# Patient Record
Sex: Male | Born: 1945 | Race: White | Hispanic: No | Marital: Married | Smoking: Never smoker
Health system: Southern US, Community
[De-identification: ages and names within clinical notes are randomized; demographics above are authoritative.]

## PROBLEM LIST (undated history)

## (undated) DIAGNOSIS — Z87442 Personal history of urinary calculi: Secondary | ICD-10-CM

## (undated) DIAGNOSIS — E079 Disorder of thyroid, unspecified: Secondary | ICD-10-CM

## (undated) DIAGNOSIS — H269 Unspecified cataract: Secondary | ICD-10-CM

## (undated) DIAGNOSIS — E039 Hypothyroidism, unspecified: Secondary | ICD-10-CM

## (undated) DIAGNOSIS — I10 Essential (primary) hypertension: Secondary | ICD-10-CM

## (undated) HISTORY — DX: Unspecified cataract: H26.9

## (undated) HISTORY — DX: Essential (primary) hypertension: I10

## (undated) HISTORY — PX: EYE SURGERY: SHX253

## (undated) HISTORY — DX: Disorder of thyroid, unspecified: E07.9

## (undated) HISTORY — PX: OTHER SURGICAL HISTORY: SHX169

---

## 1996-09-26 HISTORY — PX: HERNIA REPAIR: SHX51

## 2006-05-24 ENCOUNTER — Ambulatory Visit: Payer: Self-pay | Admitting: Unknown Physician Specialty

## 2007-06-28 ENCOUNTER — Ambulatory Visit: Payer: Self-pay

## 2007-06-28 ENCOUNTER — Other Ambulatory Visit: Payer: Self-pay

## 2009-06-17 ENCOUNTER — Ambulatory Visit: Payer: Self-pay | Admitting: Unknown Physician Specialty

## 2013-12-14 ENCOUNTER — Observation Stay: Payer: Self-pay | Admitting: Internal Medicine

## 2013-12-14 LAB — CBC WITH DIFFERENTIAL/PLATELET
BASOS PCT: 1.9 %
Basophil #: 0.2 10*3/uL — ABNORMAL HIGH (ref 0.0–0.1)
Eosinophil #: 0 10*3/uL (ref 0.0–0.7)
Eosinophil %: 0.1 %
HCT: 45.5 % (ref 40.0–52.0)
HGB: 15 g/dL (ref 13.0–18.0)
Lymphocyte #: 0.5 10*3/uL — ABNORMAL LOW (ref 1.0–3.6)
Lymphocyte %: 5.1 %
MCH: 27.8 pg (ref 26.0–34.0)
MCHC: 32.8 g/dL (ref 32.0–36.0)
MCV: 85 fL (ref 80–100)
MONO ABS: 0.9 x10 3/mm (ref 0.2–1.0)
MONOS PCT: 8.2 %
NEUTROS ABS: 9 10*3/uL — AB (ref 1.4–6.5)
Neutrophil %: 84.7 %
PLATELETS: 129 10*3/uL — AB (ref 150–440)
RBC: 5.38 10*6/uL (ref 4.40–5.90)
RDW: 14.6 % — ABNORMAL HIGH (ref 11.5–14.5)
WBC: 10.6 10*3/uL (ref 3.8–10.6)

## 2013-12-14 LAB — COMPREHENSIVE METABOLIC PANEL
ALT: 21 U/L (ref 12–78)
Albumin: 3.7 g/dL (ref 3.4–5.0)
Alkaline Phosphatase: 46 U/L
Anion Gap: 4 — ABNORMAL LOW (ref 7–16)
BUN: 20 mg/dL — ABNORMAL HIGH (ref 7–18)
Bilirubin,Total: 0.3 mg/dL (ref 0.2–1.0)
CHLORIDE: 108 mmol/L — AB (ref 98–107)
Calcium, Total: 8.3 mg/dL — ABNORMAL LOW (ref 8.5–10.1)
Co2: 26 mmol/L (ref 21–32)
Creatinine: 1.45 mg/dL — ABNORMAL HIGH (ref 0.60–1.30)
EGFR (African American): 58 — ABNORMAL LOW
EGFR (Non-African Amer.): 50 — ABNORMAL LOW
GLUCOSE: 113 mg/dL — AB (ref 65–99)
OSMOLALITY: 279 (ref 275–301)
Potassium: 3.9 mmol/L (ref 3.5–5.1)
SGOT(AST): 20 U/L (ref 15–37)
SODIUM: 138 mmol/L (ref 136–145)
Total Protein: 7 g/dL (ref 6.4–8.2)

## 2013-12-14 LAB — URINALYSIS, COMPLETE
Bacteria: NONE SEEN
Bilirubin,UR: NEGATIVE
Blood: NEGATIVE
Glucose,UR: NEGATIVE mg/dL (ref 0–75)
KETONE: NEGATIVE
LEUKOCYTE ESTERASE: NEGATIVE
NITRITE: NEGATIVE
Ph: 5 (ref 4.5–8.0)
Protein: NEGATIVE
RBC,UR: 2 /HPF (ref 0–5)
Specific Gravity: 1.021 (ref 1.003–1.030)
Squamous Epithelial: <1

## 2013-12-14 LAB — RAPID INFLUENZA A&B ANTIGENS (ARMC ONLY)

## 2013-12-14 LAB — TROPONIN I

## 2013-12-15 LAB — CBC WITH DIFFERENTIAL/PLATELET
BASOS ABS: 0 10*3/uL (ref 0.0–0.1)
BASOS PCT: 0.2 %
Eosinophil #: 0 10*3/uL (ref 0.0–0.7)
Eosinophil %: 0 %
HCT: 43.1 % (ref 40.0–52.0)
HGB: 13.9 g/dL (ref 13.0–18.0)
Lymphocyte #: 0.5 10*3/uL — ABNORMAL LOW (ref 1.0–3.6)
Lymphocyte %: 3.8 %
MCH: 27.3 pg (ref 26.0–34.0)
MCHC: 32.3 g/dL (ref 32.0–36.0)
MCV: 84 fL (ref 80–100)
MONO ABS: 0.9 x10 3/mm (ref 0.2–1.0)
Monocyte %: 7.6 %
Neutrophil #: 10.9 10*3/uL — ABNORMAL HIGH (ref 1.4–6.5)
Neutrophil %: 88.4 %
PLATELETS: 127 10*3/uL — AB (ref 150–440)
RBC: 5.11 10*6/uL (ref 4.40–5.90)
RDW: 15.1 % — ABNORMAL HIGH (ref 11.5–14.5)
WBC: 12.4 10*3/uL — AB (ref 3.8–10.6)

## 2013-12-15 LAB — BASIC METABOLIC PANEL
Anion Gap: 8 (ref 7–16)
BUN: 18 mg/dL (ref 7–18)
CO2: 23 mmol/L (ref 21–32)
Calcium, Total: 7.4 mg/dL — ABNORMAL LOW (ref 8.5–10.1)
Chloride: 107 mmol/L (ref 98–107)
Creatinine: 1.44 mg/dL — ABNORMAL HIGH (ref 0.60–1.30)
EGFR (African American): 58 — ABNORMAL LOW
EGFR (Non-African Amer.): 50 — ABNORMAL LOW
GLUCOSE: 107 mg/dL — AB (ref 65–99)
Osmolality: 278 (ref 275–301)
POTASSIUM: 3.5 mmol/L (ref 3.5–5.1)
Sodium: 138 mmol/L (ref 136–145)

## 2013-12-15 LAB — TSH: Thyroid Stimulating Horm: 0.555 u[IU]/mL

## 2013-12-19 LAB — CULTURE, BLOOD (SINGLE)

## 2015-01-17 NOTE — H&P (Signed)
PATIENT NAME:  Gregory Mckay, Gregory Mckay MR#:  [id number] DATE OF BIRTH:  14-Mar-1946  DATE OF ADMISSION:  12/14/2013  REFERRING PHYSICIAN: Dr. Benjaman Lobe.   FAMILY PHYSICIAN: Nonlocal.   REASON FOR ADMISSION: Dehydration with fever.   HISTORY OF PRESENT ILLNESS: The patient is a 69 year old male with a history of hypothyroidism and benign hypertension, who presented to the Emergency Room with fever and cough and shortness of breath for 2 days. In the Emergency Room, the patient was noted to be dehydrated and febrile with borderline hypoxia. Chest x-ray did not show an obvious infiltrate. Denies tobacco abuse. No history of asthma. He is now admitted for further evaluation.   PAST MEDICAL HISTORY: 1.  Hypothyroidism. 2.  Benign hypertension.   MEDICATIONS: 1.  Lotrel 5/10, 1 p.o. daily.  2.  Lasix 20 mg p.o. daily.  3.  Armour Thyroid 30 mg p.o. daily.   ALLERGIES: No known drug allergies.   SOCIAL HISTORY: The patient is married. No history of alcohol or tobacco abuse.   FAMILY HISTORY: Positive for hypertension, coronary artery disease, but otherwise unremarkable.   REVIEW OF SYSTEMS:    CONSTITUTIONAL: The patient has had fever but no change in weight.  EYES: No blurred or double vision. No glaucoma.  EARS, NOSE, THROAT: No tinnitus or hearing loss. No nasal discharge or bleeding. No difficulty swallowing.  RESPIRATORY: The patient has had cough and wheezing but no hemoptysis. No painful respiration.  CARDIOVASCULAR: No chest pain or orthopnea. No palpitations or syncope.  GASTROINTESTINAL: No nausea, vomiting,  or diarrhea. No abdominal pain. No change in bowel habits.  GENITOURINARY: No dysuria or hematuria. No incontinence.  ENDOCRINE: No polyuria or polydipsia. No heat or cold intolerance.  HEMATOLOGIC: The patient denies anemia, easy bruising or bleeding.  LYMPHATIC: No swollen glands.  MUSCULOSKELETAL: The patient denies pain in his neck, back, shoulders, knees, or hips. No gout.   NEUROLOGIC: No numbness or migraines. Denies stroke or seizures.  PSYCHIATRIC: The patient denies anxiety, insomnia, or depression.   PHYSICAL EXAMINATION: GENERAL: The patient is in no acute distress.  VITAL SIGNS: Initially remarkable for a blood pressure of 124/61 with a heart rate of 97,  respiratory rate of 18, temperature of 101.9.  HEENT: Normocephalic, atraumatic. Pupils equally round and reactive to light and accommodation. Extraocular movements are intact. Sclerae are anicteric. Conjunctivae are clear. Oropharynx is dry and erythematous without exudate.  NECK: Supple without adenopathy. No thyromegaly is noted.  LUNGS: Scattered rhonchi without wheezes or rales. No dullness. Respiratory effort is normal.  CARDIAC: Regular rate and rhythm. Normal S1, S2. No significant rubs, murmurs, or gallops. PMI is nondisplaced. Chest wall is nontender.  ABDOMEN: Soft, nontender, with normoactive bowel sounds. No organomegaly or masses were appreciated. No hernias or bruits were noted.  EXTREMITIES: Without clubbing, cyanosis, or edema. Pulses were 2+ bilaterally.  SKIN: Warm and dry without rash or lesions.  NEUROLOGIC: Cranial nerves II through XII grossly intact. Deep tendon reflexes were symmetric. Motor and sensory exams nonfocal.  PSYCHIATRIC: Revealed a patient who is alert and oriented to person, place, and time. He was cooperative and used good judgment.   LABORATORY, DIAGNOSTIC, AND RADIOLOGICAL DATA: EKG revealed sinus rhythm with no acute ischemic changes. Chest x-ray revealed some scarring or atelectasis in the lingular region with no obvious infiltrate. Flu swab negative. White count was 10.6 with a hemoglobin of 15.0. His glucose was 113 with a BUN of 20, creatinine of 1.45 with a GFR of 50.   ASSESSMENT:  1.  Acute respiratory distress.  2.  Presumed acute bronchitis.  3.  Borderline hypoxia.  4.  Dehydration.  5.  Acute renal insufficiency.  6.  Hyperglycemia.   PLAN: The  patient will be observed with IV fluids and IV antibiotics. Will send off sputum and blood cultures. Will use oxygen as needed for sats under 92%. Continue blood pressure medicine for now but hold Lasix. Follow-up chest x-ray and labs in the morning. Further treatment and evaluation will depend upon the patient's progress.   TOTAL TIME SPENT ON THIS PATIENT: 45 minutes.    ____________________________ Gregory Douglas Doy Hutching, MD jds:jcm D: 12/14/2013 13:32:17 ET T: 12/14/2013 13:57:51 ET JOB#: [id number]  cc: Gregory Douglas. Doy Hutching, MD, <Dictator> Gracee Ratterree Lennice Sites MD ELECTRONICALLY SIGNED 12/14/2013 15:28

## 2015-01-17 NOTE — Discharge Summary (Signed)
PATIENT NAME:  Gregory Mckay, Gregory Mckay MR#:  [id number] DATE OF BIRTH:  [birth year]  DATE OF ADMISSION:  12/14/2013 DATE OF DISCHARGE:  12/15/2013  PRIMARY CARE PHYSICIAN: Nonlocal.   FINAL DIAGNOSES: 1. Acute respiratory failure, resolved.  2. Systemic inflammatory response syndrome and bronchitis.  3. Renal insufficiency, unclear if acute versus chronic.  4. Hypertension.  5. Hypothyroidism.   MEDICATIONS ON DISCHARGE: Include Lotrel 5/10 mg 1 capsule daily, levofloxacin 500 mg 1 tablet daily for 9 more days, thyroid desiccated 30 mg 1 tablet daily, cough syrup chlorpheniramine/hydrocodone 8 mg/10 mg/5 mL oral suspension extended release 5 mL every 12 hours as needed for cough. Stop taking Lasix and creatine.  HOME HEALTH: None.   DIET: Low-sodium diet, regular consistency.   ACTIVITY: As tolerated.   FOLLOWUP: Follow up in 1 to 2 weeks with your medical doctor.   HOSPITAL COURSE: The patient was admitted 12/14/2013 and discharged 12/15/2013, was brought in for an observation for dehydration and fever, admitted with acute respiratory distress, borderline hypoxia, bronchitis, renal insufficiency.   LABORATORY AND RADIOLOGICAL DATA DURING THE HOSPITAL COURSE: Included a troponin that was negative. Glucose 113, BUN 20, creatinine 1.45. Sodium 138, potassium 3.9, chloride 108, CO2 of 26, calcium 8.3. Liver function tests normal range. White blood cell count 10.6, hemoglobin and hematocrit 15 and 45.5, platelet count of 129,000. Chest x-ray showed no abnormality. Influenza A and B negative. Blood cultures negative. Urinalysis negative. TSH 0.55. On 12/15/2013, pulse oximetry on room air 91%. White count upon discharge 12.4, hemoglobin 13.9, platelet count of 127,000, creatinine same at 1.44 with a BUN of 18.   HOSPITAL COURSE PER PROBLEM LIST:  1. Acute respiratory failure. The patient's breathing was fine. No hypoxia upon discharge, pulse oximetry 94% on upon ambulation upon discharge.  2. Systemic  inflammatory response syndrome with fever, white count, believed secondary to bronchitis. Finish up the course of Levaquin.  3. Renal insufficiency, unclear if acute versus chronic. Secondary to not improving much with IV fluid hydration overnight, recommend following up as outpatient. Hold Lasix and creatine at this point.  4. Hypertension. Lotrel 5/10 mg 1 tablet daily.  5. Hypothyroidism. The patient is on thyroid desiccated 30 mg daily. TSH in the normal range.   TIME SPENT ON DISCHARGE: 35 minutes.    ____________________________ Tana Conch. Leslye Peer, MD rjw:lm D: 12/15/2013 15:43:51 ET T: 12/16/2013 05:55:01 ET JOB#: [id number]  cc: Tana Conch. Leslye Peer, MD, <Dictator> Marisue Brooklyn MD ELECTRONICALLY SIGNED 12/20/2013 16:27

## 2015-02-05 ENCOUNTER — Encounter: Payer: Self-pay | Admitting: Emergency Medicine

## 2015-02-05 ENCOUNTER — Emergency Department
Admission: EM | Admit: 2015-02-05 | Discharge: 2015-02-05 | Disposition: A | Discharge status: Home / Self Care | Payer: Medicare Other | Attending: Emergency Medicine | Admitting: Emergency Medicine

## 2015-02-05 ENCOUNTER — Emergency Department: Payer: Medicare Other

## 2015-02-05 DIAGNOSIS — Z79899 Other long term (current) drug therapy: Secondary | ICD-10-CM | POA: Diagnosis not present

## 2015-02-05 DIAGNOSIS — N390 Urinary tract infection, site not specified: Secondary | ICD-10-CM | POA: Insufficient documentation

## 2015-02-05 DIAGNOSIS — K8020 Calculus of gallbladder without cholecystitis without obstruction: Secondary | ICD-10-CM | POA: Insufficient documentation

## 2015-02-05 DIAGNOSIS — R109 Unspecified abdominal pain: Secondary | ICD-10-CM | POA: Diagnosis present

## 2015-02-05 LAB — COMPREHENSIVE METABOLIC PANEL
ALT: 15 U/L — ABNORMAL LOW (ref 17–63)
AST: 18 U/L (ref 15–41)
Albumin: 4.3 g/dL (ref 3.5–5.0)
Alkaline Phosphatase: 52 U/L (ref 38–126)
Anion gap: 8 (ref 5–15)
BILIRUBIN TOTAL: 0.3 mg/dL (ref 0.3–1.2)
BUN: 27 mg/dL — ABNORMAL HIGH (ref 6–20)
CALCIUM: 9.7 mg/dL (ref 8.9–10.3)
CO2: 29 mmol/L (ref 22–32)
CREATININE: 1.46 mg/dL — AB (ref 0.61–1.24)
Chloride: 103 mmol/L (ref 101–111)
GFR calc Af Amer: 55 mL/min — ABNORMAL LOW (ref >60)
GFR, EST NON AFRICAN AMERICAN: 48 mL/min — AB (ref >60)
Glucose, Bld: 106 mg/dL — ABNORMAL HIGH (ref 65–99)
Potassium: 4.1 mmol/L (ref 3.5–5.1)
Sodium: 140 mmol/L (ref 135–145)
Total Protein: 7.4 g/dL (ref 6.5–8.1)

## 2015-02-05 LAB — CBC WITH DIFFERENTIAL/PLATELET
BASOS ABS: 0.1 10*3/uL (ref 0–0.1)
Basophils Relative: 1 %
Eosinophils Absolute: 0.1 10*3/uL (ref 0–0.7)
Eosinophils Relative: 1 %
HCT: 46.4 % (ref 40.0–52.0)
Hemoglobin: 14.9 g/dL (ref 13.0–18.0)
Lymphocytes Relative: 19 %
Lymphs Abs: 1.9 10*3/uL (ref 1.0–3.6)
MCH: 25.4 pg — ABNORMAL LOW (ref 26.0–34.0)
MCHC: 32 g/dL (ref 32.0–36.0)
MCV: 79.4 fL — ABNORMAL LOW (ref 80.0–100.0)
Monocytes Absolute: 0.9 10*3/uL (ref 0.2–1.0)
Monocytes Relative: 9 %
NEUTROS ABS: 6.9 10*3/uL — AB (ref 1.4–6.5)
NEUTROS PCT: 70 %
Platelets: 195 10*3/uL (ref 150–440)
RBC: 5.84 MIL/uL (ref 4.40–5.90)
RDW: 16 % — AB (ref 11.5–14.5)
WBC: 9.9 10*3/uL (ref 3.8–10.6)

## 2015-02-05 LAB — URINALYSIS COMPLETE WITH MICROSCOPIC (ARMC ONLY)
Bacteria, UA: NONE SEEN
Bilirubin Urine: NEGATIVE
Glucose, UA: NEGATIVE mg/dL
Hgb urine dipstick: NEGATIVE
Ketones, ur: NEGATIVE mg/dL
NITRITE: NEGATIVE
PH: 5 (ref 5.0–8.0)
PROTEIN: NEGATIVE mg/dL
SPECIFIC GRAVITY, URINE: 1.023 (ref 1.005–1.030)
Squamous Epithelial / LPF: NONE SEEN

## 2015-02-05 LAB — LIPASE, BLOOD: Lipase: 38 U/L (ref 22–51)

## 2015-02-05 MED ORDER — OXYCODONE-ACETAMINOPHEN 5-325 MG PO TABS: 1.0000 | ORAL_TABLET | Freq: Four times a day (QID) | ORAL | Status: DC | PRN

## 2015-02-05 MED ORDER — CIPROFLOXACIN HCL 500 MG PO TABS: 500.0000 mg | ORAL_TABLET | Freq: Two times a day (BID) | ORAL | Status: AC

## 2015-02-05 NOTE — ED Provider Notes (Signed)
[facility] Emergency Department Provider Note    Time seen: 1720  I have reviewed the triage vital signs and the nursing notes.   HISTORY  Chief Complaint Flank Pain    HPI Gregory Mckay is a 69 y.o. male who presents here for left flank pain for the last month. Then on and off, dull. Nothing makes it better particularly seems to get worse when he is sitting down at his desk or when he eats. He has not had any other complaints. Currently it is mild to moderate. It is intermittent and  severe     History reviewed. No pertinent past medical history.  There are no active problems to display for this patient.   History reviewed. No pertinent past surgical history.  Current Outpatient Rx  Name  Route  Sig  Dispense  Refill  . amLODipine-benazepril (LOTREL) 5-10 MG per capsule   Oral   Take 1 capsule by mouth daily.           Allergies Review of patient's allergies indicates no known allergies.  No family history on file.  Social History History  Substance Use Topics  . Smoking status: Never Smoker   . Smokeless tobacco: Not on file  . Alcohol Use: No    Review of Systems Constitutional: Negative for fever. Eyes: Negative for visual changes. ENT: Negative for sore throat. Cardiovascular: Negative for chest pain. Respiratory: Negative for shortness of breath. Gastrointestinal: Positive for left flank pain, negative for, vomiting and diarrhea. Genitourinary: Negative for dysuria. Musculoskeletal: Negative for back pain. Skin: Negative for rash. Neurological: Negative for headaches, focal weakness or numbness.  10-point ROS otherwise negative.  ____________________________________________   PHYSICAL EXAM:  VITAL SIGNS: ED Triage Vitals  Enc Vitals Group     BP 02/05/15 1433 148/78 mmHg     Pulse Rate 02/05/15 1433 73     Resp 02/05/15 1433 18     Temp 02/05/15 1433 98 F (36.7 C)     Temp Source 02/05/15 1433 Oral     SpO2  02/05/15 1433 95 %     Weight 02/05/15 1433 195 lb (88.451 kg)     Height 02/05/15 1433 5\' 11"  (1.803 m)     Head Cir --      Peak Flow --      Pain Score 02/05/15 1434 5     Pain Loc --      Pain Edu? --      Excl. in [place]? --     Constitutional: Alert and oriented. Well appearing and in no distress. Eyes: Conjunctivae are normal. PERRL. Normal extraocular movements. ENT   Head: Normocephalic and atraumatic.   Nose: No congestion/rhinnorhea.   Mouth/Throat: Mucous membranes are moist.   Neck: No stridor. Hematological/Lymphatic/Immunilogical: No cervical lymphadenopathy. Cardiovascular: Normal rate, regular rhythm. Normal and symmetric distal pulses are present in all extremities. No murmurs, rubs, or gallops. Respiratory: Normal respiratory effort without tachypnea nor retractions. Breath sounds are clear and equal bilaterally. No wheezes/rales/rhonchi. Gastrointestinal: Soft and nontender. No distention. No abdominal bruits. There is no CVA tenderness. Musculoskeletal: Nontender with normal range of motion in all extremities. No joint effusions.  No lower extremity tenderness nor edema. Neurologic:  Normal speech and language. No gross focal neurologic deficits are appreciated. Speech is normal. No gait instability. Skin:  Skin is warm, dry and intact. No rash noted. Psychiatric: Mood and affect are normal. Speech and behavior are normal. Patient exhibits appropriate insight and judgment.  ____________________________________________  LABS (pertinent positives/negatives)  Labs Reviewed  CBC WITH DIFFERENTIAL/PLATELET - Abnormal; Notable for the following:    MCV 79.4 (*)    MCH 25.4 (*)    RDW 16.0 (*)    Neutro Abs 6.9 (*)    All other components within normal limits  COMPREHENSIVE METABOLIC PANEL - Abnormal; Notable for the following:    Glucose, Bld 106 (*)    BUN 27 (*)    Creatinine, Ser 1.46 (*)    ALT 15 (*)    GFR calc non Af Amer 48 (*)    GFR calc  Af Amer 55 (*)    All other components within normal limits  URINALYSIS COMPLETEWITH MICROSCOPIC ([facility])  - Abnormal; Notable for the following:    Color, Urine YELLOW (*)    APPearance CLEAR (*)    Leukocytes, UA TRACE (*)    All other components within normal limits  LIPASE, BLOOD     ____________________________________________    RADIOLOGY  CT abdomen and pelvis with large gallstone as well as intrarenal stone, and some evidence of UTI and perinephric stranding.  ____________________________________________    ED COURSE  Pertinent labs & imaging results that were available during my care of the patient were reviewed by me and considered in my medical decision making (see chart for details).  Labs unremarkable except for some leukocytes in his urine.  FINAL ASSESSMENT AND PLAN  UTI, cholelithiasis, kidney stone  Plan: Discharge home with Percocet and Cipro, and close follow up with general surgeon mostly for this very large gallstone that we found. The infection should resolve with Cipro here.  Earleen Newport, MD   Earleen Newport, MD 02/05/15 [phone]

## 2015-02-05 NOTE — ED Notes (Signed)
Pt with left side flank pain for one month on and off. Worse in the afternoons after he eats.

## 2015-02-05 NOTE — Discharge Instructions (Signed)
Cholelithiasis Cholelithiasis (also called gallstones) is a form of gallbladder disease in which gallstones form in your gallbladder. The gallbladder is an organ that stores bile made in the liver, which helps digest fats. Gallstones begin as small crystals and slowly grow into stones. Gallstone pain occurs when the gallbladder spasms and a gallstone is blocking the duct. Pain can also occur when a stone passes out of the duct.  RISK FACTORS  Being male.   Having multiple pregnancies. Health care providers sometimes advise removing diseased gallbladders before future pregnancies.   Being obese.  Eating a diet heavy in fried foods and fat.   Being older than 90 years and increasing age.   Prolonged use of medicines containing male hormones.   Having diabetes mellitus.   Rapidly losing weight.   Having a family history of gallstones (heredity).  SYMPTOMS  Nausea.   Vomiting.  Abdominal pain.   Yellowing of the skin (jaundice).   Sudden pain. It may persist from several minutes to several hours.  Fever.   Tenderness to the touch. In some cases, when gallstones do not move into the bile duct, people have no pain or symptoms. These are called "silent" gallstones.  TREATMENT Silent gallstones do not need treatment. In severe cases, emergency surgery may be required. Options for treatment include:  Surgery to remove the gallbladder. This is the most common treatment.  Medicines. These do not always work and may take 6-12 months or more to work.  Shock wave treatment (extracorporeal biliary lithotripsy). In this treatment an ultrasound machine sends shock waves to the gallbladder to break gallstones into smaller pieces that can pass into the intestines or be dissolved by medicine. HOME CARE INSTRUCTIONS   Only take over-the-counter or prescription medicines for pain, discomfort, or fever as directed by your health care provider.   Follow a low-fat diet until  seen again by your health care provider. Fat causes the gallbladder to contract, which can result in pain.   Follow up with your health care provider as directed. Attacks are almost always recurrent and surgery is usually required for permanent treatment.  SEEK IMMEDIATE MEDICAL CARE IF:   Your pain increases and is not controlled by medicines.   You have a fever or persistent symptoms for more than 2-3 days.   You have a fever and your symptoms suddenly get worse.   You have persistent nausea and vomiting.  MAKE SURE YOU:   Understand these instructions.  Will watch your condition.  Will get help right away if you are not doing well or get worse. Document Released: 09/08/2005 Document Revised: 05/15/2013 Document Reviewed: 03/06/2013 [facility] Patient Information 2015 [place], [place]. This information is not intended to replace advice given to you by your health care provider. Make sure you discuss any questions you have with your health care provider.  Urinary Tract Infection Urinary tract infections (UTIs) can develop anywhere along your urinary tract. Your urinary tract is your body's drainage system for removing wastes and extra water. Your urinary tract includes two kidneys, two ureters, a bladder, and a urethra. Your kidneys are a pair of bean-shaped organs. Each kidney is about the size of your fist. They are located below your ribs, one on each side of your spine. CAUSES Infections are caused by microbes, which are microscopic organisms, including fungi, viruses, and bacteria. These organisms are so small that they can only be seen through a microscope. Bacteria are the microbes that most commonly cause UTIs. SYMPTOMS  Symptoms of  UTIs may vary by age and gender of the patient and by the location of the infection. Symptoms in young women typically include a frequent and intense urge to urinate and a painful, burning feeling in the bladder or urethra during urination. Older  women and men are more likely to be tired, shaky, and weak and have muscle aches and abdominal pain. A fever may mean the infection is in your kidneys. Other symptoms of a kidney infection include pain in your back or sides below the ribs, nausea, and vomiting. DIAGNOSIS To diagnose a UTI, your caregiver will ask you about your symptoms. Your caregiver also will ask to provide a urine sample. The urine sample will be tested for bacteria and white blood cells. White blood cells are made by your body to help fight infection. TREATMENT  Typically, UTIs can be treated with medication. Because most UTIs are caused by a bacterial infection, they usually can be treated with the use of antibiotics. The choice of antibiotic and length of treatment depend on your symptoms and the type of bacteria causing your infection. HOME CARE INSTRUCTIONS  If you were prescribed antibiotics, take them exactly as your caregiver instructs you. Finish the medication even if you feel better after you have only taken some of the medication.  Drink enough water and fluids to keep your urine clear or pale yellow.  Avoid caffeine, tea, and carbonated beverages. They tend to irritate your bladder.  Empty your bladder often. Avoid holding urine for long periods of time.  Empty your bladder before and after sexual intercourse.  After a bowel movement, women should cleanse from front to back. Use each tissue only once. SEEK MEDICAL CARE IF:   You have back pain.  You develop a fever.  Your symptoms do not begin to resolve within 3 days. SEEK IMMEDIATE MEDICAL CARE IF:   You have severe back pain or lower abdominal pain.  You develop chills.  You have nausea or vomiting.  You have continued burning or discomfort with urination. MAKE SURE YOU:   Understand these instructions.  Will watch your condition.  Will get help right away if you are not doing well or get worse. Document Released: 06/22/2005 Document  Revised: 03/13/2012 Document Reviewed: 10/21/2011 [facility] Patient Information 2015 [place], [place]. This information is not intended to replace advice given to you by your health care provider. Make sure you discuss any questions you have with your health care provider.

## 2015-03-10 ENCOUNTER — Ambulatory Visit: Payer: Self-pay

## 2015-12-07 DIAGNOSIS — I10 Essential (primary) hypertension: Secondary | ICD-10-CM | POA: Diagnosis not present

## 2015-12-07 DIAGNOSIS — R7989 Other specified abnormal findings of blood chemistry: Secondary | ICD-10-CM | POA: Diagnosis not present

## 2016-04-07 DIAGNOSIS — H27111 Subluxation of lens, right eye: Secondary | ICD-10-CM | POA: Diagnosis not present

## 2016-04-07 DIAGNOSIS — H5213 Myopia, bilateral: Secondary | ICD-10-CM | POA: Diagnosis not present

## 2016-04-07 DIAGNOSIS — H43812 Vitreous degeneration, left eye: Secondary | ICD-10-CM | POA: Diagnosis not present

## 2016-04-08 DIAGNOSIS — R972 Elevated prostate specific antigen [PSA]: Secondary | ICD-10-CM | POA: Diagnosis not present

## 2016-04-08 DIAGNOSIS — E7800 Pure hypercholesterolemia, unspecified: Secondary | ICD-10-CM | POA: Diagnosis not present

## 2016-04-08 DIAGNOSIS — R7989 Other specified abnormal findings of blood chemistry: Secondary | ICD-10-CM | POA: Diagnosis not present

## 2016-06-28 DIAGNOSIS — R7989 Other specified abnormal findings of blood chemistry: Secondary | ICD-10-CM | POA: Diagnosis not present

## 2016-06-28 DIAGNOSIS — E559 Vitamin D deficiency, unspecified: Secondary | ICD-10-CM | POA: Diagnosis not present

## 2016-06-28 DIAGNOSIS — Z131 Encounter for screening for diabetes mellitus: Secondary | ICD-10-CM | POA: Diagnosis not present

## 2016-06-28 DIAGNOSIS — E7800 Pure hypercholesterolemia, unspecified: Secondary | ICD-10-CM | POA: Diagnosis not present

## 2016-08-30 DIAGNOSIS — Z1283 Encounter for screening for malignant neoplasm of skin: Secondary | ICD-10-CM | POA: Diagnosis not present

## 2016-08-30 DIAGNOSIS — Z872 Personal history of diseases of the skin and subcutaneous tissue: Secondary | ICD-10-CM | POA: Diagnosis not present

## 2016-08-30 DIAGNOSIS — L570 Actinic keratosis: Secondary | ICD-10-CM | POA: Diagnosis not present

## 2016-08-30 DIAGNOSIS — Z09 Encounter for follow-up examination after completed treatment for conditions other than malignant neoplasm: Secondary | ICD-10-CM | POA: Diagnosis not present

## 2016-08-30 DIAGNOSIS — D1801 Hemangioma of skin and subcutaneous tissue: Secondary | ICD-10-CM | POA: Diagnosis not present

## 2016-08-30 DIAGNOSIS — L918 Other hypertrophic disorders of the skin: Secondary | ICD-10-CM | POA: Diagnosis not present

## 2016-08-30 DIAGNOSIS — L821 Other seborrheic keratosis: Secondary | ICD-10-CM | POA: Diagnosis not present

## 2016-08-30 DIAGNOSIS — D485 Neoplasm of uncertain behavior of skin: Secondary | ICD-10-CM | POA: Diagnosis not present

## 2016-12-20 DIAGNOSIS — Z23 Encounter for immunization: Secondary | ICD-10-CM | POA: Diagnosis not present

## 2017-01-03 DIAGNOSIS — E291 Testicular hypofunction: Secondary | ICD-10-CM | POA: Diagnosis not present

## 2017-01-04 DIAGNOSIS — E291 Testicular hypofunction: Secondary | ICD-10-CM | POA: Diagnosis not present

## 2017-01-19 DIAGNOSIS — E291 Testicular hypofunction: Secondary | ICD-10-CM | POA: Diagnosis not present

## 2017-01-19 DIAGNOSIS — Z79890 Hormone replacement therapy (postmenopausal): Secondary | ICD-10-CM | POA: Diagnosis not present

## 2017-01-19 DIAGNOSIS — R5382 Chronic fatigue, unspecified: Secondary | ICD-10-CM | POA: Diagnosis not present

## 2017-01-19 DIAGNOSIS — E039 Hypothyroidism, unspecified: Secondary | ICD-10-CM | POA: Diagnosis not present

## 2017-02-13 DIAGNOSIS — Z79890 Hormone replacement therapy (postmenopausal): Secondary | ICD-10-CM | POA: Diagnosis not present

## 2017-02-13 DIAGNOSIS — E291 Testicular hypofunction: Secondary | ICD-10-CM | POA: Diagnosis not present

## 2017-02-13 DIAGNOSIS — N189 Chronic kidney disease, unspecified: Secondary | ICD-10-CM | POA: Diagnosis not present

## 2017-02-13 DIAGNOSIS — D508 Other iron deficiency anemias: Secondary | ICD-10-CM | POA: Diagnosis not present

## 2017-02-14 DIAGNOSIS — N3001 Acute cystitis with hematuria: Secondary | ICD-10-CM | POA: Diagnosis not present

## 2017-02-14 DIAGNOSIS — R350 Frequency of micturition: Secondary | ICD-10-CM | POA: Diagnosis not present

## 2017-02-27 DIAGNOSIS — R319 Hematuria, unspecified: Secondary | ICD-10-CM | POA: Diagnosis not present

## 2017-02-27 DIAGNOSIS — R350 Frequency of micturition: Secondary | ICD-10-CM | POA: Diagnosis not present

## 2017-03-14 DIAGNOSIS — Z79890 Hormone replacement therapy (postmenopausal): Secondary | ICD-10-CM | POA: Diagnosis not present

## 2017-03-14 DIAGNOSIS — E291 Testicular hypofunction: Secondary | ICD-10-CM | POA: Diagnosis not present

## 2017-03-14 DIAGNOSIS — N189 Chronic kidney disease, unspecified: Secondary | ICD-10-CM | POA: Diagnosis not present

## 2017-03-14 DIAGNOSIS — N411 Chronic prostatitis: Secondary | ICD-10-CM | POA: Diagnosis not present

## 2017-03-16 DIAGNOSIS — R5381 Other malaise: Secondary | ICD-10-CM | POA: Diagnosis not present

## 2017-03-16 DIAGNOSIS — R5383 Other fatigue: Secondary | ICD-10-CM | POA: Diagnosis not present

## 2017-03-30 DIAGNOSIS — E291 Testicular hypofunction: Secondary | ICD-10-CM | POA: Diagnosis not present

## 2017-03-31 DIAGNOSIS — E291 Testicular hypofunction: Secondary | ICD-10-CM | POA: Diagnosis not present

## 2017-05-01 DIAGNOSIS — E291 Testicular hypofunction: Secondary | ICD-10-CM | POA: Diagnosis not present

## 2017-05-02 DIAGNOSIS — E291 Testicular hypofunction: Secondary | ICD-10-CM | POA: Diagnosis not present

## 2017-05-02 DIAGNOSIS — R6882 Decreased libido: Secondary | ICD-10-CM | POA: Diagnosis not present

## 2017-05-20 DIAGNOSIS — R799 Abnormal finding of blood chemistry, unspecified: Secondary | ICD-10-CM | POA: Diagnosis not present

## 2017-05-20 DIAGNOSIS — I10 Essential (primary) hypertension: Secondary | ICD-10-CM | POA: Diagnosis not present

## 2017-05-20 DIAGNOSIS — E291 Testicular hypofunction: Secondary | ICD-10-CM | POA: Diagnosis not present

## 2017-05-20 DIAGNOSIS — E039 Hypothyroidism, unspecified: Secondary | ICD-10-CM | POA: Diagnosis not present

## 2017-06-03 DIAGNOSIS — N400 Enlarged prostate without lower urinary tract symptoms: Secondary | ICD-10-CM | POA: Diagnosis not present

## 2017-06-03 DIAGNOSIS — E611 Iron deficiency: Secondary | ICD-10-CM | POA: Diagnosis not present

## 2017-06-03 DIAGNOSIS — R799 Abnormal finding of blood chemistry, unspecified: Secondary | ICD-10-CM | POA: Diagnosis not present

## 2017-07-04 DIAGNOSIS — E291 Testicular hypofunction: Secondary | ICD-10-CM | POA: Diagnosis not present

## 2017-07-05 DIAGNOSIS — R4586 Emotional lability: Secondary | ICD-10-CM | POA: Diagnosis not present

## 2017-07-05 DIAGNOSIS — R6882 Decreased libido: Secondary | ICD-10-CM | POA: Diagnosis not present

## 2017-07-05 DIAGNOSIS — G479 Sleep disorder, unspecified: Secondary | ICD-10-CM | POA: Diagnosis not present

## 2017-07-24 DIAGNOSIS — L738 Other specified follicular disorders: Secondary | ICD-10-CM | POA: Diagnosis not present

## 2017-09-05 DIAGNOSIS — E7210 Disorders of sulfur-bearing amino-acid metabolism, unspecified: Secondary | ICD-10-CM | POA: Diagnosis not present

## 2017-09-05 DIAGNOSIS — N411 Chronic prostatitis: Secondary | ICD-10-CM | POA: Diagnosis not present

## 2017-09-05 DIAGNOSIS — E291 Testicular hypofunction: Secondary | ICD-10-CM | POA: Diagnosis not present

## 2017-10-31 DIAGNOSIS — R4586 Emotional lability: Secondary | ICD-10-CM | POA: Diagnosis not present

## 2017-10-31 DIAGNOSIS — R6882 Decreased libido: Secondary | ICD-10-CM | POA: Diagnosis not present

## 2017-10-31 DIAGNOSIS — G479 Sleep disorder, unspecified: Secondary | ICD-10-CM | POA: Diagnosis not present

## 2017-11-01 DIAGNOSIS — R6882 Decreased libido: Secondary | ICD-10-CM | POA: Diagnosis not present

## 2017-11-01 DIAGNOSIS — E291 Testicular hypofunction: Secondary | ICD-10-CM | POA: Diagnosis not present

## 2018-01-05 DIAGNOSIS — N529 Male erectile dysfunction, unspecified: Secondary | ICD-10-CM | POA: Diagnosis not present

## 2018-01-05 DIAGNOSIS — E509 Vitamin A deficiency, unspecified: Secondary | ICD-10-CM | POA: Diagnosis not present

## 2018-01-05 DIAGNOSIS — E291 Testicular hypofunction: Secondary | ICD-10-CM | POA: Diagnosis not present

## 2018-01-05 DIAGNOSIS — E039 Hypothyroidism, unspecified: Secondary | ICD-10-CM | POA: Diagnosis not present

## 2018-02-05 DIAGNOSIS — E291 Testicular hypofunction: Secondary | ICD-10-CM | POA: Diagnosis not present

## 2018-02-05 DIAGNOSIS — Z79890 Hormone replacement therapy (postmenopausal): Secondary | ICD-10-CM | POA: Diagnosis not present

## 2018-02-05 LAB — PSA: PSA: 8.9

## 2018-02-05 LAB — CBC AND DIFFERENTIAL
HCT: 53 (ref 41–53)
HEMOGLOBIN: 17.8 — AB (ref 13.5–17.5)
Neutrophils Absolute: 3
PLATELETS: 188 (ref 150–399)
WBC: 5.6

## 2018-02-26 DIAGNOSIS — R6882 Decreased libido: Secondary | ICD-10-CM | POA: Diagnosis not present

## 2018-02-26 DIAGNOSIS — E291 Testicular hypofunction: Secondary | ICD-10-CM | POA: Diagnosis not present

## 2018-02-27 ENCOUNTER — Encounter: Payer: Self-pay | Admitting: Family Medicine

## 2018-02-27 ENCOUNTER — Ambulatory Visit (INDEPENDENT_AMBULATORY_CARE_PROVIDER_SITE_OTHER): Payer: Medicare Other | Admitting: Family Medicine

## 2018-02-27 VITALS — BP 164/78 | HR 65 | Temp 97.7°F | Resp 16 | Ht 71.0 in | Wt 196.0 lb

## 2018-02-27 DIAGNOSIS — R972 Elevated prostate specific antigen [PSA]: Secondary | ICD-10-CM | POA: Insufficient documentation

## 2018-02-27 DIAGNOSIS — E039 Hypothyroidism, unspecified: Secondary | ICD-10-CM | POA: Diagnosis not present

## 2018-02-27 DIAGNOSIS — Z23 Encounter for immunization: Secondary | ICD-10-CM | POA: Diagnosis not present

## 2018-02-27 DIAGNOSIS — I10 Essential (primary) hypertension: Secondary | ICD-10-CM | POA: Insufficient documentation

## 2018-02-27 MED ORDER — TADALAFIL 5 MG PO TABS: 5.0000 mg | ORAL_TABLET | Freq: Every day | ORAL | 11 refills | Status: DC

## 2018-02-27 MED ORDER — AMLODIPINE BESY-BENAZEPRIL HCL 5-10 MG PO CAPS: 1.0000 | ORAL_CAPSULE | Freq: Every day | ORAL | 3 refills | Status: DC

## 2018-02-27 NOTE — Progress Notes (Signed)
Patient: Gregory Mckay, Male    DOB: 11/08/1945, 72 y.o.   MRN: [id number] Visit Date: 02/27/2018  Today's Provider: Lavon Paganini, MD   I, Martha Clan, CMA, am acting as scribe for Lavon Paganini, MD.  Chief Complaint  Patient presents with  . Establish Care   Subjective:    Establish Care [name] is a 72 y.o. male who presents today to establish care. His previous PCP was Dr. Chilton Si at [facility]. His PMH includes HTN and hypothyroidism. He feels well. He reports exercising 2-3 times per week with a trainer. He reports he is sleeping well.  Hypothyroidism: Taking Armour Thyroid - unsure of dose.  Good compliance.  Asymptomatic. Thinks he had thyroid levels checked recently.  Has never taken Synthroid.  HTN: Taking amlodipine benazepril at current dose x 25 yrs.  No chest pain, SOB, headaches, vision changes, lower extremity edema.  Diet and exercise as above.  Believes his last creatinine was normal and this was within the last few months at his previous PCP.  S/p cataract surgery and Lasik.  [facility] in [place] gives testosterone pellets q4 months.  Seem to help with energy.  Testosterone level is close to 900s after receiving the pellets and then decreases to 700s.  He states this is when he knows he needs to get.  He brings a copy of recent lab results with him from 02/05/2018 that show PSA of 8.9.  Free PSA 1.51.  And percent free PSA 17% testosterone level 713 and free testosterone 21.2.  He also had other hormonal levels including growth hormone, IGF-I, estradiol, DHEA, dihydrotestosterone, sex hormone binding globulin, and pregnenolone checked.  He states that his PSA has been elevated to this level and stable there for many years.  He was seen by urology several years ago and they recommended a prostate biopsy which she declined.  He states that he will not see urology again and will never get a prostate biopsy.  He was told by his functional  medicine physician that because his percent free PSA is reasonably high at 17% that his risk of prostate cancer was rather small.  The results show that for this percentage free PSA in his age range, his probability of prostate cancer is 23%.  States that he has had 2 colonoscopies in the past.  He is unsure when his last one was.  He believes that his previous PCP will have records of these.  States he will not get another colonoscopy in the future.  Patient also brings a list of 41 different OTC supplements that he states he is taking for antiaging and antioxidant and anti-inflammatory properties.  Copy of these will be scanned into the chart. -----------------------------------------------------------------   Review of Systems  Constitutional: Negative.   HENT: Negative.   Eyes: Negative.   Respiratory: Negative.   Cardiovascular: Negative.   Gastrointestinal: Negative.   Endocrine: Negative.   Genitourinary: Negative.   Musculoskeletal: Negative.   Skin: Negative.   Allergic/Immunologic: Negative.   Neurological: Negative.   Hematological: Negative.   Psychiatric/Behavioral: Negative.     Social History      He  reports that he has never smoked. He has never used smokeless tobacco. He reports that he does not drink alcohol or use drugs.       Social History   Socioeconomic History  . Marital status: Married    Spouse name: Tommie  . Number of children: 2  .  Years of education: Not on file  . Highest education level: Not on file  Occupational History  . Not on file  Social Needs  . Financial resource strain: Not on file  . Food insecurity:    Worry: Not on file    Inability: Not on file  . Transportation needs:    Medical: Not on file    Non-medical: Not on file  Tobacco Use  . Smoking status: Never Smoker  . Smokeless tobacco: Never Used  Substance and Sexual Activity  . Alcohol use: No  . Drug use: No  . Sexual activity: Not on file  Lifestyle  . Physical  activity:    Days per week: Not on file    Minutes per session: Not on file  . Stress: Not on file  Relationships  . Social connections:    Talks on phone: Not on file    Gets together: Not on file    Attends religious service: Not on file    Active member of club or organization: Not on file    Attends meetings of clubs or organizations: Not on file    Relationship status: Not on file  Other Topics Concern  . Not on file  Social History Narrative  . Not on file    Past Medical History:  Diagnosis Date  . Cataract   . Hypertension   . Thyroid disease      There are no active problems to display for this patient.   Past Surgical History:  Procedure Laterality Date  . EYE SURGERY    . HERNIA REPAIR      Family History        Family Status  Relation Name Status  . Mother  (Not Specified)  . Father  (Not Specified)  . Neg Hx  (Not Specified)        His family history includes Healthy in his father and mother. There is no history of Colon cancer or Prostate cancer.      No Known Allergies   Current Outpatient Medications:  .  amLODipine-benazepril (LOTREL) 5-10 MG per capsule, Take 1 capsule by mouth daily., Disp: , Rfl:  .  ARMOUR THYROID PO, Take by mouth., Disp: , Rfl:    Patient Care Team: Virginia Crews, MD as PCP - General (Family Medicine)      Objective:   Vitals: BP (!) 164/78 (BP Location: Left Arm, Patient Position: Sitting, Cuff Size: Large)   Pulse 65   Temp 97.7 F (36.5 C) (Oral)   Resp 16   Ht 5\' 11"  (1.803 m)   Wt 196 lb (88.9 kg)   SpO2 98%   BMI 27.34 kg/m    Vitals:   02/27/18 0919  BP: (!) 164/78  Pulse: 65  Resp: 16  Temp: 97.7 F (36.5 C)  TempSrc: Oral  SpO2: 98%  Weight: 196 lb (88.9 kg)  Height: 5\' 11"  (1.803 m)     Physical Exam  Constitutional: He is oriented to person, place, and time. He appears well-developed and well-nourished. No distress.  HENT:  Head: Normocephalic and atraumatic.  Right Ear:  External ear normal.  Left Ear: External ear normal.  Nose: Nose normal.  Mouth/Throat: Oropharynx is clear and moist.  Eyes: Pupils are equal, round, and reactive to light. Conjunctivae and EOM are normal. No scleral icterus.  Neck: Neck supple. No thyromegaly present.  Cardiovascular: Normal rate, regular rhythm, normal heart sounds and intact distal pulses.  No murmur heard. Pulmonary/Chest: Effort  normal and breath sounds normal. No respiratory distress. He has no wheezes. He has no rales.  Abdominal: Soft. Bowel sounds are normal. He exhibits no distension. There is no tenderness. There is no rebound and no guarding.  Musculoskeletal: He exhibits no edema or deformity.  Lymphadenopathy:    He has no cervical adenopathy.  Neurological: He is alert and oriented to person, place, and time.  Skin: Skin is warm and dry. Capillary refill takes less than 2 seconds. No rash noted.  Psychiatric: He has a normal mood and affect. His behavior is normal.  Vitals reviewed.    Depression Screen PHQ 2/9 Scores 02/27/2018  PHQ - 2 Score 0     Assessment & Plan:    Problem List Items Addressed This Visit      Cardiovascular and Mediastinum   Hypertension - Primary    Uncontrolled today Believes that it is elevated due to this being first visit at new clinic  No changes made to [facility] today Patient believes that he had recent lab work within the last 1 to 2 months, so we will not check BMP today and await records from last PCP that we have requested Recheck at next visit and consider dose titration if needed at that point      Relevant Medications   amLODipine-benazepril (LOTREL) 5-10 MG capsule   tadalafil (CIALIS) 5 MG tablet     Endocrine   Hypothyroidism    Reviewed last TSH which was abstracted into the chart Unclear when patient was diagnosed with hypothyroidism He has never taken Synthroid We will continue his Armour Thyroid, but typically recommend Synthroid over this        Relevant Medications   ARMOUR THYROID PO     Other   Elevated PSA    Discussed with patient that an elevated PSA can be an indication for biopsy this could represent prostate cancer Patient insists that he will not see urology again and he will not undergo possible prostate biopsy discussed that if he does have prostate cancer, testosterone could feet this, but he insisted he will keep getting his testosterone pellets from his integrative medicine practice Plan to monitor PSA annually and if it greatly increases, will discuss again with patient the importance of seeing urology       Other Visit Diagnoses    Need for shingles vaccine       Relevant Orders   Varicella-zoster vaccine IM (Completed)   Need for pneumococcal vaccination       Relevant Orders   Pneumococcal polysaccharide vaccine 23-valent greater than or equal to 2yo subcutaneous/IM (Completed)       Return in about 6 months (around 08/29/2018) for CPE.   The entirety of the information documented in the History of Present Illness, Review of Systems and Physical Exam were personally obtained by me. Portions of this information were initially documented by Raquel Sarna Ratchford, CMA and reviewed by me for thoroughness and accuracy.    Virginia Crews, MD, MPH [facility] 03/01/2018 8:34 AM

## 2018-02-27 NOTE — Assessment & Plan Note (Addendum)
Uncontrolled today Believes that it is elevated due to this being first visit at new clinic  No changes made to [facility] today Patient believes that he had recent lab work within the last 1 to 2 months, so we will not check BMP today and await records from last PCP that we have requested Recheck at next visit and consider dose titration if needed at that point

## 2018-02-27 NOTE — Patient Instructions (Signed)

## 2018-03-01 DIAGNOSIS — R972 Elevated prostate specific antigen [PSA]: Secondary | ICD-10-CM | POA: Diagnosis not present

## 2018-03-01 NOTE — Assessment & Plan Note (Signed)
Discussed with patient that an elevated PSA can be an indication for biopsy this could represent prostate cancer Patient insists that he will not see urology again and he will not undergo possible prostate biopsy discussed that if he does have prostate cancer, testosterone could feet this, but he insisted he will keep getting his testosterone pellets from his integrative medicine practice Plan to monitor PSA annually and if it greatly increases, will discuss again with patient the importance of seeing urology

## 2018-03-01 NOTE — Assessment & Plan Note (Signed)
Reviewed last TSH which was abstracted into the chart Unclear when patient was diagnosed with hypothyroidism He has never taken Synthroid We will continue his Armour Thyroid, but typically recommend Synthroid over this

## 2018-03-03 ENCOUNTER — Telehealth: Payer: Self-pay

## 2018-03-03 NOTE — Telephone Encounter (Signed)
Sounds good.  Routing it back to you for call back next week.  Virginia Crews, MD, MPH [facility] 03/03/2018 9:35 AM

## 2018-03-03 NOTE — Telephone Encounter (Signed)
Tamika with [facility] [employer] called advising [place] that the PA for pt's Cialis has been denied. This medication is excluded from Medicare Part D if associated with eretile dysfunction. Would have been covered with a HTN diagnosis. Tamika states she will inform the pt that this has been denied, and that we will call him following up about this Monday.

## 2018-03-05 ENCOUNTER — Other Ambulatory Visit: Payer: Self-pay

## 2018-03-05 DIAGNOSIS — E039 Hypothyroidism, unspecified: Secondary | ICD-10-CM

## 2018-03-05 MED ORDER — THYROID 60 MG PO TABS: 60.0000 mg | ORAL_TABLET | Freq: Every day | ORAL | 1 refills | Status: DC

## 2018-03-05 NOTE — Telephone Encounter (Signed)
Received fax from after hours service. Needs refill of armor thyroid 60 mg po qd.

## 2018-03-05 NOTE — Telephone Encounter (Signed)
-----   Message from Ola Spurr sent at 03/05/2018  3:16 PM EDT ----- Review incoming fax

## 2018-03-06 NOTE — Telephone Encounter (Signed)
lmtcb

## 2018-03-15 NOTE — Telephone Encounter (Signed)
Pt has not returned call. [employer] was going to contact pt about this. He is seemingly willing to pay for the medication out of pocket.

## 2018-04-30 ENCOUNTER — Ambulatory Visit (INDEPENDENT_AMBULATORY_CARE_PROVIDER_SITE_OTHER): Payer: Medicare Other | Admitting: Family Medicine

## 2018-04-30 DIAGNOSIS — Z23 Encounter for immunization: Secondary | ICD-10-CM

## 2018-04-30 NOTE — Progress Notes (Signed)
Pt presents for 2nd Shingrix only.

## 2018-05-02 DIAGNOSIS — N5201 Erectile dysfunction due to arterial insufficiency: Secondary | ICD-10-CM | POA: Diagnosis not present

## 2018-05-02 DIAGNOSIS — Z79899 Other long term (current) drug therapy: Secondary | ICD-10-CM | POA: Diagnosis not present

## 2018-05-02 DIAGNOSIS — N401 Enlarged prostate with lower urinary tract symptoms: Secondary | ICD-10-CM | POA: Diagnosis not present

## 2018-05-02 DIAGNOSIS — E291 Testicular hypofunction: Secondary | ICD-10-CM | POA: Diagnosis not present

## 2018-05-08 DIAGNOSIS — N401 Enlarged prostate with lower urinary tract symptoms: Secondary | ICD-10-CM | POA: Diagnosis not present

## 2018-05-16 DIAGNOSIS — R972 Elevated prostate specific antigen [PSA]: Secondary | ICD-10-CM | POA: Diagnosis not present

## 2018-05-16 DIAGNOSIS — N401 Enlarged prostate with lower urinary tract symptoms: Secondary | ICD-10-CM | POA: Diagnosis not present

## 2018-05-17 ENCOUNTER — Other Ambulatory Visit (HOSPITAL_COMMUNITY): Payer: Self-pay | Admitting: Orthopedic Surgery

## 2018-05-17 DIAGNOSIS — R972 Elevated prostate specific antigen [PSA]: Secondary | ICD-10-CM

## 2018-05-22 DIAGNOSIS — N401 Enlarged prostate with lower urinary tract symptoms: Secondary | ICD-10-CM | POA: Diagnosis not present

## 2018-05-22 DIAGNOSIS — R972 Elevated prostate specific antigen [PSA]: Secondary | ICD-10-CM | POA: Diagnosis not present

## 2018-05-23 ENCOUNTER — Ambulatory Visit (HOSPITAL_COMMUNITY): Admission: RE | Admit: 2018-05-23 | Payer: Medicare Other | Source: Ambulatory Visit

## 2018-05-30 ENCOUNTER — Ambulatory Visit (HOSPITAL_COMMUNITY)
Admission: RE | Admit: 2018-05-30 | Discharge: 2018-05-30 | Disposition: A | Discharge status: Home / Self Care | Payer: Medicare Other | Source: Ambulatory Visit | Attending: Orthopedic Surgery | Admitting: Orthopedic Surgery

## 2018-05-30 DIAGNOSIS — N400 Enlarged prostate without lower urinary tract symptoms: Secondary | ICD-10-CM | POA: Diagnosis not present

## 2018-05-30 DIAGNOSIS — R972 Elevated prostate specific antigen [PSA]: Secondary | ICD-10-CM | POA: Diagnosis not present

## 2018-05-30 MED ORDER — GADOBENATE DIMEGLUMINE 529 MG/ML IV SOLN
20.0000 mL | Freq: Once | INTRAVENOUS | Status: AC | PRN
  Administered 2018-05-30: 19 mL via INTRAVENOUS

## 2018-06-05 DIAGNOSIS — N401 Enlarged prostate with lower urinary tract symptoms: Secondary | ICD-10-CM | POA: Diagnosis not present

## 2018-06-05 DIAGNOSIS — R972 Elevated prostate specific antigen [PSA]: Secondary | ICD-10-CM | POA: Diagnosis not present

## 2018-06-14 DIAGNOSIS — N401 Enlarged prostate with lower urinary tract symptoms: Secondary | ICD-10-CM | POA: Diagnosis not present

## 2018-06-14 DIAGNOSIS — R972 Elevated prostate specific antigen [PSA]: Secondary | ICD-10-CM | POA: Diagnosis not present

## 2018-06-20 DIAGNOSIS — R350 Frequency of micturition: Secondary | ICD-10-CM | POA: Diagnosis not present

## 2018-06-27 ENCOUNTER — Ambulatory Visit (HOSPITAL_COMMUNITY): Payer: Medicare Other

## 2018-07-03 DIAGNOSIS — R972 Elevated prostate specific antigen [PSA]: Secondary | ICD-10-CM | POA: Diagnosis not present

## 2018-07-03 DIAGNOSIS — N401 Enlarged prostate with lower urinary tract symptoms: Secondary | ICD-10-CM | POA: Diagnosis not present

## 2018-07-16 DIAGNOSIS — R319 Hematuria, unspecified: Secondary | ICD-10-CM | POA: Diagnosis not present

## 2018-07-16 DIAGNOSIS — R1030 Lower abdominal pain, unspecified: Secondary | ICD-10-CM | POA: Diagnosis not present

## 2018-07-18 DIAGNOSIS — R102 Pelvic and perineal pain: Secondary | ICD-10-CM | POA: Diagnosis not present

## 2018-07-18 DIAGNOSIS — N401 Enlarged prostate with lower urinary tract symptoms: Secondary | ICD-10-CM | POA: Diagnosis not present

## 2018-07-18 DIAGNOSIS — R972 Elevated prostate specific antigen [PSA]: Secondary | ICD-10-CM | POA: Diagnosis not present

## 2018-07-19 DIAGNOSIS — H524 Presbyopia: Secondary | ICD-10-CM | POA: Diagnosis not present

## 2018-07-20 ENCOUNTER — Telehealth: Payer: Self-pay

## 2018-07-20 DIAGNOSIS — Z1211 Encounter for screening for malignant neoplasm of colon: Secondary | ICD-10-CM

## 2018-07-20 NOTE — Telephone Encounter (Signed)
Patient calling wanting to pick up stool testing kit on Monday. He says Dr. B wanted him to have this done. Please leave up at the front desk. Thanks!

## 2018-07-23 NOTE — Telephone Encounter (Signed)
I think we must have briefly discussed cologuard.  Maybe he wants [place] to order that for him?  We can do that if he is interested.  Virginia Crews, MD, MPH [facility] 07/23/2018 8:50 AM

## 2018-07-23 NOTE — Telephone Encounter (Signed)
Patient advised. He agreed to proceed with Cologuard. Order placed and faxed to [employer].

## 2018-07-25 ENCOUNTER — Other Ambulatory Visit: Payer: Self-pay | Admitting: Family Medicine

## 2018-07-25 DIAGNOSIS — E039 Hypothyroidism, unspecified: Secondary | ICD-10-CM

## 2018-07-30 LAB — COLOGUARD

## 2018-08-01 ENCOUNTER — Encounter: Payer: Self-pay | Admitting: Emergency Medicine

## 2018-08-01 ENCOUNTER — Other Ambulatory Visit: Payer: Self-pay

## 2018-08-01 ENCOUNTER — Emergency Department
Admission: EM | Admit: 2018-08-01 | Discharge: 2018-08-01 | Discharge status: Against Medical Advice | Payer: Medicare Other | Attending: Emergency Medicine | Admitting: Emergency Medicine

## 2018-08-01 ENCOUNTER — Emergency Department: Payer: Medicare Other

## 2018-08-01 DIAGNOSIS — Z79899 Other long term (current) drug therapy: Secondary | ICD-10-CM | POA: Insufficient documentation

## 2018-08-01 DIAGNOSIS — R110 Nausea: Secondary | ICD-10-CM | POA: Diagnosis not present

## 2018-08-01 DIAGNOSIS — R7989 Other specified abnormal findings of blood chemistry: Secondary | ICD-10-CM | POA: Insufficient documentation

## 2018-08-01 DIAGNOSIS — R0789 Other chest pain: Secondary | ICD-10-CM | POA: Diagnosis not present

## 2018-08-01 DIAGNOSIS — R778 Other specified abnormalities of plasma proteins: Secondary | ICD-10-CM

## 2018-08-01 DIAGNOSIS — I10 Essential (primary) hypertension: Secondary | ICD-10-CM | POA: Insufficient documentation

## 2018-08-01 DIAGNOSIS — E039 Hypothyroidism, unspecified: Secondary | ICD-10-CM | POA: Diagnosis not present

## 2018-08-01 DIAGNOSIS — R079 Chest pain, unspecified: Secondary | ICD-10-CM | POA: Diagnosis not present

## 2018-08-01 LAB — CBC
HEMATOCRIT: 49.7 % (ref 39.0–52.0)
Hemoglobin: 15.2 g/dL (ref 13.0–17.0)
MCH: 25.1 pg — ABNORMAL LOW (ref 26.0–34.0)
MCHC: 30.6 g/dL (ref 30.0–36.0)
MCV: 82 fL (ref 80.0–100.0)
Platelets: 239 10*3/uL (ref 150–400)
RBC: 6.06 MIL/uL — AB (ref 4.22–5.81)
RDW: 14.5 % (ref 11.5–15.5)
WBC: 10.8 10*3/uL — ABNORMAL HIGH (ref 4.0–10.5)
nRBC: 0 % (ref 0.0–0.2)

## 2018-08-01 LAB — BASIC METABOLIC PANEL
ANION GAP: 8 (ref 5–15)
BUN: 25 mg/dL — ABNORMAL HIGH (ref 8–23)
CHLORIDE: 102 mmol/L (ref 98–111)
CO2: 29 mmol/L (ref 22–32)
Calcium: 9.7 mg/dL (ref 8.9–10.3)
Creatinine, Ser: 1.5 mg/dL — ABNORMAL HIGH (ref 0.61–1.24)
GFR calc Af Amer: 52 mL/min — ABNORMAL LOW (ref >60)
GFR calc non Af Amer: 45 mL/min — ABNORMAL LOW (ref >60)
Glucose, Bld: 92 mg/dL (ref 70–99)
POTASSIUM: 4.1 mmol/L (ref 3.5–5.1)
Sodium: 139 mmol/L (ref 135–145)

## 2018-08-01 LAB — TROPONIN I
Troponin I: 0.03 ng/mL (ref <0.03)
Troponin I: 0.05 ng/mL (ref <0.03)

## 2018-08-01 NOTE — Discharge Instructions (Signed)
Your troponin test was 0.05 today.  Please follow up with cardiology as soon as possible for further evaluation of this finding.  Return to the ER if you have any worsening symptoms including chest pain, trouble breathing, or other concerns.  Discontinue Cialis until you are able to see cardiology.

## 2018-08-01 NOTE — ED Notes (Signed)
Blood drawn and sent to the lab.

## 2018-08-01 NOTE — ED Notes (Addendum)
MD in to speak with pt about admission. Pt does not want to stay. Will sign out AMA. Explained to pt that AMA means we have advised him to stay but he has decided to leave and that if something happens to him once he leaves, we are not responsible as he was made aware. Advised pt that due to his troponin rising, there was indication that a cardiac related event occurred earlier. Pt verbalizes understanding.

## 2018-08-01 NOTE — ED Provider Notes (Signed)
[facility] Emergency Department Provider Note  ____________________________________________  Time seen: Approximately 2:21 PM  I have reviewed the triage vital signs and the nursing notes.   HISTORY  Chief Complaint Chest Pain    HPI Gregory Mckay is a 72 y.o. male with a history of hypertension, hypothyroidism, and sexual dysfunction who complains of an episode of chest discomfort that started this morning at about 8:00, lasted for an hour, resolved.  No associated shortness of breath diaphoresis or vomiting.  Nonradiating.  Unable to describe it in states that he would not characterize it as a "pain".  No dizziness or syncope.  Onset was after sexual intercourse this morning.  Reports having one other episode a few weeks ago under similar circumstances.  He does take a daily Cialis 5 mg.  No history of heart disease, does not have a cardiologist.  He also reports that he goes to the gym a few times a week, lifts weights without any episodes of chest discomfort or shortness of breath.  Also goes on walks a few times a week without any symptoms.   After symptoms started this morning he took 650 mg of aspirin.  Is taken a total of 975 mg of aspirin today so far.   Past Medical History:  Diagnosis Date  . Cataract   . Hypertension   . Thyroid disease      Patient Active Problem List   Diagnosis Date Noted  . Hypertension 02/27/2018  . Elevated PSA 02/27/2018  . Hypothyroidism 02/27/2018     Past Surgical History:  Procedure Laterality Date  . EYE SURGERY    . HERNIA REPAIR  1998   abdominal     Prior to Admission medications   Medication Sig Start Date End Date Taking? Authorizing Provider  Acetylcarnitine HCl (ACETYL L-CARNITINE PO) Take 2 g by mouth 2 (two) times daily. Taking at 4:00 am and 4:00 pm    [provider]  Acetylcysteine (N-ACETYL-L-CYSTEINE) 600 MG CAPS Take 1 capsule by mouth daily. Taking at 1:00 am    [provider]  Alpha-Lipoic Acid 600 MG CAPS Take 1 capsule by mouth 2 (two) times daily. Taking at 4:00 am and 4:00 pm.    [provider]  amLODipine-benazepril (LOTREL) 5-10 MG capsule Take 1 capsule by mouth daily. 02/27/18   Virginia Crews, MD  ARMOUR THYROID 60 MG tablet TAKE ONE TABLET EVERY DAY BEFORE BREAKFAST 07/25/18   Bacigalupo, Dionne Bucy, MD  Ashwagandha Extract 2.5 % POWD Take 470 mg by mouth 2 (two) times daily. Taking at 2:00 am and 2:00 pm.    [provider]  ASTAXANTHIN PO Take 12 mg by mouth daily. Taking at 1:00 ma    [provider]  beta carotene 25000 UNIT capsule Take 25,000 Units by mouth daily. Taking at 1:00 am    [provider]  Beta Glucan POWD Take 200 mg by mouth 2 (two) times daily. Taking at 1:00 am and 1:00 pm    [provider]  Biotin 5000 MCG CAPS Take 1 capsule by mouth daily. Taking at 1:00 am    [provider]  Carnosine POWD Take 500 mg by mouth 2 (two) times daily. Taking at 2:00 am and 1:00 pm.    [provider]  Cholecalciferol (VITAMIN D3) 10000 units TABS Take 1 tablet by mouth daily. Taking at 1:00 am    [provider]  Cinnamon 500 MG capsule Take 500 mg by mouth  2 (two) times daily. Taking at 1:00 am and 1:00 pm.    [provider]  CITICOLINE SODIUM PO Take 250 mg by mouth 2 (two) times daily. Taking at 1:00 am and 1:00 pm    [provider]  CITRULLINE PO Take by mouth daily. Taking at 3:00 pm.    [provider]  Coenzyme Q10 (COQ-10) 50 MG CAPS Take 50 mg by mouth daily. Taking at 1:00 am.    [provider]  DHEA 50 MG TABS Take 1 tablet by mouth daily. Taking at 1:00 am.    [provider]  Flaxseed, Linseed, (FLAX SEED OIL PO) Take 1,250 mg by mouth 2 (two) times daily. Flax Seed Plus Omega 3. Taking at 1:00 am and 1:00 pm.    [provider]  GARLIC PO Take 607 mg by mouth 2 (two) times daily. Aged Garlic  Extract. Taking at 2:00 am and 2:00 pm    [provider]  Ginkgo Biloba 40 MG TABS Take 120 mg by mouth 2 (two) times daily. Taking at 1:00 am and 1:00 pm.    [provider]  Glucosamine Sulfate 1000 MG TABS Take 1 tablet by mouth 2 (two) times daily. 1 tablet at 1:00 am, and 1 tablet at 1:00 pm    [provider]  Glutamine 500 MG CAPS Take 1 capsule by mouth 2 (two) times daily. Taking at 1:00 am and 1:00 pm    [provider]  Glycerylphosphorylcholine (ALPHA-GPC 50%) POWD Take 300 mg by mouth 2 (two) times daily. Taking at 1:00 am and 1:00 pm    [provider]  N[place]oka Cowden Tea, Camellia sinensis, (GREEN TEA EXTRACT PO) Take 750 mg by mouth daily. Taking at 1:00 am    [provider]  Gymnema Sylvestris Leaf POWD Take 400 mg by mouth 2 (two) times daily. Taking at 1:00 am and 1:00 pm    [provider]  Levomefolate Glucosamine (METHYLFOLATE PO) Take 1,000 mcg by mouth daily. Taking at 1:00 am.    [provider]  Lycopene 15 MG CAPS Take 1 capsule by mouth daily. Taking at 1:00 am.    [provider]  Menaquinone-7 (VITAMIN K2 PO) Take 2,700 mcg by mouth daily. Advanced K2 Complex. Taking at 1:00 am.    [provider]  milk thistle 175 MG tablet Take 175 mg by mouth 2 (two) times daily. Taking at 1:00 am and 1:00 pm    [provider]  Misc Natural Products (BETA-SITOSTEROL PLANT STEROLS) CAPS Take 60 mg by mouth 2 (two) times daily. Taking at 3:00 and 3:00 pm    [provider]  Misc Natural Products (PROSTATE HEALTH PO) Take 1 tablet by mouth 2 (two) times daily. Taking at 1:00 am and 1:00 pm    [provider]  Nutritional Supplements (PYCNOGENOL PO) Take 100 mg by mouth 2 (two) times daily. Taking at 1:00 am and 1:00 pm.    [provider]  OVER THE COUNTER MEDICATION Take 750 mg by mouth 2 (two) times daily. Curamed. Taking 1 tablet at 1:00 am, and 1 tablet at 1:00 pm.     [provider]  OVER THE COUNTER MEDICATION Take 300 mg by mouth 2 (two) times daily. Ellagic Active. Taking at 1:00 am and 1:00 pm.    [provider]  OVER THE COUNTER MEDICATION 2 g 2 (two) times daily. propionyl-l-carnitine. Taking at 4:00 am and 4:00 pm.    [provider]  OVER THE COUNTER MEDICATION Take 30 mg by mouth daily. Optizinc. Taking at 1:00 am    [provider]  OVER THE COUNTER MEDICATION Take 250,000 Units by mouth daily. Taking at 6:00 am    [provider]  Pantothenic Acid 500 MG TABS Take 1 tablet by mouth daily. Taking at 1:00 am    [provider]  PHOSPHATIDYLSERINE PO Take 300 mg by mouth daily. Taking at 1:00 am    [provider]  pyridoxine (B-6) 100 MG tablet Take 100 mg by mouth daily. Taking at 1:00 am.    [provider]  QUERCETIN PO Take by mouth 2 (two) times daily. Taking at 2:00 am and 2:00 pm.    [provider]  RESVERATROL PO Take 500 mg by mouth daily. Taking at 1:00 am.    [provider]  tadalafil (CIALIS) 5 MG tablet Take 1 tablet (5 mg total) by mouth daily. 02/27/18   Virginia Crews, MD  Tyrosine 500 MG TABS Take 1 tablet by mouth daily. Taking at 2:00 am    [provider]  vitamin E 400 UNIT capsule Take 400 Units by mouth daily. Taking at 1:00 am    [provider]  Vitamin Mixture (ESTER-C PO) Take 1,000 mg by mouth daily. Taking at 1:00 am    [provider]     Allergies Patient has no known allergies.   Family History  Problem Relation Age of Onset  . Healthy Mother   . Healthy Father   . Colon cancer Neg Hx   . Prostate cancer Neg Hx     Social History Social History   Tobacco Use  . Smoking status: Never Smoker  . Smokeless tobacco: Never Used  Substance Use Topics  . Alcohol use: No  . Drug use: No    Review of Systems  Constitutional:   No fever or chills.  ENT:   No sore throat. No  rhinorrhea. Cardiovascular: Positive chest discomfort as above.  No syncope. Respiratory:   No dyspnea or cough. Gastrointestinal:   Negative for abdominal pain, vomiting and diarrhea.  Musculoskeletal:   Negative for focal pain or swelling All other systems reviewed and are negative except as documented above in ROS and HPI.  ____________________________________________   PHYSICAL EXAM:  VITAL SIGNS: ED Triage Vitals  Enc Vitals Group     BP 08/01/18 1312 129/73     Pulse Rate 08/01/18 1312 85     Resp 08/01/18 1312 18     Temp 08/01/18 1312 97.7 F (36.5 C)     Temp Source 08/01/18 1312 Oral     SpO2 08/01/18 1312 98 %     Weight 08/01/18 1313 195 lb (88.5 kg)     Height 08/01/18 1313 5\' 11"  (1.803 m)     Head Circumference --      Peak Flow --      Pain Score 08/01/18 1312 4     Pain Loc --      Pain Edu? --      Excl. in [place]? --     Vital signs reviewed, nursing assessments reviewed.   Constitutional:   Alert and oriented. Non-toxic appearance. Eyes:   Conjunctivae are normal. EOMI. PERRL. ENT      Head:   Normocephalic and atraumatic.      Nose:   No congestion/rhinnorhea.       Mouth/Throat:   MMM, no pharyngeal erythema. No peritonsillar mass.  Neck:   No meningismus. Full ROM. Hematological/Lymphatic/Immunilogical:   No cervical lymphadenopathy. Cardiovascular:   RRR, with occasional skipped beats.. Symmetric bilateral radial and DP pulses.  No murmurs. Cap refill less than 2 seconds. Respiratory:   Normal respiratory effort without tachypnea/retractions. Breath sounds are clear and equal bilaterally. No wheezes/rales/rhonchi. Gastrointestinal:   Soft and nontender. Non distended. There is no CVA tenderness.  No rebound, rigidity, or guarding. Musculoskeletal:   Normal range of motion in all extremities. No joint effusions.  No lower extremity tenderness.  No edema.  Chest wall nontender Neurologic:   Normal speech and language.  Motor grossly intact. No  acute focal neurologic deficits are appreciated.  Skin:    Skin is warm, dry and intact. No rash noted.  No petechiae, purpura, or bullae.  ____________________________________________    LABS (pertinent positives/negatives) (all labs ordered are listed, but only abnormal results are displayed) Labs Reviewed  BASIC METABOLIC PANEL - Abnormal; Notable for the following components:      Result Value   BUN 25 (*)    Creatinine, Ser 1.50 (*)    GFR calc non Af Amer 45 (*)    GFR calc Af Amer 52 (*)    All other components within normal limits  CBC - Abnormal; Notable for the following components:   WBC 10.8 (*)    RBC 6.06 (*)    MCH 25.1 (*)    All other components within normal limits  TROPONIN I - Abnormal; Notable for the following components:   Troponin I 0.03 (*)    All other components within normal limits  TROPONIN I - Abnormal; Notable for the following components:   Troponin I 0.05 (*)    All other components within normal limits   ____________________________________________   EKG  Interpreted by me Sinus rhythm rate of 80, left axis, normal intervals.  Normal QRS, ST segments and T waves.  2 premature supraventricular contractions on the rhythm strip.  ____________________________________________    RADIOLOGY  Dg Chest 2 View  Result Date: 08/01/2018 CLINICAL DATA:  Onset chest pain today. EXAM: CHEST - 2 VIEW COMPARISON:  PA and lateral chest 12/14/2013. FINDINGS: Small focus of linear scar in the lingula is unchanged. Lungs otherwise clear. Heart size is upper normal. No pneumothorax or pleural effusion. No acute or focal bony abnormality. IMPRESSION: No acute disease. Electronically Signed   By: Inge Rise M.D.   On: 08/01/2018 13:47    ____________________________________________   PROCEDURES Procedures  ____________________________________________  DIFFERENTIAL DIAGNOSIS   Non-STEMI, Cialis side effect, GERD.  Doubt unstable angina,  pneumonia, pneumothorax, carditis.  CLINICAL IMPRESSION / ASSESSMENT AND PLAN / ED COURSE  Pertinent labs & imaging results that were available during my care of the patient were reviewed by me and considered in my medical decision making (see chart for details).    Nontoxic, normal vital signs, EKG unremarkable and unchanged from 2015.  Presents with atypical chest pain.  He has a degree of CKD as demonstrated by review of electronic medical record which I think is the reason why his troponin is slightly elevated at 0.03.  Doubt non-STEMI, suspect more an episode of mild angina.  Check a second troponin, if stable will plan to discharge to outpatient cardiology follow-up for further evaluation.  Recommended avoiding Cialis until he follows up with cardiology.  Clinical Course as of Aug 02 1615  Wed Aug 01, 2018  1612 Repeat troponin 0.05, nonreassuring.  No recurrent symptoms here in the ED.  Continues to have premature beats on the monitor.  Recommended hospitalization and further cardiac work-up and telemetry monitoring.  Patient refuses citing his schedule and being too busy to stay.  He has medical decision-making capacity will be signed out [place].  Advised him to continue taking low-dose aspirin daily until he follows up with cardiology.   [PS]    Clinical Course User Index [PS] Carrie Mew, MD     ____________________________________________   FINAL CLINICAL IMPRESSION(S) / ED DIAGNOSES    Final diagnoses:  Nonspecific chest pain  Elevated troponin     ED Discharge Orders    None      Portions of this note were generated with dragon dictation software. Dictation errors may occur despite best attempts at proofreading.    Carrie Mew, MD 08/01/18 1616

## 2018-08-01 NOTE — ED Triage Notes (Signed)
Pt presents from UC with chest pain today. States he has "not felt well" today and has chest pain. He did have one short episode of numbness in his right chin area that resolved after a short time (15-20 minutes). He also had a similar episode of the same last week. He took 3 x 325 aspiring this morning. Pt alert & oriented with NAD noted.

## 2018-08-03 DIAGNOSIS — I10 Essential (primary) hypertension: Secondary | ICD-10-CM | POA: Diagnosis not present

## 2018-08-03 DIAGNOSIS — E782 Mixed hyperlipidemia: Secondary | ICD-10-CM | POA: Diagnosis not present

## 2018-08-03 DIAGNOSIS — I208 Other forms of angina pectoris: Secondary | ICD-10-CM | POA: Diagnosis not present

## 2018-08-08 LAB — COLOGUARD: COLOGUARD: NEGATIVE

## 2018-08-09 DIAGNOSIS — I208 Other forms of angina pectoris: Secondary | ICD-10-CM | POA: Diagnosis not present

## 2018-08-13 DIAGNOSIS — R0789 Other chest pain: Secondary | ICD-10-CM | POA: Diagnosis not present

## 2018-08-13 DIAGNOSIS — I10 Essential (primary) hypertension: Secondary | ICD-10-CM | POA: Diagnosis not present

## 2018-08-13 DIAGNOSIS — E782 Mixed hyperlipidemia: Secondary | ICD-10-CM | POA: Diagnosis not present

## 2018-08-28 DIAGNOSIS — N401 Enlarged prostate with lower urinary tract symptoms: Secondary | ICD-10-CM | POA: Diagnosis not present

## 2018-08-29 ENCOUNTER — Ambulatory Visit (INDEPENDENT_AMBULATORY_CARE_PROVIDER_SITE_OTHER): Payer: Medicare Other

## 2018-08-29 VITALS — BP 130/64 | HR 74 | Temp 98.0°F | Ht 71.0 in | Wt 204.6 lb

## 2018-08-29 DIAGNOSIS — Z0000 Encounter for general adult medical examination without abnormal findings: Secondary | ICD-10-CM | POA: Diagnosis not present

## 2018-08-29 NOTE — Progress Notes (Signed)
Subjective:   Gregory Mckay is a 72 y.o. male who presents for an Initial Medicare Annual Wellness Visit.  Review of Systems  N/A  Cardiac Risk Factors include: advanced age (>16men, >36 women);dyslipidemia;hypertension;male gender    Objective:    Today's Vitals   08/29/18 1029  BP: 130/64  Pulse: 74  Temp: 98 F (36.7 C)  TempSrc: Oral  Weight: 204 lb 9.6 oz (92.8 kg)  Height: 5\' 11"  (1.803 m)  PainSc: 0-No pain   Body mass index is 28.54 kg/m.  Advanced Directives 08/29/2018 08/01/2018  Does Patient Have a Medical Advance Directive? No No  Would patient like information on creating a medical advance directive? No - Patient declined -    Current Medications (verified) Outpatient Encounter Medications as of 08/29/2018  Medication Sig  . Acetylcarnitine HCl (ACETYL L-CARNITINE PO) Take 2 g by mouth 2 (two) times daily. Taking at 4:00 am and 4:00 pm  . Alpha-Lipoic Acid 600 MG CAPS Take 1 capsule by mouth 2 (two) times daily. Taking at 4:00 am and 4:00 pm.  . amLODipine-benazepril (LOTREL) 5-10 MG capsule Take 1 capsule by mouth daily.  Francia Greaves THYROID 60 MG tablet TAKE ONE TABLET EVERY DAY BEFORE BREAKFAST  . ASTAXANTHIN PO Take 12 mg by mouth daily. Taking at 1:00 ma  . Beta Glucan POWD Take 200 mg by mouth 2 (two) times daily. Taking at 1:00 am and 1:00 pm  . Biotin 5000 MCG CAPS Take 1 capsule by mouth daily. Taking at 1:00 am  . Carnosine POWD Take 500 mg by mouth 2 (two) times daily. Taking at 2:00 am and 1:00 pm.  . Cholecalciferol (VITAMIN D3) 10000 units TABS Take 1 tablet by mouth daily. Taking at 1:00 am  . Cinnamon 500 MG capsule Take 500 mg by mouth 2 (two) times daily. Taking at 1:00 am and 1:00 pm.  . CITICOLINE SODIUM PO Take 250 mg by mouth 2 (two) times daily. Taking at 1:00 am and 1:00 pm  . Coenzyme Q10 (COQ-10) 50 MG CAPS Take 50 mg by mouth daily. Taking at 1:00 am.  . DHEA 50 MG TABS Take 1 tablet by mouth daily. Taking at 1:00 am.  . GARLIC PO  Take 093 mg by mouth 2 (two) times daily. Aged Garlic Extract. Taking at 2:00 am and 2:00 pm  . Glycerylphosphorylcholine (ALPHA-GPC 50%) POWD Take 300 mg by mouth 2 (two) times daily. Taking at 1:00 am and 1:00 pm  . Lycopene 15 MG CAPS Take 1 capsule by mouth daily. Taking at 1:00 am.  . milk thistle 175 MG tablet Take 175 mg by mouth 2 (two) times daily. Taking at 1:00 am and 1:00 pm  . Misc Natural Products (BETA-SITOSTEROL PLANT STEROLS) CAPS Take 60 mg by mouth 2 (two) times daily. Taking at 3:00 and 3:00 pm  . Misc Natural Products (PROSTATE HEALTH PO) Take 1 tablet by mouth 2 (two) times daily. Taking at 1:00 am and 1:00 pm  . Nutritional Supplements (PYCNOGENOL PO) Take 100 mg by mouth 2 (two) times daily. Taking at 1:00 am and 1:00 pm.  . OVER THE COUNTER MEDICATION Take 750 mg by mouth 2 (two) times daily. Curamed. Taking 1 tablet at 1:00 am, and 1 tablet at 1:00 pm.  . RESVERATROL PO Take 500 mg by mouth daily. Taking at 1:00 am.  . rosuvastatin (CRESTOR) 5 MG tablet Take 5 mg by mouth daily.  . tadalafil (CIALIS) 5 MG tablet Take 1 tablet (5 mg total)  by mouth daily.  . vitamin E 400 UNIT capsule Take 400 Units by mouth daily. Taking at 1:00 am  . Vitamin Mixture (ESTER-C PO) Take 1,000 mg by mouth daily. Taking at 1:00 am  . Acetylcysteine (N-ACETYL-L-CYSTEINE) 600 MG CAPS Take 1 capsule by mouth daily. Taking at 1:00 am  . Ashwagandha Extract 2.5 % POWD Take 470 mg by mouth 2 (two) times daily. Taking at 2:00 am and 2:00 pm.  . beta carotene 25000 UNIT capsule Take 25,000 Units by mouth daily. Taking at 1:00 am  . CITRULLINE PO Take by mouth daily. Taking at 3:00 pm.  . Flaxseed, Linseed, (FLAX SEED OIL PO) Take 1,250 mg by mouth 2 (two) times daily. Flax Seed Plus Omega 3. Taking at 1:00 am and 1:00 pm.  . Ginkgo Biloba 40 MG TABS Take 120 mg by mouth 2 (two) times daily. Taking at 1:00 am and 1:00 pm.  . Glucosamine Sulfate 1000 MG TABS Take 1 tablet by mouth 2 (two) times daily.  1 tablet at 1:00 am, and 1 tablet at 1:00 pm  . Glutamine 500 MG CAPS Take 1 capsule by mouth 2 (two) times daily. Taking at 1:00 am and 1:00 pm  . Nyoka Cowden Tea, Camellia sinensis, (GREEN TEA EXTRACT PO) Take 750 mg by mouth daily. Taking at 1:00 am  . Gymnema Sylvestris Leaf POWD Take 400 mg by mouth 2 (two) times daily. Taking at 1:00 am and 1:00 pm  . Levomefolate Glucosamine (METHYLFOLATE PO) Take 1,000 mcg by mouth daily. Taking at 1:00 am.  . Menaquinone-7 (VITAMIN K2 PO) Take 2,700 mcg by mouth daily. Advanced K2 Complex. Taking at 1:00 am.  . OVER THE COUNTER MEDICATION Take 300 mg by mouth 2 (two) times daily. Ellagic Active. Taking at 1:00 am and 1:00 pm.  . OVER THE COUNTER MEDICATION 2 g 2 (two) times daily. propionyl-l-carnitine. Taking at 4:00 am and 4:00 pm.  . OVER THE COUNTER MEDICATION Take 30 mg by mouth daily. Optizinc. Taking at 1:00 am  . OVER THE COUNTER MEDICATION Take 250,000 Units by mouth daily. Taking at 6:00 am  . Pantothenic Acid 500 MG TABS Take 1 tablet by mouth daily. Taking at 1:00 am  . PHOSPHATIDYLSERINE PO Take 300 mg by mouth daily. Taking at 1:00 am  . pyridoxine (B-6) 100 MG tablet Take 100 mg by mouth daily. Taking at 1:00 am.  . QUERCETIN PO Take by mouth 2 (two) times daily. Taking at 2:00 am and 2:00 pm.  . Tyrosine 500 MG TABS Take 1 tablet by mouth daily. Taking at 2:00 am   No facility-administered encounter medications on file as of 08/29/2018.     Allergies (verified) Patient has no known allergies.   History: Past Medical History:  Diagnosis Date  . Cataract   . Hypertension   . Thyroid disease    Past Surgical History:  Procedure Laterality Date  . EYE SURGERY    . HERNIA REPAIR  1998   abdominal   Family History  Problem Relation Age of Onset  . Healthy Mother   . Healthy Father   . Colon cancer Neg Hx   . Prostate cancer Neg Hx    Social History   Socioeconomic History  . Marital status: Married    Spouse name: Tommie  .  Number of children: 2  . Years of education: Not on file  . Highest education level: Associate degree: occupational, Hotel manager, or vocational program  Occupational History  . Occupation: President/CEO  Comment: [employer]  Social Needs  . Financial resource strain: Not hard at all  . Food insecurity:    Worry: Never true    Inability: Never true  . Transportation needs:    Medical: No    Non-medical: No  Tobacco Use  . Smoking status: Never Smoker  . Smokeless tobacco: Never Used  Substance and Sexual Activity  . Alcohol use: No  . Drug use: No  . Sexual activity: Yes    Partners: Female  Lifestyle  . Physical activity:    Days per week: 2 days    Minutes per session: 60 min  . Stress: Not at all  Relationships  . Social connections:    Talks on phone: Patient refused    Gets together: Patient refused    Attends religious service: Patient refused    Active member of club or organization: Patient refused    Attends meetings of clubs or organizations: Patient refused    Relationship status: Patient refused  Other Topics Concern  . Not on file  Social History Narrative  . Not on file   Tobacco Counseling Counseling given: Not Answered   Clinical Intake:     Pain Score: 0-No pain                 Activities of Daily Living In your present state of health, do you have any difficulty performing the following activities: 08/29/2018 02/27/2018  Hearing? N N  Vision? N N  Difficulty concentrating or making decisions? N N  Walking or climbing stairs? N N  Dressing or bathing? N N  Doing errands, shopping? N N  Preparing Food and eating ? N -  Using the Toilet? N -  In the past six months, have you accidently leaked urine? N -  Do you have problems with loss of bowel control? N -  Managing your Medications? N -  Managing your Finances? N -  Housekeeping or managing your Housekeeping? N -  Some recent data might be hidden       Immunizations and Health Maintenance Immunization History  Administered Date(s) Administered  . Pneumococcal Conjugate-13 12/20/2016  . Pneumococcal Polysaccharide-23 02/27/2018  . Zoster Recombinat (Shingrix) 02/27/2018, 04/30/2018   Health Maintenance Due  Topic Date Due  . Hepatitis C Screening  08-09-1946  . TETANUS/TDAP  01/26/1966  . INFLUENZA VACCINE  04/26/2018    Patient Care Team: Virginia Crews, MD as PCP - General (Family Medicine) Corey Skains, MD as Consulting Physician (Cardiology) Royston Cowper, MD as Consulting Physician (Urology)  Indicate any recent Medical Services you may have received from other than [facility] providers in the past year (date may be approximate).    Assessment:   This is a routine wellness examination for Elmin.  Hearing/Vision screen No exam data present  Dietary issues and exercise activities discussed: Current Exercise Habits: Structured exercise class, Type of exercise: strength training/weights, Time (Minutes): 60, Frequency (Times/Week): 2, Weekly Exercise (Minutes/Week): 120, Intensity: Moderate, Exercise limited by: None identified  Goals    . DIET - INCREASE WATER INTAKE     Recommend to drink at least 6-8 8oz glasses of water per day.       Depression Screen PHQ 2/9 Scores 08/29/2018 02/27/2018  PHQ - 2 Score 0 0    Fall Risk Fall Risk  08/29/2018 02/27/2018  Falls in the past year? 0 No    FALL RISK PREVENTION PERTAINING TO THE HOME:  Any stairs in or around  the home WITH handrails? Yes  Home free of loose throw rugs in walkways, pet beds, electrical cords, etc? Yes  Adequate lighting in your home to reduce risk of falls? Yes   ASSISTIVE DEVICES UTILIZED TO PREVENT FALLS:  Life alert? No  Use of a cane, walker or w/c? No  Grab bars in the bathroom? No  Shower chair or bench in shower? No  Elevated toilet seat or a handicapped toilet? No    TIMED UP AND GO:  Was the test performed? No .     Cognitive Function: Declined today.         Screening Tests Health Maintenance  Topic Date Due  . Hepatitis C Screening  1946-01-04  . TETANUS/TDAP  01/26/1966  . INFLUENZA VACCINE  04/26/2018  . Fecal DNA (Cologuard)  08/08/2021  . PNA vac Low Risk Adult  Completed    Qualifies for Shingles Vaccine? Up to date  Tdap: Although this vaccine is not a covered service during a Wellness Exam, does the patient still wish to receive this vaccine today?  No .  Education has been provided regarding the importance of this vaccine. Advised may receive this vaccine at local pharmacy or Health Dept. Aware to provide a copy of the vaccination record if obtained from local pharmacy or Health Dept. Verbalized acceptance and understanding.  Flu Vaccine: Due for Flu vaccine. Does the patient want to receive this vaccine today?  No . Education has been provided regarding the importance of this vaccine but still declined. Advised may receive this vaccine at local pharmacy or Health Dept. Aware to provide a copy of the vaccination record if obtained from local pharmacy or Health Dept. Verbalized acceptance and understanding.  Pneumococcal Vaccine: Up to date  Cancer Screenings:  Colorectal Screening: Cologuard completed 08/08/18. Repeat every 3 years.  Lung Cancer Screening: (Low Dose CT Chest recommended if Age 36-80 years, 30 pack-year currently smoking OR have quit w/in 15years.) does not qualify.    Additional Screening:  Hepatitis C Screening: does qualify; however declines order.   Vision Screening: Recommended annual ophthalmology exams for early detection of glaucoma and other disorders of the eye.  Dental Screening: Recommended annual dental exams for proper oral hygiene  Community Resource Referral:  CRR required this visit?  No        Plan:  I have personally reviewed and addressed the Medicare Annual Wellness questionnaire and have noted the following in the patient's  chart:  A. Medical and social history B. Use of alcohol, tobacco or illicit drugs  C. Current medications and supplements D. Functional ability and status E.  Nutritional status F.  Physical activity G. Advance directives H. List of other physicians I.  Hospitalizations, surgeries, and ER visits in previous 12 months J.  [place] such as hearing and vision if needed, cognitive and depression L. Referrals and appointments - none  In addition, I have reviewed and discussed with patient certain preventive protocols, quality metrics, and best practice recommendations. A written personalized care plan for preventive services as well as general preventive health recommendations were provided to patient.  See attached scanned questionnaire for additional information.   Signed,  Fabio Neighbors, LPN Nurse Health Advisor   Nurse Recommendations: Pt declined the Hep C lab order, tetanus vaccine and influenza vaccine today.

## 2018-08-29 NOTE — Patient Instructions (Signed)
Gregory Mckay , Thank you for taking time to come for your Medicare Wellness Visit. I appreciate your ongoing commitment to your health goals. Please review the following plan we discussed and let me know if I can assist you in the future.   Screening recommendations/referrals: Colonoscopy: Up to date, Cologuard due 08/08/21 Recommended yearly ophthalmology/optometry visit for glaucoma screening and checkup Recommended yearly dental visit for hygiene and checkup  Vaccinations: Influenza vaccine: Pt declines today.  Pneumococcal vaccine: Completed series Tdap vaccine: Pt declines today.  Shingles vaccine: Completed series    Advanced directives: Advance directive discussed with you today. Even though you declined this today please call our office should you change your mind and we can give you the proper paperwork for you to fill out.  Conditions/risks identified: Recommend to drink at least 6-8 8oz glasses of water per day.   Next appointment: 09/05/18 @ 9:00 AM with Dr Brita Romp. Declined scheduling the AWV for 2020 at this time.   Preventive Care 33 Years and Older, Male Preventive care refers to lifestyle choices and visits with your health care provider that can promote health and wellness. What does preventive care include?  A yearly physical exam. This is also called an annual well check.  Dental exams once or twice a year.  Routine eye exams. Ask your health care provider how often you should have your eyes checked.  Personal lifestyle choices, including:  Daily care of your teeth and gums.  Regular physical activity.  Eating a healthy diet.  Avoiding tobacco and drug use.  Limiting alcohol use.  Practicing safe sex.  Taking low doses of aspirin every day.  Taking vitamin and mineral supplements as recommended by your health care provider. What happens during an annual well check? The services and screenings done by your health care provider during your annual  well check will depend on your age, overall health, lifestyle risk factors, and family history of disease. Counseling  Your health care provider may ask you questions about your:  Alcohol use.  Tobacco use.  Drug use.  Emotional well-being.  Home and relationship well-being.  Sexual activity.  Eating habits.  History of falls.  Memory and ability to understand (cognition).  Work and work Statistician. Screening  You may have the following tests or measurements:  Height, weight, and BMI.  Blood pressure.  Lipid and cholesterol levels. These may be checked every 5 years, or more frequently if you are over 73 years old.  Skin check.  Lung cancer screening. You may have this screening every year starting at age 60 if you have a 30-pack-year history of smoking and currently smoke or have quit within the past 15 years.  Fecal occult blood test (FOBT) of the stool. You may have this test every year starting at age 1.  Flexible sigmoidoscopy or colonoscopy. You may have a sigmoidoscopy every 5 years or a colonoscopy every 10 years starting at age 44.  Prostate cancer screening. Recommendations will vary depending on your family history and other risks.  Hepatitis C blood test.  Hepatitis B blood test.  Sexually transmitted disease (STD) testing.  Diabetes screening. This is done by checking your blood sugar (glucose) after you have not eaten for a while (fasting). You may have this done every 1-3 years.  Abdominal aortic aneurysm (AAA) screening. You may need this if you are a current or former smoker.  Osteoporosis. You may be screened starting at age 17 if you are at high risk. Talk with  your health care provider about your test results, treatment options, and if necessary, the need for more tests. Vaccines  Your health care provider may recommend certain vaccines, such as:  Influenza vaccine. This is recommended every year.  Tetanus, diphtheria, and acellular  pertussis (Tdap, Td) vaccine. You may need a Td booster every 10 years.  Zoster vaccine. You may need this after age 54.  Pneumococcal 13-valent conjugate (PCV13) vaccine. One dose is recommended after age 7.  Pneumococcal polysaccharide (PPSV23) vaccine. One dose is recommended after age 34. Talk to your health care provider about which screenings and vaccines you need and how often you need them. This information is not intended to replace advice given to you by your health care provider. Make sure you discuss any questions you have with your health care provider. Document Released: 10/09/2015 Document Revised: 06/01/2016 Document Reviewed: 07/14/2015 Elsevier Interactive Patient Education  2017 [employer] Prevention in the Home Falls can cause injuries. They can happen to people of all ages. There are many things you can do to make your home safe and to help prevent falls. What can I do on the outside of my home?  Regularly fix the edges of walkways and driveways and fix any cracks.  Remove anything that might make you trip as you walk through a door, such as a raised step or threshold.  Trim any bushes or trees on the path to your home.  Use bright outdoor lighting.  Clear any walking paths of anything that might make someone trip, such as rocks or tools.  Regularly check to see if handrails are loose or broken. Make sure that both sides of any steps have handrails.  Any raised decks and porches should have guardrails on the edges.  Have any leaves, snow, or ice cleared regularly.  Use sand or salt on walking paths during winter.  Clean up any spills in your garage right away. This includes oil or grease spills. What can I do in the bathroom?  Use night lights.  Install grab bars by the toilet and in the tub and shower. Do not use towel bars as grab bars.  Use non-skid mats or decals in the tub or shower.  If you need to sit down in the shower, use a plastic,  non-slip stool.  Keep the floor dry. Clean up any water that spills on the floor as soon as it happens.  Remove soap buildup in the tub or shower regularly.  Attach bath mats securely with double-sided non-slip rug tape.  Do not have throw rugs and other things on the floor that can make you trip. What can I do in the bedroom?  Use night lights.  Make sure that you have a light by your bed that is easy to reach.  Do not use any sheets or blankets that are too big for your bed. They should not hang down onto the floor.  Have a firm chair that has side arms. You can use this for support while you get dressed.  Do not have throw rugs and other things on the floor that can make you trip. What can I do in the kitchen?  Clean up any spills right away.  Avoid walking on wet floors.  Keep items that you use a lot in easy-to-reach places.  If you need to reach something above you, use a strong step stool that has a grab bar.  Keep electrical cords out of the way.  Do not  use floor polish or wax that makes floors slippery. If you must use wax, use non-skid floor wax.  Do not have throw rugs and other things on the floor that can make you trip. What can I do with my stairs?  Do not leave any items on the stairs.  Make sure that there are handrails on both sides of the stairs and use them. Fix handrails that are broken or loose. Make sure that handrails are as long as the stairways.  Check any carpeting to make sure that it is firmly attached to the stairs. Fix any carpet that is loose or worn.  Avoid having throw rugs at the top or bottom of the stairs. If you do have throw rugs, attach them to the floor with carpet tape.  Make sure that you have a light switch at the top of the stairs and the bottom of the stairs. If you do not have them, ask someone to add them for you. What else can I do to help prevent falls?  Wear shoes that:  Do not have high heels.  Have rubber  bottoms.  Are comfortable and fit you well.  Are closed at the toe. Do not wear sandals.  If you use a stepladder:  Make sure that it is fully opened. Do not climb a closed stepladder.  Make sure that both sides of the stepladder are locked into place.  Ask someone to hold it for you, if possible.  Clearly mark and make sure that you can see:  Any grab bars or handrails.  First and last steps.  Where the edge of each step is.  Use tools that help you move around (mobility aids) if they are needed. These include:  Canes.  Walkers.  Scooters.  Crutches.  Turn on the lights when you go into a dark area. Replace any light bulbs as soon as they burn out.  Set up your furniture so you have a clear path. Avoid moving your furniture around.  If any of your floors are uneven, fix them.  If there are any pets around you, be aware of where they are.  Review your medicines with your doctor. Some medicines can make you feel dizzy. This can increase your chance of falling. Ask your doctor what other things that you can do to help prevent falls. This information is not intended to replace advice given to you by your health care provider. Make sure you discuss any questions you have with your health care provider. Document Released: 07/09/2009 Document Revised: 02/18/2016 Document Reviewed: 10/17/2014 Elsevier Interactive Patient Education  2017 [employer].

## 2018-09-05 ENCOUNTER — Ambulatory Visit (INDEPENDENT_AMBULATORY_CARE_PROVIDER_SITE_OTHER): Payer: Medicare Other | Admitting: Family Medicine

## 2018-09-05 ENCOUNTER — Encounter: Payer: Self-pay | Admitting: Family Medicine

## 2018-09-05 VITALS — BP 156/80 | HR 68 | Temp 98.0°F | Resp 16 | Ht 71.0 in | Wt 200.0 lb

## 2018-09-05 DIAGNOSIS — E663 Overweight: Secondary | ICD-10-CM

## 2018-09-05 DIAGNOSIS — Z1159 Encounter for screening for other viral diseases: Secondary | ICD-10-CM

## 2018-09-05 DIAGNOSIS — E785 Hyperlipidemia, unspecified: Secondary | ICD-10-CM

## 2018-09-05 DIAGNOSIS — L309 Dermatitis, unspecified: Secondary | ICD-10-CM | POA: Insufficient documentation

## 2018-09-05 DIAGNOSIS — R7989 Other specified abnormal findings of blood chemistry: Secondary | ICD-10-CM

## 2018-09-05 DIAGNOSIS — R972 Elevated prostate specific antigen [PSA]: Secondary | ICD-10-CM | POA: Diagnosis not present

## 2018-09-05 DIAGNOSIS — I10 Essential (primary) hypertension: Secondary | ICD-10-CM | POA: Diagnosis not present

## 2018-09-05 DIAGNOSIS — Z0000 Encounter for general adult medical examination without abnormal findings: Secondary | ICD-10-CM

## 2018-09-05 DIAGNOSIS — E039 Hypothyroidism, unspecified: Secondary | ICD-10-CM

## 2018-09-05 DIAGNOSIS — N401 Enlarged prostate with lower urinary tract symptoms: Secondary | ICD-10-CM | POA: Diagnosis not present

## 2018-09-05 MED ORDER — TRIAMCINOLONE ACETONIDE 0.5 % EX OINT: 1.0000 "application " | TOPICAL_OINTMENT | Freq: Two times a day (BID) | CUTANEOUS | 2 refills | Status: DC

## 2018-09-05 NOTE — Progress Notes (Signed)
Patient: Gregory Mckay, Male    DOB: 06-01-1946, 72 y.o.   MRN: [id number] Visit Date: 09/05/2018  Today's Provider: Lavon Paganini, MD   Chief Complaint  Patient presents with  . Annual Exam   Subjective:   Patient saw McKenzie for AVW on 08/29/2018.   Complete Physical Gregory Mckay is a 72 y.o. male. He feels well. He reports exercising none. He reports he is sleeping well.  Since last visit had negative cologuard  Saw Dr Yves Dill with Urology and had Urolift. Still getting testosterone pellets from [facility].  Requesting labs for this today.  Also wants to know the "thickness of his blood"?  He is now seeing Dr Nehemiah Massed (Cardiology) for episode of chest pain.  Had a normal stress test.  Now taking Crestor for HLD.    He is concerned about itching, dry rash on bilateral sides for several months.  Wants to know what he can put on this.  HTN: - Medications: amlodpine benazepril - Compliance: good - Checking BP at home: yes, typically in 096G systolic - Denies any SOB, CP, vision changes, LE edema, medication SEs, or symptoms of hypotension - Diet: low sodium  -----------------------------------------------------------   Review of Systems  Constitutional: Negative.   HENT: Negative.   Eyes: Negative.   Respiratory: Negative.   Cardiovascular: Negative.   Gastrointestinal: Negative.   Endocrine: Negative.   Genitourinary: Negative.   Musculoskeletal: Negative.   Skin: Positive for rash. Negative for color change, pallor and wound.  Allergic/Immunologic: Negative.   Neurological: Negative.   Hematological: Negative.   Psychiatric/Behavioral: Negative.     Social History   Socioeconomic History  . Marital status: Married    Spouse name: Tommie  . Number of children: 2  . Years of education: Not on file  . Highest education level: Associate degree: occupational, Hotel manager, or vocational program  Occupational History  . Occupation: President/CEO   Comment: Publishing rights manager Needs  . Financial resource strain: Not hard at all  . Food insecurity:    Worry: Never true    Inability: Never true  . Transportation needs:    Medical: No    Non-medical: No  Tobacco Use  . Smoking status: Never Smoker  . Smokeless tobacco: Never Used  Substance and Sexual Activity  . Alcohol use: No  . Drug use: No  . Sexual activity: Yes    Partners: Female  Lifestyle  . Physical activity:    Days per week: 2 days    Minutes per session: 60 min  . Stress: Not at all  Relationships  . Social connections:    Talks on phone: Patient refused    Gets together: Patient refused    Attends religious service: Patient refused    Active member of club or organization: Patient refused    Attends meetings of clubs or organizations: Patient refused    Relationship status: Patient refused  . Intimate partner violence:    Fear of current or ex partner: Patient refused    Emotionally abused: Patient refused    Physically abused: Patient refused    Forced sexual activity: Patient refused  Other Topics Concern  . Not on file  Social History Narrative  . Not on file    Past Medical History:  Diagnosis Date  . Cataract   . Hypertension   . Thyroid disease      Patient Active Problem List   Diagnosis Date Noted  . Hypertension 02/27/2018  .  Elevated PSA 02/27/2018  . Hypothyroidism 02/27/2018    Past Surgical History:  Procedure Laterality Date  . EYE SURGERY    . HERNIA REPAIR  1998   abdominal    His family history includes Healthy in his father and mother. There is no history of Colon cancer or Prostate cancer.      Current Outpatient Medications:  .  Acetylcarnitine HCl (ACETYL L-CARNITINE PO), Take 2 g by mouth 2 (two) times daily. Taking at 4:00 am and 4:00 pm, Disp: , Rfl:  .  Acetylcysteine (N-ACETYL-L-CYSTEINE) 600 MG CAPS, Take 1 capsule by mouth daily. Taking at 1:00 am, Disp: , Rfl:  .  Alpha-Lipoic Acid  600 MG CAPS, Take 1 capsule by mouth 2 (two) times daily. Taking at 4:00 am and 4:00 pm., Disp: , Rfl:  .  amLODipine-benazepril (LOTREL) 5-10 MG capsule, Take 1 capsule by mouth daily., Disp: 90 capsule, Rfl: 3 .  ARMOUR THYROID 60 MG tablet, TAKE ONE TABLET EVERY DAY BEFORE BREAKFAST, Disp: 90 tablet, Rfl: 1 .  Ashwagandha Extract 2.5 % POWD, Take 470 mg by mouth 2 (two) times daily. Taking at 2:00 am and 2:00 pm., Disp: , Rfl:  .  ASTAXANTHIN PO, Take 12 mg by mouth daily. Taking at 1:00 ma, Disp: , Rfl:  .  beta carotene 25000 UNIT capsule, Take 25,000 Units by mouth daily. Taking at 1:00 am, Disp: , Rfl:  .  Beta Glucan POWD, Take 200 mg by mouth 2 (two) times daily. Taking at 1:00 am and 1:00 pm, Disp: , Rfl:  .  Biotin 5000 MCG CAPS, Take 1 capsule by mouth daily. Taking at 1:00 am, Disp: , Rfl:  .  Carnosine POWD, Take 500 mg by mouth 2 (two) times daily. Taking at 2:00 am and 1:00 pm., Disp: , Rfl:  .  Cholecalciferol (VITAMIN D3) 10000 units TABS, Take 1 tablet by mouth daily. Taking at 1:00 am, Disp: , Rfl:  .  Cinnamon 500 MG capsule, Take 500 mg by mouth 2 (two) times daily. Taking at 1:00 am and 1:00 pm., Disp: , Rfl:  .  CITICOLINE SODIUM PO, Take 250 mg by mouth 2 (two) times daily. Taking at 1:00 am and 1:00 pm, Disp: , Rfl:  .  CITRULLINE PO, Take by mouth daily. Taking at 3:00 pm., Disp: , Rfl:  .  Coenzyme Q10 (COQ-10) 50 MG CAPS, Take 50 mg by mouth daily. Taking at 1:00 am., Disp: , Rfl:  .  DHEA 50 MG TABS, Take 1 tablet by mouth daily. Taking at 1:00 am., Disp: , Rfl:  .  Flaxseed, Linseed, (FLAX SEED OIL PO), Take 1,250 mg by mouth 2 (two) times daily. Flax Seed Plus Omega 3. Taking at 1:00 am and 1:00 pm., Disp: , Rfl:  .  GARLIC PO, Take 657 mg by mouth 2 (two) times daily. Aged Garlic Extract. Taking at 2:00 am and 2:00 pm, Disp: , Rfl:  .  Ginkgo Biloba 40 MG TABS, Take 120 mg by mouth 2 (two) times daily. Taking at 1:00 am and 1:00 pm., Disp: , Rfl:  .  Glucosamine  Sulfate 1000 MG TABS, Take 1 tablet by mouth 2 (two) times daily. 1 tablet at 1:00 am, and 1 tablet at 1:00 pm, Disp: , Rfl:  .  Glutamine 500 MG CAPS, Take 1 capsule by mouth 2 (two) times daily. Taking at 1:00 am and 1:00 pm, Disp: , Rfl:  .  Glycerylphosphorylcholine (ALPHA-GPC 50%) POWD, Take 300 mg by mouth 2 (  two) times daily. Taking at 1:00 am and 1:00 pm, Disp: , Rfl:  .  Green Tea, Camellia sinensis, (GREEN TEA EXTRACT PO), Take 750 mg by mouth daily. Taking at 1:00 am, Disp: , Rfl:  .  Gymnema Sylvestris Leaf POWD, Take 400 mg by mouth 2 (two) times daily. Taking at 1:00 am and 1:00 pm, Disp: , Rfl:  .  Levomefolate Glucosamine (METHYLFOLATE PO), Take 1,000 mcg by mouth daily. Taking at 1:00 am., Disp: , Rfl:  .  Lycopene 15 MG CAPS, Take 1 capsule by mouth daily. Taking at 1:00 am., Disp: , Rfl:  .  Menaquinone-7 (VITAMIN K2 PO), Take 2,700 mcg by mouth daily. Advanced K2 Complex. Taking at 1:00 am., Disp: , Rfl:  .  milk thistle 175 MG tablet, Take 175 mg by mouth 2 (two) times daily. Taking at 1:00 am and 1:00 pm, Disp: , Rfl:  .  Misc Natural Products (BETA-SITOSTEROL PLANT STEROLS) CAPS, Take 60 mg by mouth 2 (two) times daily. Taking at 3:00 and 3:00 pm, Disp: , Rfl:  .  Misc Natural Products (PROSTATE HEALTH PO), Take 1 tablet by mouth 2 (two) times daily. Taking at 1:00 am and 1:00 pm, Disp: , Rfl:  .  Nutritional Supplements (PYCNOGENOL PO), Take 100 mg by mouth 2 (two) times daily. Taking at 1:00 am and 1:00 pm., Disp: , Rfl:  .  OVER THE COUNTER MEDICATION, Take 750 mg by mouth 2 (two) times daily. Curamed. Taking 1 tablet at 1:00 am, and 1 tablet at 1:00 pm., Disp: , Rfl:  .  OVER THE COUNTER MEDICATION, Take 300 mg by mouth 2 (two) times daily. Ellagic Active. Taking at 1:00 am and 1:00 pm., Disp: , Rfl:  .  OVER THE COUNTER MEDICATION, 2 g 2 (two) times daily. propionyl-l-carnitine. Taking at 4:00 am and 4:00 pm., Disp: , Rfl:  .  OVER THE COUNTER MEDICATION, Take 30 mg by  mouth daily. Optizinc. Taking at 1:00 am, Disp: , Rfl:  .  OVER THE COUNTER MEDICATION, Take 250,000 Units by mouth daily. Taking at 6:00 am, Disp: , Rfl:  .  Pantothenic Acid 500 MG TABS, Take 1 tablet by mouth daily. Taking at 1:00 am, Disp: , Rfl:  .  PHOSPHATIDYLSERINE PO, Take 300 mg by mouth daily. Taking at 1:00 am, Disp: , Rfl:  .  pyridoxine (B-6) 100 MG tablet, Take 100 mg by mouth daily. Taking at 1:00 am., Disp: , Rfl:  .  QUERCETIN PO, Take by mouth 2 (two) times daily. Taking at 2:00 am and 2:00 pm., Disp: , Rfl:  .  RESVERATROL PO, Take 500 mg by mouth daily. Taking at 1:00 am., Disp: , Rfl:  .  rosuvastatin (CRESTOR) 5 MG tablet, Take 5 mg by mouth daily., Disp: , Rfl:  .  tadalafil (CIALIS) 5 MG tablet, Take 1 tablet (5 mg total) by mouth daily., Disp: 30 tablet, Rfl: 11 .  Tyrosine 500 MG TABS, Take 1 tablet by mouth daily. Taking at 2:00 am, Disp: , Rfl:  .  vitamin E 400 UNIT capsule, Take 400 Units by mouth daily. Taking at 1:00 am, Disp: , Rfl:  .  Vitamin Mixture (ESTER-C PO), Take 1,000 mg by mouth daily. Taking at 1:00 am, Disp: , Rfl:  .  triamcinolone ointment (KENALOG) 0.5 %, Apply 1 application topically 2 (two) times daily., Disp: 30 g, Rfl: 2  Patient Care Team: Virginia Crews, MD as PCP - General (Family Medicine) Corey Skains, MD as  Consulting Physician (Cardiology) Royston Cowper, MD as Consulting Physician (Urology)     Objective:   Vitals: BP (!) 156/80 (BP Location: Left Arm, Patient Position: Sitting, Cuff Size: Large)   Pulse 68   Temp 98 F (36.7 C) (Oral)   Resp 16   Ht 5\' 11"  (1.803 m)   Wt 200 lb (90.7 kg)   SpO2 97%   BMI 27.89 kg/m   Physical Exam  Constitutional: He is oriented to person, place, and time. He appears well-developed and well-nourished. No distress.  HENT:  Head: Normocephalic and atraumatic.  Right Ear: External ear normal.  Left Ear: External ear normal.  Nose: Nose normal.  Mouth/Throat: Oropharynx is  clear and moist.  Eyes: Pupils are equal, round, and reactive to light. Conjunctivae and EOM are normal. No scleral icterus.  Neck: Neck supple. No thyromegaly present.  Cardiovascular: Normal rate, regular rhythm, normal heart sounds and intact distal pulses.  No murmur heard. Pulmonary/Chest: Effort normal and breath sounds normal. No respiratory distress. He has no wheezes. He has no rales.  Abdominal: Soft. Bowel sounds are normal. He exhibits no distension. There is no tenderness. There is no rebound and no guarding.  Musculoskeletal: He exhibits no edema or deformity.  Lymphadenopathy:    He has no cervical adenopathy.  Neurological: He is alert and oriented to person, place, and time.  Skin: Skin is warm and dry. Capillary refill takes less than 2 seconds. Rash (dry eczematous patches over bilateral flanks) noted.  Psychiatric: He has a normal mood and affect. His behavior is normal.  Vitals reviewed.   Activities of Daily Living In your present state of health, do you have any difficulty performing the following activities: 08/29/2018 02/27/2018  Hearing? N N  Vision? N N  Difficulty concentrating or making decisions? N N  Walking or climbing stairs? N N  Dressing or bathing? N N  Doing errands, shopping? N N  Preparing Food and eating ? N -  Using the Toilet? N -  In the past six months, have you accidently leaked urine? N -  Do you have problems with loss of bowel control? N -  Managing your Medications? N -  Managing your Finances? N -  Housekeeping or managing your Housekeeping? N -  Some recent data might be hidden    Fall Risk Assessment Fall Risk  08/29/2018 02/27/2018  Falls in the past year? 0 No     Depression Screen PHQ 2/9 Scores 08/29/2018 02/27/2018  PHQ - 2 Score 0 0    No flowsheet data found.   Assessment & Plan:    Annual Physical Reviewed patient's Family Medical History Reviewed and updated list of patient's medical providers Assessment of  cognitive impairment was done Assessed patient's functional ability Established a written schedule for health screening [place] Completed and Reviewed  Exercise Activities and Dietary recommendations Goals    . DIET - INCREASE WATER INTAKE     Recommend to drink at least 6-8 8oz glasses of water per day.        Immunization History  Administered Date(s) Administered  . Pneumococcal Conjugate-13 12/20/2016  . Pneumococcal Polysaccharide-23 02/27/2018  . Zoster Recombinat (Shingrix) 02/27/2018, 04/30/2018    Health Maintenance  Topic Date Due  . Hepatitis C Screening  1945-10-20  . TETANUS/TDAP  01/26/1966  . INFLUENZA VACCINE  04/27/2019 (Originally 04/26/2018)  . Fecal DNA (Cologuard)  08/08/2021  . PNA vac Low Risk Adult  Completed  Discussed health benefits of physical activity, and encouraged him to engage in regular exercise appropriate for his age and condition.    -------------------------------------------------------------------------  Problem List Items Addressed This Visit      Cardiovascular and Mediastinum   Hypertension    Uncontrolled Today Discussed increasing 1 of his antihypertensives, but patient is very hesitant to do this He insists that whitecoat hypertension is playing a part in this He will have upcoming follow-up with cardiology as well He declines any change in medication today         Endocrine   Hypothyroidism    Last TSH wnl He has never taken Synthroid He is taking Armour thyroid Recheck TSH      Relevant Orders   TSH (Completed)     Musculoskeletal and Integument   Eczematous dermatitis    New problem Will treat with steroid cream BID        Other   Elevated PSA    Patient is being followed by urology We have plan to monitor PSA annually given that he has an elevated PSA baseline      Relevant Orders   PSA Total (Reflex To Free) (Completed)   Testosterone,Free and Total (Completed)    Hyperlipidemia    New problem Followed by cardiology Taking Crestor with good compliance Discussed it is too early to recheck lipid panel, but patient is very eager to know what his LDL is doing, so we will go ahead and check this even though it is unlikely we will adjust his Crestor dose as he has not been taking his medication yet for 3 months Also recheck LFTs      Relevant Orders   Lipid panel (Completed)   Hepatic function panel (Completed)    Other Visit Diagnoses    Encounter for annual physical exam    -  Primary   Overweight       Need for hepatitis C screening test       Relevant Orders   Hepatitis C Antibody (Completed)   Low testosterone       Relevant Orders   Testosterone,Free and Total (Completed)       Return in about 1 year (around 09/06/2019) for AWV/CPE.   The entirety of the information documented in the History of Present Illness, Review of Systems and Physical Exam were personally obtained by me. Portions of this information were initially documented by Tiburcio Pea, CMA and reviewed by me for thoroughness and accuracy.    Virginia Crews, MD, MPH [facility] 09/06/2018 5:19 PM

## 2018-09-05 NOTE — Assessment & Plan Note (Signed)
Last TSH wnl He has never taken Synthroid He is taking Armour thyroid Recheck TSH

## 2018-09-05 NOTE — Assessment & Plan Note (Addendum)
Uncontrolled Today Discussed increasing 1 of his antihypertensives, but patient is very hesitant to do this He insists that whitecoat hypertension is playing a part in this He will have upcoming follow-up with cardiology as well He declines any change in medication today

## 2018-09-05 NOTE — Patient Instructions (Signed)
Preventive Care 71 Years and Older, Male Preventive care refers to lifestyle choices and visits with your health care provider that can promote health and wellness. What does preventive care include?  A yearly physical exam. This is also called an annual well check.  Dental exams once or twice a year.  Routine eye exams. Ask your health care provider how often you should have your eyes checked.  Personal lifestyle choices, including: ? Daily care of your teeth and gums. ? Regular physical activity. ? Eating a healthy diet. ? Avoiding tobacco and drug use. ? Limiting alcohol use. ? Practicing safe sex. ? Taking low doses of aspirin every day. ? Taking vitamin and mineral supplements as recommended by your health care provider. What happens during an annual well check? The services and screenings done by your health care provider during your annual well check will depend on your age, overall health, lifestyle risk factors, and family history of disease. Counseling Your health care provider may ask you questions about your:  Alcohol use.  Tobacco use.  Drug use.  Emotional well-being.  Home and relationship well-being.  Sexual activity.  Eating habits.  History of falls.  Memory and ability to understand (cognition).  Work and work environment.  Screening You may have the following tests or measurements:  Height, weight, and BMI.  Blood pressure.  Lipid and cholesterol levels. These may be checked every 5 years, or more frequently if you are over 50 years old.  Skin check.  Lung cancer screening. You may have this screening every year starting at age 55 if you have a 30-pack-year history of smoking and currently smoke or have quit within the past 15 years.  Fecal occult blood test (FOBT) of the stool. You may have this test every year starting at age 50.  Flexible sigmoidoscopy or colonoscopy. You may have a sigmoidoscopy every 5 years or a colonoscopy every 10  years starting at age 50.  Prostate cancer screening. Recommendations will vary depending on your family history and other risks.  Hepatitis C blood test.  Hepatitis B blood test.  Sexually transmitted disease (STD) testing.  Diabetes screening. This is done by checking your blood sugar (glucose) after you have not eaten for a while (fasting). You may have this done every 1-3 years.  Abdominal aortic aneurysm (AAA) screening. You may need this if you are a current or former smoker.  Osteoporosis. You may be screened starting at age 70 if you are at high risk.  Talk with your health care provider about your test results, treatment options, and if necessary, the need for more tests. Vaccines Your health care provider may recommend certain vaccines, such as:  Influenza vaccine. This is recommended every year.  Tetanus, diphtheria, and acellular pertussis (Tdap, Td) vaccine. You may need a Td booster every 10 years.  Varicella vaccine. You may need this if you have not been vaccinated.  Zoster vaccine. You may need this after age 60.  Measles, mumps, and rubella (MMR) vaccine. You may need at least one dose of MMR if you were born in 1957 or later. You may also need a second dose.  Pneumococcal 13-valent conjugate (PCV13) vaccine. One dose is recommended after age 65.  Pneumococcal polysaccharide (PPSV23) vaccine. One dose is recommended after age 65.  Meningococcal vaccine. You may need this if you have certain conditions.  Hepatitis A vaccine. You may need this if you have certain conditions or if you travel or work in places where you   may be exposed to hepatitis A.  Hepatitis B vaccine. You may need this if you have certain conditions or if you travel or work in places where you may be exposed to hepatitis B.  Haemophilus influenzae type b (Hib) vaccine. You may need this if you have certain risk factors.  Talk to your health care provider about which screenings and vaccines  you need and how often you need them. This information is not intended to replace advice given to you by your health care provider. Make sure you discuss any questions you have with your health care provider. Document Released: 10/09/2015 Document Revised: 06/01/2016 Document Reviewed: 07/14/2015 Elsevier Interactive Patient Education  2018 Elsevier Inc.  

## 2018-09-05 NOTE — Assessment & Plan Note (Signed)
New problem Will treat with steroid cream BID

## 2018-09-06 ENCOUNTER — Telehealth: Payer: Self-pay

## 2018-09-06 DIAGNOSIS — E785 Hyperlipidemia, unspecified: Secondary | ICD-10-CM | POA: Insufficient documentation

## 2018-09-06 LAB — TSH: TSH: 2.3 u[IU]/mL (ref 0.450–4.500)

## 2018-09-06 LAB — FPSA% REFLEX
% FREE PSA: 16.2 %
PSA, FREE: 1.38 ng/mL

## 2018-09-06 LAB — LIPID PANEL
Chol/HDL Ratio: 4.7 ratio (ref 0.0–5.0)
Cholesterol, Total: 127 mg/dL (ref 100–199)
HDL: 27 mg/dL — AB (ref >39)
LDL Calculated: 74 mg/dL (ref 0–99)
TRIGLYCERIDES: 131 mg/dL (ref 0–149)
VLDL CHOLESTEROL CAL: 26 mg/dL (ref 5–40)

## 2018-09-06 LAB — PSA TOTAL (REFLEX TO FREE): PROSTATE SPECIFIC AG, SERUM: 8.5 ng/mL — AB (ref 0.0–4.0)

## 2018-09-06 LAB — HEPATIC FUNCTION PANEL
ALT: 25 IU/L (ref 0–44)
AST: 20 IU/L (ref 0–40)
Albumin: 4.4 g/dL (ref 3.5–4.8)
Alkaline Phosphatase: 48 IU/L (ref 39–117)
Bilirubin Total: 0.5 mg/dL (ref 0.0–1.2)
Bilirubin, Direct: 0.13 mg/dL (ref 0.00–0.40)
Total Protein: 6.9 g/dL (ref 6.0–8.5)

## 2018-09-06 LAB — HEPATITIS C ANTIBODY

## 2018-09-06 LAB — TESTOSTERONE,FREE AND TOTAL
TESTOSTERONE: 765 ng/dL (ref 264–916)
Testosterone, Free: 18.2 pg/mL — ABNORMAL HIGH (ref 6.6–18.1)

## 2018-09-06 NOTE — Telephone Encounter (Signed)
-----   Message from Virginia Crews, MD sent at 09/06/2018 12:09 PM EST ----- Cholesterol has decreased a lot. LDL almost to goal and hasn't even been on Crestor for 3 months.  PSA is elevated with moderate ratio with free PSA.  This is a long-standing problem. Can discuss with urology.  Testosterone level is normal. Normal LFTs, Thyroid function.  Neg Hep C screen

## 2018-09-06 NOTE — Telephone Encounter (Signed)
LMTCB

## 2018-09-06 NOTE — Assessment & Plan Note (Signed)
Patient is being followed by urology We have plan to monitor PSA annually given that he has an elevated PSA baseline

## 2018-09-06 NOTE — Assessment & Plan Note (Signed)
New problem Followed by cardiology Taking Crestor with good compliance Discussed it is too early to recheck lipid panel, but patient is very eager to know what his LDL is doing, so we will go ahead and check this even though it is unlikely we will adjust his Crestor dose as he has not been taking his medication yet for 3 months Also recheck LFTs

## 2018-09-07 NOTE — Telephone Encounter (Signed)
Pt returned missed call.  Please call pt back at [phone].  Thanks, [employer]

## 2018-09-07 NOTE — Telephone Encounter (Signed)
Patient advised.

## 2018-09-20 DIAGNOSIS — J180 Bronchopneumonia, unspecified organism: Secondary | ICD-10-CM | POA: Diagnosis not present

## 2018-09-20 DIAGNOSIS — R509 Fever, unspecified: Secondary | ICD-10-CM | POA: Diagnosis not present

## 2018-11-23 DIAGNOSIS — M5418 Radiculopathy, sacral and sacrococcygeal region: Secondary | ICD-10-CM | POA: Diagnosis not present

## 2018-11-23 DIAGNOSIS — M9904 Segmental and somatic dysfunction of sacral region: Secondary | ICD-10-CM | POA: Diagnosis not present

## 2018-11-23 DIAGNOSIS — M9903 Segmental and somatic dysfunction of lumbar region: Secondary | ICD-10-CM | POA: Diagnosis not present

## 2018-11-23 DIAGNOSIS — M5417 Radiculopathy, lumbosacral region: Secondary | ICD-10-CM | POA: Diagnosis not present

## 2018-11-26 ENCOUNTER — Other Ambulatory Visit: Payer: Self-pay | Admitting: Family Medicine

## 2018-11-26 DIAGNOSIS — M5417 Radiculopathy, lumbosacral region: Secondary | ICD-10-CM | POA: Diagnosis not present

## 2018-11-26 DIAGNOSIS — M5418 Radiculopathy, sacral and sacrococcygeal region: Secondary | ICD-10-CM | POA: Diagnosis not present

## 2018-11-26 DIAGNOSIS — M9903 Segmental and somatic dysfunction of lumbar region: Secondary | ICD-10-CM | POA: Diagnosis not present

## 2018-11-26 DIAGNOSIS — M9904 Segmental and somatic dysfunction of sacral region: Secondary | ICD-10-CM | POA: Diagnosis not present

## 2018-11-27 ENCOUNTER — Telehealth: Payer: Self-pay | Admitting: Family Medicine

## 2018-11-27 DIAGNOSIS — M5431 Sciatica, right side: Secondary | ICD-10-CM | POA: Diagnosis not present

## 2018-11-27 DIAGNOSIS — M62830 Muscle spasm of back: Secondary | ICD-10-CM | POA: Diagnosis not present

## 2018-11-27 DIAGNOSIS — M9905 Segmental and somatic dysfunction of pelvic region: Secondary | ICD-10-CM | POA: Diagnosis not present

## 2018-11-27 DIAGNOSIS — M9903 Segmental and somatic dysfunction of lumbar region: Secondary | ICD-10-CM | POA: Diagnosis not present

## 2018-11-27 NOTE — Telephone Encounter (Signed)
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 Wife states pt cannot come to office visit due to not being able to walk.  Please advise.  Thanks, [employer]

## 2018-11-28 DIAGNOSIS — M62830 Muscle spasm of back: Secondary | ICD-10-CM | POA: Diagnosis not present

## 2018-11-28 DIAGNOSIS — M5431 Sciatica, right side: Secondary | ICD-10-CM | POA: Diagnosis not present

## 2018-11-28 DIAGNOSIS — M9903 Segmental and somatic dysfunction of lumbar region: Secondary | ICD-10-CM | POA: Diagnosis not present

## 2018-11-28 DIAGNOSIS — M9905 Segmental and somatic dysfunction of pelvic region: Secondary | ICD-10-CM | POA: Diagnosis not present

## 2018-11-28 NOTE — Telephone Encounter (Signed)
Patient advised. He needed a appointment after chiropractor appointment. Scheduled with Adriana at 10:40.

## 2018-11-28 NOTE — Telephone Encounter (Signed)
Patient would need to be evaluated in order for [place] to treat him.  Can take OTC antiinflammatories or Tylenol

## 2018-11-28 NOTE — Telephone Encounter (Signed)
Pt is calling for an update on this.  He states he uses [facility].   Thanks,   -Mickel Baas

## 2018-11-29 ENCOUNTER — Ambulatory Visit: Payer: Medicare Other | Admitting: Physician Assistant

## 2018-11-29 ENCOUNTER — Encounter: Payer: Self-pay | Admitting: Physician Assistant

## 2018-11-29 VITALS — BP 142/90 | HR 76 | Temp 98.6°F | Resp 16 | Wt 197.0 lb

## 2018-11-29 DIAGNOSIS — M5416 Radiculopathy, lumbar region: Secondary | ICD-10-CM | POA: Diagnosis not present

## 2018-11-29 DIAGNOSIS — M9903 Segmental and somatic dysfunction of lumbar region: Secondary | ICD-10-CM | POA: Diagnosis not present

## 2018-11-29 DIAGNOSIS — M62830 Muscle spasm of back: Secondary | ICD-10-CM | POA: Diagnosis not present

## 2018-11-29 DIAGNOSIS — M5431 Sciatica, right side: Secondary | ICD-10-CM | POA: Diagnosis not present

## 2018-11-29 DIAGNOSIS — M9905 Segmental and somatic dysfunction of pelvic region: Secondary | ICD-10-CM | POA: Diagnosis not present

## 2018-11-29 MED ORDER — CYCLOBENZAPRINE HCL 5 MG PO TABS: 5.0000 mg | ORAL_TABLET | Freq: Every day | ORAL | 0 refills | Status: DC

## 2018-11-29 MED ORDER — GABAPENTIN 300 MG PO CAPS: 300.0000 mg | ORAL_CAPSULE | Freq: Three times a day (TID) | ORAL | 0 refills | Status: DC

## 2018-11-29 MED ORDER — PREDNISONE 10 MG (48) PO TBPK: ORAL_TABLET | ORAL | 0 refills | Status: DC

## 2018-11-29 NOTE — Patient Instructions (Signed)
Radicular Pain Radicular pain is a type of pain that spreads from your back or neck along a spinal nerve. Spinal nerves are nerves that leave the spinal cord and go to the muscles. Radicular pain is sometimes called radiculopathy, radiculitis, or a pinched nerve. When you have this type of pain, you may also have weakness, numbness, or tingling in the area of your body that is supplied by the nerve. The pain may feel sharp and burning. Depending on which spinal nerve is affected, the pain may occur in the:  Neck area (cervical radicular pain). You may also feel pain, numbness, weakness, or tingling in the arms.  Mid-spine area (thoracic radicular pain). You would feel this pain in the back and chest. This type is rare.  Lower back area (lumbar radicular pain). You would feel this pain as low back pain. You may feel pain, numbness, weakness, or tingling in the buttocks or legs. Sciatica is a type of lumbar radicular pain that shoots down the back of the leg. Radicular pain occurs when one of the spinal nerves becomes irritated or squeezed (compressed). It is often caused by something pushing on a spinal nerve, such as one of the bones of the spine (vertebrae) or one of the round cushions between vertebrae (intervertebral disks). This can result from:  An injury.  Wear and tear or aging of a disk.  The growth of a bone spur that pushes on the nerve. Radicular pain often goes away when you follow instructions from your health care provider for relieving pain at home. Follow these instructions at home: Managing pain      If directed, put ice on the affected area: ? Put ice in a plastic bag. ? Place a towel between your skin and the bag. ? Leave the ice on for 20 minutes, 2-3 times a day.  If directed, apply heat to the affected area as often as told by your health care provider. Use the heat source that your health care provider recommends, such as a moist heat pack or a heating pad. ? Place  a towel between your skin and the heat source. ? Leave the heat on for 20-30 minutes. ? Remove the heat if your skin turns bright red. This is especially important if you are unable to feel pain, heat, or cold. You may have a greater risk of getting burned. Activity   Do not sit or rest in bed for long periods of time.  Try to stay as active as possible. Ask your health care provider what type of exercise or activity is best for you.  Avoid activities that make your pain worse, such as bending and lifting.  Do not lift anything that is heavier than 10 lb (4.5 kg), or the limit that you are told, until your health care provider says that it is safe.  Practice using proper technique when lifting items. Proper lifting technique involves bending your knees and rising up.  Do strength and range-of-motion exercises only as told by your health care provider or physical therapist. General instructions  Take over-the-counter and prescription medicines only as told by your health care provider.  Pay attention to any changes in your symptoms.  Keep all follow-up visits as told by your health care provider. This is important. ? Your health care provider may send you to a physical therapist to help with this pain. Contact a health care provider if:  Your pain and other symptoms get worse.  Your pain medicine is not   helping.  Your pain has not improved after a few weeks of home care.  You have a fever. Get help right away if:  You have severe pain, weakness, or numbness.  You have difficulty with bladder or bowel control. Summary  Radicular pain is a type of pain that spreads from your back or neck along a spinal nerve.  When you have radicular pain, you may also have weakness, numbness, or tingling in the area of your body that is supplied by the nerve.  The pain may feel sharp or burning.  Radicular pain may be treated with ice, heat, medicines, or physical therapy. This  information is not intended to replace advice given to you by your health care provider. Make sure you discuss any questions you have with your health care provider. Document Released: 10/20/2004 Document Revised: 03/27/2018 Document Reviewed: 03/27/2018 Elsevier Interactive Patient Education  2019 [employer]  

## 2018-11-29 NOTE — Progress Notes (Signed)
Patient: Gregory Mckay Male    DOB: 09/16/1946   74 y.o.   MRN: [id number] Visit Date: 11/29/2018  Today's Provider: Trinna Post, PA-C   Chief Complaint  Patient presents with  . Back Pain   Subjective:     Back Pain  This is a new problem. The current episode started 1 to 4 weeks ago. The problem occurs intermittently. The problem has been gradually worsening since onset. The pain is present in the lumbar spine. The quality of the pain is described as burning and shooting. The pain radiates to the right thigh. The pain is at a severity of 10/10. The pain is moderate. The pain is the same all the time. The symptoms are aggravated by bending, lying down, position and standing (any weight bearing). Associated symptoms include leg pain, tingling and weakness. Pertinent negatives include no abdominal pain, bladder incontinence, bowel incontinence, chest pain, dysuria, fever, headaches, numbness, paresis, paresthesias, pelvic pain or perianal numbness. Treatments tried: Tylenol.   Reports chiropractor has adjusted him and is unable to do so appropriately because he was told his muscles are too tight to do so. He has no known back injury. Received spine xrays at chiropractor but has not other back imaging to date.  Pain travels from low back along lateral aspect of right leg and into right foot.   Of note, patient has had PSA of 8.5 which he reports has been addressed by urologist Dr. Rogers Blocker. He has imaged this patient's prostate with MRI and determined this is non cancerous cause. Patient has had procedure on prostate.    No Known Allergies   Current Outpatient Medications:  .  Acetylcarnitine HCl (ACETYL L-CARNITINE PO), Take 2 g by mouth 2 (two) times daily. Taking at 4:00 am and 4:00 pm, Disp: , Rfl:  .  Acetylcysteine (N-ACETYL-L-CYSTEINE) 600 MG CAPS, Take 1 capsule by mouth daily. Taking at 1:00 am, Disp: , Rfl:  .  Alpha-Lipoic Acid 600 MG CAPS, Take 1 capsule by mouth 2 (two)  times daily. Taking at 4:00 am and 4:00 pm., Disp: , Rfl:  .  amLODipine-benazepril (LOTREL) 5-10 MG capsule, TAKE 1 CAPSULE EVERY DAY, Disp: 90 capsule, Rfl: 3 .  ARMOUR THYROID 60 MG tablet, TAKE ONE TABLET EVERY DAY BEFORE BREAKFAST, Disp: 90 tablet, Rfl: 1 .  Ashwagandha Extract 2.5 % POWD, Take 470 mg by mouth 2 (two) times daily. Taking at 2:00 am and 2:00 pm., Disp: , Rfl:  .  ASTAXANTHIN PO, Take 12 mg by mouth daily. Taking at 1:00 ma, Disp: , Rfl:  .  beta carotene 25000 UNIT capsule, Take 25,000 Units by mouth daily. Taking at 1:00 am, Disp: , Rfl:  .  Beta Glucan POWD, Take 200 mg by mouth 2 (two) times daily. Taking at 1:00 am and 1:00 pm, Disp: , Rfl:  .  Biotin 5000 MCG CAPS, Take 1 capsule by mouth daily. Taking at 1:00 am, Disp: , Rfl:  .  Carnosine POWD, Take 500 mg by mouth 2 (two) times daily. Taking at 2:00 am and 1:00 pm., Disp: , Rfl:  .  Cholecalciferol (VITAMIN D3) 10000 units TABS, Take 1 tablet by mouth daily. Taking at 1:00 am, Disp: , Rfl:  .  Cinnamon 500 MG capsule, Take 500 mg by mouth 2 (two) times daily. Taking at 1:00 am and 1:00 pm., Disp: , Rfl:  .  CITICOLINE SODIUM PO, Take 250 mg by mouth 2 (two) times daily. Taking at  1:00 am and 1:00 pm, Disp: , Rfl:  .  CITRULLINE PO, Take by mouth daily. Taking at 3:00 pm., Disp: , Rfl:  .  Coenzyme Q10 (COQ-10) 50 MG CAPS, Take 50 mg by mouth daily. Taking at 1:00 am., Disp: , Rfl:  .  DHEA 50 MG TABS, Take 1 tablet by mouth daily. Taking at 1:00 am., Disp: , Rfl:  .  Flaxseed, Linseed, (FLAX SEED OIL PO), Take 1,250 mg by mouth 2 (two) times daily. Flax Seed Plus Omega 3. Taking at 1:00 am and 1:00 pm., Disp: , Rfl:  .  GARLIC PO, Take 696 mg by mouth 2 (two) times daily. Aged Garlic Extract. Taking at 2:00 am and 2:00 pm, Disp: , Rfl:  .  Ginkgo Biloba 40 MG TABS, Take 120 mg by mouth 2 (two) times daily. Taking at 1:00 am and 1:00 pm., Disp: , Rfl:  .  Glucosamine Sulfate 1000 MG TABS, Take 1 tablet by mouth 2 (two)  times daily. 1 tablet at 1:00 am, and 1 tablet at 1:00 pm, Disp: , Rfl:  .  Glutamine 500 MG CAPS, Take 1 capsule by mouth 2 (two) times daily. Taking at 1:00 am and 1:00 pm, Disp: , Rfl:  .  Glycerylphosphorylcholine (ALPHA-GPC 50%) POWD, Take 300 mg by mouth 2 (two) times daily. Taking at 1:00 am and 1:00 pm, Disp: , Rfl:  .  Green Tea, Camellia sinensis, (GREEN TEA EXTRACT PO), Take 750 mg by mouth daily. Taking at 1:00 am, Disp: , Rfl:  .  Gymnema Sylvestris Leaf POWD, Take 400 mg by mouth 2 (two) times daily. Taking at 1:00 am and 1:00 pm, Disp: , Rfl:  .  Levomefolate Glucosamine (METHYLFOLATE PO), Take 1,000 mcg by mouth daily. Taking at 1:00 am., Disp: , Rfl:  .  Lycopene 15 MG CAPS, Take 1 capsule by mouth daily. Taking at 1:00 am., Disp: , Rfl:  .  Menaquinone-7 (VITAMIN K2 PO), Take 2,700 mcg by mouth daily. Advanced K2 Complex. Taking at 1:00 am., Disp: , Rfl:  .  milk thistle 175 MG tablet, Take 175 mg by mouth 2 (two) times daily. Taking at 1:00 am and 1:00 pm, Disp: , Rfl:  .  Misc Natural Products (BETA-SITOSTEROL PLANT STEROLS) CAPS, Take 60 mg by mouth 2 (two) times daily. Taking at 3:00 and 3:00 pm, Disp: , Rfl:  .  Misc Natural Products (PROSTATE HEALTH PO), Take 1 tablet by mouth 2 (two) times daily. Taking at 1:00 am and 1:00 pm, Disp: , Rfl:  .  Nutritional Supplements (PYCNOGENOL PO), Take 100 mg by mouth 2 (two) times daily. Taking at 1:00 am and 1:00 pm., Disp: , Rfl:  .  OVER THE COUNTER MEDICATION, Take 750 mg by mouth 2 (two) times daily. Curamed. Taking 1 tablet at 1:00 am, and 1 tablet at 1:00 pm., Disp: , Rfl:  .  OVER THE COUNTER MEDICATION, Take 300 mg by mouth 2 (two) times daily. Ellagic Active. Taking at 1:00 am and 1:00 pm., Disp: , Rfl:  .  OVER THE COUNTER MEDICATION, 2 g 2 (two) times daily. propionyl-l-carnitine. Taking at 4:00 am and 4:00 pm., Disp: , Rfl:  .  OVER THE COUNTER MEDICATION, Take 30 mg by mouth daily. Optizinc. Taking at 1:00 am, Disp: , Rfl:    .  OVER THE COUNTER MEDICATION, Take 250,000 Units by mouth daily. Taking at 6:00 am, Disp: , Rfl:  .  Pantothenic Acid 500 MG TABS, Take 1 tablet by mouth daily.  Taking at 1:00 am, Disp: , Rfl:  .  PHOSPHATIDYLSERINE PO, Take 300 mg by mouth daily. Taking at 1:00 am, Disp: , Rfl:  .  pyridoxine (B-6) 100 MG tablet, Take 100 mg by mouth daily. Taking at 1:00 am., Disp: , Rfl:  .  QUERCETIN PO, Take by mouth 2 (two) times daily. Taking at 2:00 am and 2:00 pm., Disp: , Rfl:  .  RESVERATROL PO, Take 500 mg by mouth daily. Taking at 1:00 am., Disp: , Rfl:  .  rosuvastatin (CRESTOR) 5 MG tablet, Take 5 mg by mouth daily., Disp: , Rfl:  .  tadalafil (CIALIS) 5 MG tablet, TAKE ONE TABLET EVERY DAY, Disp: 90 tablet, Rfl: 3 .  triamcinolone ointment (KENALOG) 0.5 %, Apply 1 application topically 2 (two) times daily., Disp: 30 g, Rfl: 2 .  Tyrosine 500 MG TABS, Take 1 tablet by mouth daily. Taking at 2:00 am, Disp: , Rfl:  .  vitamin E 400 UNIT capsule, Take 400 Units by mouth daily. Taking at 1:00 am, Disp: , Rfl:  .  Vitamin Mixture (ESTER-C PO), Take 1,000 mg by mouth daily. Taking at 1:00 am, Disp: , Rfl:   Review of Systems  Constitutional: Negative for fever.  Cardiovascular: Negative for chest pain.  Gastrointestinal: Negative for abdominal pain and bowel incontinence.  Genitourinary: Negative for bladder incontinence, dysuria and pelvic pain.  Musculoskeletal: Positive for back pain.  Neurological: Positive for tingling and weakness. Negative for numbness, headaches and paresthesias.    Social History   Tobacco Use  . Smoking status: Never Smoker  . Smokeless tobacco: Never Used  Substance Use Topics  . Alcohol use: No      Objective:   BP (!) 142/90   Pulse 76   Temp 98.6 F (37 C) (Oral)   Resp 16   Wt 197 lb (89.4 kg)   BMI 27.48 kg/m  Vitals:   11/29/18 1053  BP: (!) 142/90  Pulse: 76  Resp: 16  Temp: 98.6 F (37 C)  TempSrc: Oral  Weight: 197 lb (89.4 kg)      Physical Exam Constitutional:      Appearance: Normal appearance. He is normal weight.     Comments: Does appear to have flares of pain during office visit.   Musculoskeletal:     Comments: 5/5 strength in bilateral lower extremity.   Skin:    General: Skin is warm and dry.  Neurological:     Mental Status: He is oriented to person, place, and time. Mental status is at baseline.  Psychiatric:        Mood and Affect: Mood normal.        Behavior: Behavior normal.         Assessment & Plan    1. Lumbar radiculopathy  Symptoms consistent with L4-L5 lumbar radiculopathy. Have counseled on causes, expected duration (flare vs. Chronic) and different approaches to management. Will treat as below. Should be cautious taking muscle relaxer with gabapentin due to sedation. Recommend starting gabapentin first and if he wants to start muscle relaxer can replace night time dose of gabapentin with flexeril. Instructed on titration of gabapentin. Advised that if he has significant ongoing pain for > 6 weeks he may need further imaging such as MRI and to return to clinic for this.   - predniSONE (STERAPRED UNI-PAK 48 TAB) 10 MG (48) TBPK tablet; Take 6 pills on day 1, 5 pills on day 2, 4 pills on day 3 and so on until  complete.  Dispense: 21 tablet; Refill: 0 - gabapentin (NEURONTIN) 300 MG capsule; Take 1 capsule (300 mg total) by mouth 3 (three) times daily.  Dispense: 90 capsule; Refill: 0 - cyclobenzaprine (FLEXERIL) 5 MG tablet; Take 1 tablet (5 mg total) by mouth at bedtime.  Dispense: 30 tablet; Refill: 0  I have spent 25 minutes with this patient, >50% of which was spent on counseling and coordination of care.     Trinna Post, PA-C  [facility]

## 2018-11-30 DIAGNOSIS — M9903 Segmental and somatic dysfunction of lumbar region: Secondary | ICD-10-CM | POA: Diagnosis not present

## 2018-11-30 DIAGNOSIS — M5431 Sciatica, right side: Secondary | ICD-10-CM | POA: Diagnosis not present

## 2018-11-30 DIAGNOSIS — M62830 Muscle spasm of back: Secondary | ICD-10-CM | POA: Diagnosis not present

## 2018-11-30 DIAGNOSIS — M9905 Segmental and somatic dysfunction of pelvic region: Secondary | ICD-10-CM | POA: Diagnosis not present

## 2018-12-01 DIAGNOSIS — M62830 Muscle spasm of back: Secondary | ICD-10-CM | POA: Diagnosis not present

## 2018-12-01 DIAGNOSIS — M9903 Segmental and somatic dysfunction of lumbar region: Secondary | ICD-10-CM | POA: Diagnosis not present

## 2018-12-01 DIAGNOSIS — M9905 Segmental and somatic dysfunction of pelvic region: Secondary | ICD-10-CM | POA: Diagnosis not present

## 2018-12-01 DIAGNOSIS — M5431 Sciatica, right side: Secondary | ICD-10-CM | POA: Diagnosis not present

## 2018-12-03 DIAGNOSIS — M9905 Segmental and somatic dysfunction of pelvic region: Secondary | ICD-10-CM | POA: Diagnosis not present

## 2018-12-03 DIAGNOSIS — M5431 Sciatica, right side: Secondary | ICD-10-CM | POA: Diagnosis not present

## 2018-12-03 DIAGNOSIS — M9903 Segmental and somatic dysfunction of lumbar region: Secondary | ICD-10-CM | POA: Diagnosis not present

## 2018-12-03 DIAGNOSIS — M62830 Muscle spasm of back: Secondary | ICD-10-CM | POA: Diagnosis not present

## 2018-12-04 DIAGNOSIS — M62830 Muscle spasm of back: Secondary | ICD-10-CM | POA: Diagnosis not present

## 2018-12-04 DIAGNOSIS — M9905 Segmental and somatic dysfunction of pelvic region: Secondary | ICD-10-CM | POA: Diagnosis not present

## 2018-12-04 DIAGNOSIS — M9903 Segmental and somatic dysfunction of lumbar region: Secondary | ICD-10-CM | POA: Diagnosis not present

## 2018-12-04 DIAGNOSIS — M5431 Sciatica, right side: Secondary | ICD-10-CM | POA: Diagnosis not present

## 2018-12-05 ENCOUNTER — Telehealth: Payer: Self-pay | Admitting: Family Medicine

## 2018-12-05 DIAGNOSIS — M62830 Muscle spasm of back: Secondary | ICD-10-CM | POA: Diagnosis not present

## 2018-12-05 DIAGNOSIS — M5431 Sciatica, right side: Secondary | ICD-10-CM | POA: Diagnosis not present

## 2018-12-05 DIAGNOSIS — M5416 Radiculopathy, lumbar region: Secondary | ICD-10-CM

## 2018-12-05 DIAGNOSIS — M9905 Segmental and somatic dysfunction of pelvic region: Secondary | ICD-10-CM | POA: Diagnosis not present

## 2018-12-05 DIAGNOSIS — M4716 Other spondylosis with myelopathy, lumbar region: Secondary | ICD-10-CM | POA: Diagnosis not present

## 2018-12-05 DIAGNOSIS — M9903 Segmental and somatic dysfunction of lumbar region: Secondary | ICD-10-CM | POA: Diagnosis not present

## 2018-12-05 NOTE — Telephone Encounter (Signed)
I will place referral to Dr. Sharlet Salina who is a physiatrist at [facility]. He will not get an appointment today, he will likely get in to the office in several weeks.

## 2018-12-05 NOTE — Telephone Encounter (Signed)
Pt called saying he is not getting any relief on his back and is still in  A lot of pain.  He seen the chiropractor today and he suggested that Mr. Hoeffner see Dr. Sharlet Salina at [facility] pain management.  He suggested that he goes today for a shot in his back.    Pt's CB#  [phone]   Thanks Con Memos

## 2018-12-05 NOTE — Telephone Encounter (Signed)
Please Review

## 2018-12-05 NOTE — Telephone Encounter (Signed)
Patient advised as directed below. 

## 2018-12-06 DIAGNOSIS — M5431 Sciatica, right side: Secondary | ICD-10-CM | POA: Diagnosis not present

## 2018-12-06 DIAGNOSIS — M62830 Muscle spasm of back: Secondary | ICD-10-CM | POA: Diagnosis not present

## 2018-12-06 DIAGNOSIS — M9905 Segmental and somatic dysfunction of pelvic region: Secondary | ICD-10-CM | POA: Diagnosis not present

## 2018-12-06 DIAGNOSIS — M9903 Segmental and somatic dysfunction of lumbar region: Secondary | ICD-10-CM | POA: Diagnosis not present

## 2018-12-06 DIAGNOSIS — M4716 Other spondylosis with myelopathy, lumbar region: Secondary | ICD-10-CM | POA: Diagnosis not present

## 2018-12-07 DIAGNOSIS — G894 Chronic pain syndrome: Secondary | ICD-10-CM | POA: Diagnosis not present

## 2018-12-07 DIAGNOSIS — M5137 Other intervertebral disc degeneration, lumbosacral region: Secondary | ICD-10-CM | POA: Diagnosis not present

## 2018-12-07 DIAGNOSIS — M5416 Radiculopathy, lumbar region: Secondary | ICD-10-CM | POA: Diagnosis not present

## 2018-12-11 DIAGNOSIS — M5416 Radiculopathy, lumbar region: Secondary | ICD-10-CM | POA: Diagnosis not present

## 2018-12-17 DIAGNOSIS — M9903 Segmental and somatic dysfunction of lumbar region: Secondary | ICD-10-CM | POA: Diagnosis not present

## 2018-12-17 DIAGNOSIS — M9905 Segmental and somatic dysfunction of pelvic region: Secondary | ICD-10-CM | POA: Diagnosis not present

## 2018-12-17 DIAGNOSIS — M5431 Sciatica, right side: Secondary | ICD-10-CM | POA: Diagnosis not present

## 2018-12-17 DIAGNOSIS — M62830 Muscle spasm of back: Secondary | ICD-10-CM | POA: Diagnosis not present

## 2018-12-19 DIAGNOSIS — M5431 Sciatica, right side: Secondary | ICD-10-CM | POA: Diagnosis not present

## 2018-12-19 DIAGNOSIS — M9903 Segmental and somatic dysfunction of lumbar region: Secondary | ICD-10-CM | POA: Diagnosis not present

## 2018-12-19 DIAGNOSIS — M9905 Segmental and somatic dysfunction of pelvic region: Secondary | ICD-10-CM | POA: Diagnosis not present

## 2018-12-19 DIAGNOSIS — M62830 Muscle spasm of back: Secondary | ICD-10-CM | POA: Diagnosis not present

## 2018-12-21 DIAGNOSIS — M62830 Muscle spasm of back: Secondary | ICD-10-CM | POA: Diagnosis not present

## 2018-12-21 DIAGNOSIS — M9903 Segmental and somatic dysfunction of lumbar region: Secondary | ICD-10-CM | POA: Diagnosis not present

## 2018-12-21 DIAGNOSIS — M5431 Sciatica, right side: Secondary | ICD-10-CM | POA: Diagnosis not present

## 2018-12-21 DIAGNOSIS — M9905 Segmental and somatic dysfunction of pelvic region: Secondary | ICD-10-CM | POA: Diagnosis not present

## 2018-12-24 DIAGNOSIS — M9905 Segmental and somatic dysfunction of pelvic region: Secondary | ICD-10-CM | POA: Diagnosis not present

## 2018-12-24 DIAGNOSIS — M5431 Sciatica, right side: Secondary | ICD-10-CM | POA: Diagnosis not present

## 2018-12-24 DIAGNOSIS — M9903 Segmental and somatic dysfunction of lumbar region: Secondary | ICD-10-CM | POA: Diagnosis not present

## 2018-12-24 DIAGNOSIS — M62830 Muscle spasm of back: Secondary | ICD-10-CM | POA: Diagnosis not present

## 2018-12-26 DIAGNOSIS — M9903 Segmental and somatic dysfunction of lumbar region: Secondary | ICD-10-CM | POA: Diagnosis not present

## 2018-12-26 DIAGNOSIS — M9905 Segmental and somatic dysfunction of pelvic region: Secondary | ICD-10-CM | POA: Diagnosis not present

## 2018-12-26 DIAGNOSIS — M5431 Sciatica, right side: Secondary | ICD-10-CM | POA: Diagnosis not present

## 2018-12-26 DIAGNOSIS — M62830 Muscle spasm of back: Secondary | ICD-10-CM | POA: Diagnosis not present

## 2018-12-28 DIAGNOSIS — M5431 Sciatica, right side: Secondary | ICD-10-CM | POA: Diagnosis not present

## 2018-12-28 DIAGNOSIS — M9905 Segmental and somatic dysfunction of pelvic region: Secondary | ICD-10-CM | POA: Diagnosis not present

## 2018-12-28 DIAGNOSIS — M62830 Muscle spasm of back: Secondary | ICD-10-CM | POA: Diagnosis not present

## 2018-12-28 DIAGNOSIS — M9903 Segmental and somatic dysfunction of lumbar region: Secondary | ICD-10-CM | POA: Diagnosis not present

## 2018-12-31 DIAGNOSIS — M9905 Segmental and somatic dysfunction of pelvic region: Secondary | ICD-10-CM | POA: Diagnosis not present

## 2018-12-31 DIAGNOSIS — M5431 Sciatica, right side: Secondary | ICD-10-CM | POA: Diagnosis not present

## 2018-12-31 DIAGNOSIS — M62830 Muscle spasm of back: Secondary | ICD-10-CM | POA: Diagnosis not present

## 2018-12-31 DIAGNOSIS — M9903 Segmental and somatic dysfunction of lumbar region: Secondary | ICD-10-CM | POA: Diagnosis not present

## 2019-01-07 DIAGNOSIS — M5431 Sciatica, right side: Secondary | ICD-10-CM | POA: Diagnosis not present

## 2019-01-07 DIAGNOSIS — M9905 Segmental and somatic dysfunction of pelvic region: Secondary | ICD-10-CM | POA: Diagnosis not present

## 2019-01-07 DIAGNOSIS — M9903 Segmental and somatic dysfunction of lumbar region: Secondary | ICD-10-CM | POA: Diagnosis not present

## 2019-01-07 DIAGNOSIS — M62830 Muscle spasm of back: Secondary | ICD-10-CM | POA: Diagnosis not present

## 2019-01-10 DIAGNOSIS — M9905 Segmental and somatic dysfunction of pelvic region: Secondary | ICD-10-CM | POA: Diagnosis not present

## 2019-01-10 DIAGNOSIS — M62830 Muscle spasm of back: Secondary | ICD-10-CM | POA: Diagnosis not present

## 2019-01-10 DIAGNOSIS — M5431 Sciatica, right side: Secondary | ICD-10-CM | POA: Diagnosis not present

## 2019-01-10 DIAGNOSIS — M9903 Segmental and somatic dysfunction of lumbar region: Secondary | ICD-10-CM | POA: Diagnosis not present

## 2019-01-14 DIAGNOSIS — M9903 Segmental and somatic dysfunction of lumbar region: Secondary | ICD-10-CM | POA: Diagnosis not present

## 2019-01-14 DIAGNOSIS — M9905 Segmental and somatic dysfunction of pelvic region: Secondary | ICD-10-CM | POA: Diagnosis not present

## 2019-01-14 DIAGNOSIS — M5431 Sciatica, right side: Secondary | ICD-10-CM | POA: Diagnosis not present

## 2019-01-14 DIAGNOSIS — M62830 Muscle spasm of back: Secondary | ICD-10-CM | POA: Diagnosis not present

## 2019-01-21 DIAGNOSIS — M62830 Muscle spasm of back: Secondary | ICD-10-CM | POA: Diagnosis not present

## 2019-01-21 DIAGNOSIS — M9905 Segmental and somatic dysfunction of pelvic region: Secondary | ICD-10-CM | POA: Diagnosis not present

## 2019-01-21 DIAGNOSIS — M5431 Sciatica, right side: Secondary | ICD-10-CM | POA: Diagnosis not present

## 2019-01-21 DIAGNOSIS — M9903 Segmental and somatic dysfunction of lumbar region: Secondary | ICD-10-CM | POA: Diagnosis not present

## 2019-01-23 ENCOUNTER — Other Ambulatory Visit: Payer: Self-pay | Admitting: Family Medicine

## 2019-01-23 DIAGNOSIS — E039 Hypothyroidism, unspecified: Secondary | ICD-10-CM

## 2019-01-28 DIAGNOSIS — M9905 Segmental and somatic dysfunction of pelvic region: Secondary | ICD-10-CM | POA: Diagnosis not present

## 2019-01-28 DIAGNOSIS — M5431 Sciatica, right side: Secondary | ICD-10-CM | POA: Diagnosis not present

## 2019-01-28 DIAGNOSIS — M9903 Segmental and somatic dysfunction of lumbar region: Secondary | ICD-10-CM | POA: Diagnosis not present

## 2019-01-28 DIAGNOSIS — M62830 Muscle spasm of back: Secondary | ICD-10-CM | POA: Diagnosis not present

## 2019-02-12 DIAGNOSIS — M9903 Segmental and somatic dysfunction of lumbar region: Secondary | ICD-10-CM | POA: Diagnosis not present

## 2019-02-12 DIAGNOSIS — M9905 Segmental and somatic dysfunction of pelvic region: Secondary | ICD-10-CM | POA: Diagnosis not present

## 2019-02-12 DIAGNOSIS — M62830 Muscle spasm of back: Secondary | ICD-10-CM | POA: Diagnosis not present

## 2019-02-12 DIAGNOSIS — M5431 Sciatica, right side: Secondary | ICD-10-CM | POA: Diagnosis not present

## 2019-04-25 ENCOUNTER — Other Ambulatory Visit: Payer: Self-pay | Admitting: Family Medicine

## 2019-04-25 DIAGNOSIS — E039 Hypothyroidism, unspecified: Secondary | ICD-10-CM

## 2019-06-04 ENCOUNTER — Ambulatory Visit (INDEPENDENT_AMBULATORY_CARE_PROVIDER_SITE_OTHER): Payer: Medicare Other

## 2019-06-04 ENCOUNTER — Other Ambulatory Visit: Payer: Self-pay

## 2019-06-04 DIAGNOSIS — Z23 Encounter for immunization: Secondary | ICD-10-CM

## 2019-06-04 NOTE — Progress Notes (Signed)
Flu

## 2019-08-08 DIAGNOSIS — L720 Epidermal cyst: Secondary | ICD-10-CM | POA: Diagnosis not present

## 2019-08-08 DIAGNOSIS — D1800 Hemangioma unspecified site: Secondary | ICD-10-CM | POA: Diagnosis not present

## 2019-08-08 DIAGNOSIS — L820 Inflamed seborrheic keratosis: Secondary | ICD-10-CM | POA: Diagnosis not present

## 2019-08-08 DIAGNOSIS — L578 Other skin changes due to chronic exposure to nonionizing radiation: Secondary | ICD-10-CM | POA: Diagnosis not present

## 2019-09-03 ENCOUNTER — Ambulatory Visit: Payer: Medicare Other

## 2019-09-05 NOTE — Progress Notes (Addendum)
Subjective:   Gregory Mckay is a 73 y.o. male who presents for Medicare Annual/Subsequent preventive examination.    This visit is being conducted through telemedicine due to the COVID-19 pandemic. This patient has given me verbal consent via doximity to conduct this visit, patient states they are participating from their home address. Some vital signs may be absent or patient reported.    Patient identification: identified by name, DOB, and current address  Review of Systems:  N/A  Cardiac Risk Factors include: male gender;hypertension;dyslipidemia     Objective:    Vitals: There were no vitals taken for this visit.  There is no height or weight on file to calculate BMI. Unable to obtain vitals due to visit being conducted via telephonically.   Advanced Directives 09/09/2019 08/29/2018 08/01/2018  Does Patient Have a Medical Advance Directive? Yes No No  Type of Paramedic of [place];Living will - -  Copy of [place] in Chart? No - copy requested - -  Would patient like information on creating a medical advance directive? - No - Patient declined -    Tobacco Social History   Tobacco Use  Smoking Status Never Smoker  Smokeless Tobacco Never Used     Counseling given: Not Answered   Clinical Intake:  Pre-visit preparation completed: Yes  Pain Score: 0-No pain     Nutritional Risks: None Diabetes: No  How often do you need to have someone help you when you read instructions, pamphlets, or other written materials from your doctor or pharmacy?: 1 - Never  Interpreter Needed?: No  Information entered by :: [facility], LPN  Past Medical History:  Diagnosis Date  . Cataract   . Hypertension   . Thyroid disease    Past Surgical History:  Procedure Laterality Date  . EYE SURGERY    . HERNIA REPAIR  1998   abdominal   Family History  Problem Relation Age of Onset  . Healthy Mother   . Healthy Father   . Colon  cancer Neg Hx   . Prostate cancer Neg Hx    Social History   Socioeconomic History  . Marital status: Married    Spouse name: Tommie  . Number of children: 2  . Years of education: Not on file  . Highest education level: Associate degree: occupational, Hotel manager, or vocational program  Occupational History  . Occupation: President/CEO    Comment: Sport and exercise psychologist  Tobacco Use  . Smoking status: Never Smoker  . Smokeless tobacco: Never Used  Substance and Sexual Activity  . Alcohol use: No  . Drug use: No  . Sexual activity: Yes    Partners: Female  Other Topics Concern  . Not on file  Social History Narrative  . Not on file   Social Determinants of Health   Financial Resource Strain:   . Difficulty of Paying Living Expenses: Not on file  Food Insecurity:   . Worried About Charity fundraiser in the Last Year: Not on file  . Ran Out of Food in the Last Year: Not on file  Transportation Needs:   . Lack of Transportation (Medical): Not on file  . Lack of Transportation (Non-Medical): Not on file  Physical Activity:   . Days of Exercise per Week: Not on file  . Minutes of Exercise per Session: Not on file  Stress:   . Feeling of Stress : Not on file  Social Connections:   . Frequency of Communication with  Friends and Family: Not on file  . Frequency of Social Gatherings with Friends and Family: Not on file  . Attends Religious Services: Not on file  . Active Member of Clubs or Organizations: Not on file  . Attends Archivist Meetings: Not on file  . Marital Status: Not on file    Outpatient Encounter Medications as of 09/09/2019  Medication Sig  . Alpha-Lipoic Acid 600 MG CAPS Take 1 capsule by mouth 2 (two) times daily. Taking at 4:00 am and 4:00 pm.  . amLODipine-benazepril (LOTREL) 5-10 MG capsule TAKE 1 CAPSULE EVERY DAY  . ARMOUR THYROID 60 MG tablet TAKE ONE TABLET BY MOUTH EVERY DAY BEFORE BREAKFAST  . Biotin 5000 MCG CAPS Take 1  capsule by mouth daily. Taking at 1:00 am  . Carnosine POWD Take 500 mg by mouth 2 (two) times daily. Taking at 2:00 am and 1:00 pm.  . Cholecalciferol (VITAMIN D3) 10000 units TABS Take 1 tablet by mouth daily. Taking at 1:00 am  . Cinnamon 500 MG capsule Take 500 mg by mouth 2 (two) times daily. Taking at 1:00 am and 1:00 pm.  . CITICOLINE SODIUM PO Take 250 mg by mouth 2 (two) times daily. Taking at 1:00 am and 1:00 pm  . CITRULLINE PO Take by mouth daily. Taking at 3:00 pm.  . Coenzyme Q10 (COQ-10) 50 MG CAPS Take 50 mg by mouth daily. Taking at 1:00 am.  . DHEA 50 MG TABS Take 1 tablet by mouth daily. Taking at 1:00 am.  . Flaxseed, Linseed, (FLAX SEED OIL PO) Take 1,250 mg by mouth 2 (two) times daily. Flax Seed Plus Omega 3. Taking at 1:00 am and 1:00 pm.  . GARLIC PO Take 0000000 mg by mouth 2 (two) times daily. Aged Garlic Extract. Taking at 2:00 am and 2:00 pm  . Ginkgo Biloba 40 MG TABS Take 120 mg by mouth 2 (two) times daily. Taking at 1:00 am and 1:00 pm.  . Glucosamine Sulfate 1000 MG TABS Take 1 tablet by mouth 2 (two) times daily. 1 tablet at 1:00 am, and 1 tablet at 1:00 pm  . Glutamine 500 MG CAPS Take 1 capsule by mouth 2 (two) times daily. Taking at 1:00 am and 1:00 pm  . Glycerylphosphorylcholine (ALPHA-GPC 50%) POWD Take 300 mg by mouth 2 (two) times daily. Taking at 1:00 am and 1:00 pm  . Gymnema Sylvestris Leaf POWD Take 400 mg by mouth 2 (two) times daily. Taking at 1:00 am and 1:00 pm  . Lycopene 15 MG CAPS Take 1 capsule by mouth daily. Taking at 1:00 am.  . Menaquinone-7 (VITAMIN K2 PO) Take 2,700 mcg by mouth daily. Advanced K2 Complex. Taking at 1:00 am.  . milk thistle 175 MG tablet Take 175 mg by mouth 2 (two) times daily. Taking at 1:00 am and 1:00 pm  . Misc Natural Products (BETA-SITOSTEROL PLANT STEROLS) CAPS Take 60 mg by mouth 2 (two) times daily. Taking at 3:00 and 3:00 pm  . Misc Natural Products (PROSTATE HEALTH PO) Take 1 tablet by mouth 2 (two) times daily.  Taking at 1:00 am and 1:00 pm  . Nutritional Supplements (PYCNOGENOL PO) Take 100 mg by mouth 2 (two) times daily. Taking at 1:00 am and 1:00 pm.  . OVER THE COUNTER MEDICATION Take 750 mg by mouth 2 (two) times daily. Curamed. Taking 1 tablet at 1:00 am, and 1 tablet at 1:00 pm.  . OVER THE COUNTER MEDICATION Take 30 mg by mouth daily. Optizinc.  Taking at 1:00 am  . Pantothenic Acid 500 MG TABS Take 1 tablet by mouth daily. Taking at 1:00 am  . RESVERATROL PO Take 500 mg by mouth daily. Taking at 1:00 am.  . rosuvastatin (CRESTOR) 5 MG tablet Take 5 mg by mouth daily.  . tadalafil (CIALIS) 5 MG tablet TAKE ONE TABLET EVERY DAY  . triamcinolone ointment (KENALOG) 0.5 % Apply 1 application topically 2 (two) times daily.  . vitamin E 400 UNIT capsule Take 400 Units by mouth daily. Taking at 1:00 am  . Vitamin Mixture (ESTER-C PO) Take 1,000 mg by mouth daily. Taking at 1:00 am  . Acetylcarnitine HCl (ACETYL L-CARNITINE PO) Take 2 g by mouth 2 (two) times daily. Taking at 4:00 am and 4:00 pm  . Acetylcysteine (N-ACETYL-L-CYSTEINE) 600 MG CAPS Take 1 capsule by mouth daily. Taking at 1:00 am  . Ashwagandha Extract 2.5 % POWD Take 470 mg by mouth 2 (two) times daily. Taking at 2:00 am and 2:00 pm.  . ASTAXANTHIN PO Take 12 mg by mouth daily. Taking at 1:00 ma  . beta carotene 25000 UNIT capsule Take 25,000 Units by mouth daily. Taking at 1:00 am  . Beta Glucan POWD Take 200 mg by mouth 2 (two) times daily. Taking at 1:00 am and 1:00 pm  . cyclobenzaprine (FLEXERIL) 5 MG tablet Take 1 tablet (5 mg total) by mouth at bedtime. (Patient not taking: Reported on 09/09/2019)  . gabapentin (NEURONTIN) 300 MG capsule Take 1 capsule (300 mg total) by mouth 3 (three) times daily.  Nyoka Cowden Tea, Camellia sinensis, (GREEN TEA EXTRACT PO) Take 750 mg by mouth daily. Taking at 1:00 am  . Levomefolate Glucosamine (METHYLFOLATE PO) Take 1,000 mcg by mouth daily. Taking at 1:00 am.  . OVER THE COUNTER MEDICATION Take  300 mg by mouth 2 (two) times daily. Ellagic Active. Taking at 1:00 am and 1:00 pm.  . OVER THE COUNTER MEDICATION 2 g 2 (two) times daily. propionyl-l-carnitine. Taking at 4:00 am and 4:00 pm.  . OVER THE COUNTER MEDICATION Take 250,000 Units by mouth daily. Taking at 6:00 am  . PHOSPHATIDYLSERINE PO Take 300 mg by mouth daily. Taking at 1:00 am  . predniSONE (STERAPRED UNI-PAK 48 TAB) 10 MG (48) TBPK tablet Take 6 pills on day 1, 5 pills on day 2, 4 pills on day 3 and so on until complete. (Patient not taking: Reported on 09/09/2019)  . pyridoxine (B-6) 100 MG tablet Take 100 mg by mouth daily. Taking at 1:00 am.  . QUERCETIN PO Take by mouth 2 (two) times daily. Taking at 2:00 am and 2:00 pm.  . Tyrosine 500 MG TABS Take 1 tablet by mouth daily. Taking at 2:00 am   No facility-administered encounter medications on file as of 09/09/2019.    Activities of Daily Living In your present state of health, do you have any difficulty performing the following activities: 09/09/2019  Hearing? N  Vision? N  Difficulty concentrating or making decisions? N  Walking or climbing stairs? N  Dressing or bathing? N  Doing errands, shopping? N  Preparing Food and eating ? N  Using the Toilet? N  In the past six months, have you accidently leaked urine? N  Do you have problems with loss of bowel control? N  Managing your Medications? N  Managing your Finances? N  Housekeeping or managing your Housekeeping? N  Some recent data might be hidden    Patient Care Team: Virginia Crews, MD as PCP -  General (Family Medicine) Corey Skains, MD as Consulting Physician (Cardiology) Royston Cowper, MD as Consulting Physician (Urology)   Assessment:   This is a routine wellness examination for Romain.  Exercise Activities and Dietary recommendations Current Exercise Habits: Structured exercise class, Type of exercise: strength training/weights;stretching;walking, Time (Minutes): 60, Frequency  (Times/Week): 2, Weekly Exercise (Minutes/Week): 120, Intensity: Moderate, Exercise limited by: None identified  Goals    . DIET - INCREASE WATER INTAKE     Recommend to drink at least 6-8 8oz glasses of water per day.        Fall Risk: Fall Risk  09/09/2019 08/29/2018 02/27/2018  Falls in the past year? 0 0 No  Number falls in past yr: 0 - -  Injury with Fall? 0 - -    FALL RISK PREVENTION PERTAINING TO THE HOME:  Any stairs in or around the home? Yes  If so, are there any without handrails? No   Home free of loose throw rugs in walkways, pet beds, electrical cords, etc? Yes  Adequate lighting in your home to reduce risk of falls? Yes   ASSISTIVE DEVICES UTILIZED TO PREVENT FALLS:  Life alert? No  Use of a cane, walker or w/c? No  Grab bars in the bathroom? Yes  Shower chair or bench in shower? Yes  Elevated toilet seat or a handicapped toilet? No   TIMED UP AND GO:  Was the test performed? No .    Depression Screen PHQ 2/9 Scores 09/09/2019 08/29/2018 02/27/2018  PHQ - 2 Score 0 0 0    Cognitive Function: Declined today.         Immunization History  Administered Date(s) Administered  . Fluad Quad(high Dose 65+) 06/04/2019  . Pneumococcal Conjugate-13 12/20/2016  . Pneumococcal Polysaccharide-23 02/27/2018  . Zoster Recombinat (Shingrix) 02/27/2018, 04/30/2018    Qualifies for Shingles Vaccine? Completed series  Tdap: Although this vaccine is not a covered service during a Wellness Exam, does the patient still wish to receive this vaccine today?  No .   Flu Vaccine: Up to date  Pneumococcal Vaccine: Completed series  Screening Tests Health Maintenance  Topic Date Due  . TETANUS/TDAP  09/08/2020 (Originally 01/26/1965)  . Fecal DNA (Cologuard)  08/08/2021  . INFLUENZA VACCINE  Completed  . Hepatitis C Screening  Completed  . PNA vac Low Risk Adult  Completed   Cancer Screenings:  Colorectal Screening: Cologuard completed 08/08/18. Repeat every 3  years.  Lung Cancer Screening: (Low Dose CT Chest recommended if Age 68-80 years, 30 pack-year currently smoking OR have quit w/in 15years.) does not qualify.   Additional Screening:  Hepatitis C Screening: Up to date  Vision Screening: Recommended annual ophthalmology exams for early detection of glaucoma and other disorders of the eye.  Dental Screening: Recommended annual dental exams for proper oral hygiene  Community Resource Referral:  CRR required this visit?  No        Plan:  I have personally reviewed and addressed the Medicare Annual Wellness questionnaire and have noted the following in the patient's chart:  A. Medical and social history B. Use of alcohol, tobacco or illicit drugs  C. Current medications and supplements D. Functional ability and status E.  Nutritional status F.  Physical activity G. Advance directives H. List of other physicians I.  Hospitalizations, surgeries, and ER visits in previous 12 months J.  [place] such as hearing and vision if needed, cognitive and depression L. Referrals and appointments   In addition,  I have reviewed and discussed with patient certain preventive protocols, quality metrics, and best practice recommendations. A written personalized care plan for preventive services as well as general preventive health recommendations were provided to patient.   Glendora Score, LPN  [id number] Nurse Health Advisor   Nurse Notes: None.

## 2019-09-09 ENCOUNTER — Other Ambulatory Visit: Payer: Self-pay

## 2019-09-09 ENCOUNTER — Ambulatory Visit (INDEPENDENT_AMBULATORY_CARE_PROVIDER_SITE_OTHER): Payer: Medicare Other

## 2019-09-09 DIAGNOSIS — Z0000 Encounter for general adult medical examination without abnormal findings: Secondary | ICD-10-CM | POA: Diagnosis not present

## 2019-09-09 NOTE — Patient Instructions (Addendum)
Gregory Mckay , Thank you for taking time to come for your Medicare Wellness Visit. I appreciate your ongoing commitment to your health goals. Please review the following plan we discussed and let me know if I can assist you in the future.   Screening recommendations/referrals: Colonoscopy: Cologuard up to date, due 07/2021 Recommended yearly ophthalmology/optometry visit for glaucoma screening and checkup Recommended yearly dental visit for hygiene and checkup  Vaccinations: Influenza vaccine: Up to date Pneumococcal vaccine: Completed series Tdap vaccine: Pt declines today.  Shingles vaccine: Completed series    Advanced directives: Please bring a copy of your POA (Power of Attorney) and/or Living Will to your next appointment.   Conditions/risks identified: Recommend to increase water intake to 6-8 8 oz glasses a day.   Next appointment: 09/10/19 @ 10:00 AM with Dr Brita Romp. Declined scheduling an AWV for 2021 at this time.   Preventive Care 72 Years and Older, Male Preventive care refers to lifestyle choices and visits with your health care provider that can promote health and wellness. What does preventive care include?  A yearly physical exam. This is also called an annual well check.  Dental exams once or twice a year.  Routine eye exams. Ask your health care provider how often you should have your eyes checked.  Personal lifestyle choices, including:  Daily care of your teeth and gums.  Regular physical activity.  Eating a healthy diet.  Avoiding tobacco and drug use.  Limiting alcohol use.  Practicing safe sex.  Taking low doses of aspirin every day.  Taking vitamin and mineral supplements as recommended by your health care provider. What happens during an annual well check? The services and screenings done by your health care provider during your annual well check will depend on your age, overall health, lifestyle risk factors, and family history of  disease. Counseling  Your health care provider may ask you questions about your:  Alcohol use.  Tobacco use.  Drug use.  Emotional well-being.  Home and relationship well-being.  Sexual activity.  Eating habits.  History of falls.  Memory and ability to understand (cognition).  Work and work Statistician. Screening  You may have the following tests or measurements:  Height, weight, and BMI.  Blood pressure.  Lipid and cholesterol levels. These may be checked every 5 years, or more frequently if you are over 75 years old.  Skin check.  Lung cancer screening. You may have this screening every year starting at age 72 if you have a 30-pack-year history of smoking and currently smoke or have quit within the past 15 years.  Fecal occult blood test (FOBT) of the stool. You may have this test every year starting at age 72.  Flexible sigmoidoscopy or colonoscopy. You may have a sigmoidoscopy every 5 years or a colonoscopy every 10 years starting at age 72.  Prostate cancer screening. Recommendations will vary depending on your family history and other risks.  Hepatitis C blood test.  Hepatitis B blood test.  Sexually transmitted disease (STD) testing.  Diabetes screening. This is done by checking your blood sugar (glucose) after you have not eaten for a while (fasting). You may have this done every 1-3 years.  Abdominal aortic aneurysm (AAA) screening. You may need this if you are a current or former smoker.  Osteoporosis. You may be screened starting at age 72 if you are at high risk. Talk with your health care provider about your test results, treatment options, and if necessary, the need for more  tests. Vaccines  Your health care provider may recommend certain vaccines, such as:  Influenza vaccine. This is recommended every year.  Tetanus, diphtheria, and acellular pertussis (Tdap, Td) vaccine. You may need a Td booster every 10 years.  Zoster vaccine. You may  need this after age 72.  Pneumococcal 13-valent conjugate (PCV13) vaccine. One dose is recommended after age 72.  Pneumococcal polysaccharide (PPSV23) vaccine. One dose is recommended after age 72. Talk to your health care provider about which screenings and vaccines you need and how often you need them. This information is not intended to replace advice given to you by your health care provider. Make sure you discuss any questions you have with your health care provider. Document Released: 10/09/2015 Document Revised: 06/01/2016 Document Reviewed: 07/14/2015 Elsevier Interactive Patient Education  2017 [place] Prevention in the Home Falls can cause injuries. They can happen to people of all ages. There are many things you can do to make your home safe and to help prevent falls. What can I do on the outside of my home?  Regularly fix the edges of walkways and driveways and fix any cracks.  Remove anything that might make you trip as you walk through a door, such as a raised step or threshold.  Trim any bushes or trees on the path to your home.  Use bright outdoor lighting.  Clear any walking paths of anything that might make someone trip, such as rocks or tools.  Regularly check to see if handrails are loose or broken. Make sure that both sides of any steps have handrails.  Any raised decks and porches should have guardrails on the edges.  Have any leaves, snow, or ice cleared regularly.  Use sand or salt on walking paths during winter.  Clean up any spills in your garage right away. This includes oil or grease spills. What can I do in the bathroom?  Use night lights.  Install grab bars by the toilet and in the tub and shower. Do not use towel bars as grab bars.  Use non-skid mats or decals in the tub or shower.  If you need to sit down in the shower, use a plastic, non-slip stool.  Keep the floor dry. Clean up any water that spills on the floor as soon as it  happens.  Remove soap buildup in the tub or shower regularly.  Attach bath mats securely with double-sided non-slip rug tape.  Do not have throw rugs and other things on the floor that can make you trip. What can I do in the bedroom?  Use night lights.  Make sure that you have a light by your bed that is easy to reach.  Do not use any sheets or blankets that are too big for your bed. They should not hang down onto the floor.  Have a firm chair that has side arms. You can use this for support while you get dressed.  Do not have throw rugs and other things on the floor that can make you trip. What can I do in the kitchen?  Clean up any spills right away.  Avoid walking on wet floors.  Keep items that you use a lot in easy-to-reach places.  If you need to reach something above you, use a strong step stool that has a grab bar.  Keep electrical cords out of the way.  Do not use floor polish or wax that makes floors slippery. If you must use wax, use non-skid floor  wax.  Do not have throw rugs and other things on the floor that can make you trip. What can I do with my stairs?  Do not leave any items on the stairs.  Make sure that there are handrails on both sides of the stairs and use them. Fix handrails that are broken or loose. Make sure that handrails are as long as the stairways.  Check any carpeting to make sure that it is firmly attached to the stairs. Fix any carpet that is loose or worn.  Avoid having throw rugs at the top or bottom of the stairs. If you do have throw rugs, attach them to the floor with carpet tape.  Make sure that you have a light switch at the top of the stairs and the bottom of the stairs. If you do not have them, ask someone to add them for you. What else can I do to help prevent falls?  Wear shoes that:  Do not have high heels.  Have rubber bottoms.  Are comfortable and fit you well.  Are closed at the toe. Do not wear sandals.  If you  use a stepladder:  Make sure that it is fully opened. Do not climb a closed stepladder.  Make sure that both sides of the stepladder are locked into place.  Ask someone to hold it for you, if possible.  Clearly mark and make sure that you can see:  Any grab bars or handrails.  First and last steps.  Where the edge of each step is.  Use tools that help you move around (mobility aids) if they are needed. These include:  Canes.  Walkers.  Scooters.  Crutches.  Turn on the lights when you go into a dark area. Replace any light bulbs as soon as they burn out.  Set up your furniture so you have a clear path. Avoid moving your furniture around.  If any of your floors are uneven, fix them.  If there are any pets around you, be aware of where they are.  Review your medicines with your doctor. Some medicines can make you feel dizzy. This can increase your chance of falling. Ask your doctor what other things that you can do to help prevent falls. This information is not intended to replace advice given to you by your health care provider. Make sure you discuss any questions you have with your health care provider. Document Released: 07/09/2009 Document Revised: 02/18/2016 Document Reviewed: 10/17/2014 Elsevier Interactive Patient Education  2017 [employer].

## 2019-09-10 ENCOUNTER — Encounter: Payer: Self-pay | Admitting: Family Medicine

## 2019-09-10 ENCOUNTER — Ambulatory Visit: Payer: Self-pay

## 2019-09-10 ENCOUNTER — Ambulatory Visit (INDEPENDENT_AMBULATORY_CARE_PROVIDER_SITE_OTHER): Payer: Medicare Other | Admitting: Family Medicine

## 2019-09-10 ENCOUNTER — Other Ambulatory Visit: Payer: Self-pay

## 2019-09-10 VITALS — BP 135/71 | HR 74 | Temp 97.1°F | Resp 16 | Ht 71.0 in | Wt 201.0 lb

## 2019-09-10 DIAGNOSIS — E039 Hypothyroidism, unspecified: Secondary | ICD-10-CM

## 2019-09-10 DIAGNOSIS — Z0000 Encounter for general adult medical examination without abnormal findings: Secondary | ICD-10-CM | POA: Diagnosis not present

## 2019-09-10 DIAGNOSIS — I10 Essential (primary) hypertension: Secondary | ICD-10-CM | POA: Diagnosis not present

## 2019-09-10 DIAGNOSIS — R972 Elevated prostate specific antigen [PSA]: Secondary | ICD-10-CM | POA: Diagnosis not present

## 2019-09-10 DIAGNOSIS — E785 Hyperlipidemia, unspecified: Secondary | ICD-10-CM

## 2019-09-10 NOTE — Assessment & Plan Note (Signed)
Has seen urology and reports MRI was normal and he does not need repeat PSA

## 2019-09-10 NOTE — Assessment & Plan Note (Signed)
Chronic and stable Continue Crestor at current dose Repeat FLP and CMP

## 2019-09-10 NOTE — Progress Notes (Signed)
Patient: Gregory Mckay, Male    DOB: [birth year], 73 y.o.   MRN: [id number] Visit Date: 09/10/2019  Today's Provider: Lavon Paganini, MD   Chief Complaint  Patient presents with  . Annual Exam   Subjective:     Patient had AWV with [facility] 09/09/2019   Complete Physical Gregory Mckay is a 73 y.o. male. He feels well. He reports exercising, strength/training, stretching and walking. He reports he is sleeping well.  ----------------------------------------------------------- Patient is followed by Urology for his elevated PSA. Patient had an MRI and showed no cancer per patient. Reports that we do not need to trend PSA.   Review of Systems  Constitutional: Negative.   HENT: Negative.   Eyes: Negative.   Respiratory: Negative.   Cardiovascular: Negative.   Gastrointestinal: Negative.   Endocrine: Negative.   Genitourinary: Negative.   Musculoskeletal: Negative.   Skin: Negative.   Allergic/Immunologic: Negative.   Neurological: Negative.   Hematological: Negative.   Psychiatric/Behavioral: Negative.     Social History   Socioeconomic History  . Marital status: Married    Spouse name: Tommie  . Number of children: 2  . Years of education: Not on file  . Highest education level: Associate degree: occupational, Hotel manager, or vocational program  Occupational History  . Occupation: President/CEO    Comment: Sport and exercise psychologist  Tobacco Use  . Smoking status: Never Smoker  . Smokeless tobacco: Never Used  Substance and Sexual Activity  . Alcohol use: No  . Drug use: No  . Sexual activity: Yes    Partners: Female  Other Topics Concern  . Not on file  Social History Narrative  . Not on file   Social Determinants of Health   Financial Resource Strain:   . Difficulty of Paying Living Expenses: Not on file  Food Insecurity:   . Worried About Charity fundraiser in the Last Year: Not on file  . Ran Out of Food in the Last Year: Not on file    Transportation Needs:   . Lack of Transportation (Medical): Not on file  . Lack of Transportation (Non-Medical): Not on file  Physical Activity:   . Days of Exercise per Week: Not on file  . Minutes of Exercise per Session: Not on file  Stress:   . Feeling of Stress : Not on file  Social Connections:   . Frequency of Communication with Friends and Family: Not on file  . Frequency of Social Gatherings with Friends and Family: Not on file  . Attends Religious Services: Not on file  . Active Member of Clubs or Organizations: Not on file  . Attends Archivist Meetings: Not on file  . Marital Status: Not on file  Intimate Partner Violence:   . Fear of Current or Ex-Partner: Not on file  . Emotionally Abused: Not on file  . Physically Abused: Not on file  . Sexually Abused: Not on file    Past Medical History:  Diagnosis Date  . Cataract   . Hypertension   . Thyroid disease      Patient Active Problem List   Diagnosis Date Noted  . Hyperlipidemia 09/06/2018  . 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 Hypertension 02/27/2018  . Elevated PSA 02/27/2018  . Hypothyroidism 02/27/2018    Past Surgical History:  Procedure Laterality Date  . EYE SURGERY    . HERNIA REPAIR  1998   abdominal    His family history includes Healthy in his father  and mother. There is no history of Colon cancer or Prostate cancer.   Current Outpatient Medications:  .  Acetylcarnitine HCl (ACETYL L-CARNITINE PO), Take 2 g by mouth 2 (two) times daily. Taking at 4:00 am and 4:00 pm, Disp: , Rfl:  .  Acetylcysteine (N-ACETYL-L-CYSTEINE) 600 MG CAPS, Take 1 capsule by mouth daily. Taking at 1:00 am, Disp: , Rfl:  .  Alpha-Lipoic Acid 600 MG CAPS, Take 1 capsule by mouth 2 (two) times daily. Taking at 4:00 am and 4:00 pm., Disp: , Rfl:  .  amLODipine-benazepril (LOTREL) 5-10 MG capsule, TAKE 1 CAPSULE EVERY DAY, Disp: 90 capsule, Rfl: 3 .  ARMOUR THYROID 60 MG tablet, TAKE ONE TABLET BY MOUTH EVERY  DAY BEFORE BREAKFAST, Disp: 90 tablet, Rfl: 1 .  Ashwagandha Extract 2.5 % POWD, Take 470 mg by mouth 2 (two) times daily. Taking at 2:00 am and 2:00 pm., Disp: , Rfl:  .  ASTAXANTHIN PO, Take 12 mg by mouth daily. Taking at 1:00 ma, Disp: , Rfl:  .  beta carotene 25000 UNIT capsule, Take 25,000 Units by mouth daily. Taking at 1:00 am, Disp: , Rfl:  .  Beta Glucan POWD, Take 200 mg by mouth 2 (two) times daily. Taking at 1:00 am and 1:00 pm, Disp: , Rfl:  .  Biotin 5000 MCG CAPS, Take 1 capsule by mouth daily. Taking at 1:00 am, Disp: , Rfl:  .  Carnosine POWD, Take 500 mg by mouth 2 (two) times daily. Taking at 2:00 am and 1:00 pm., Disp: , Rfl:  .  Cholecalciferol (VITAMIN D3) 10000 units TABS, Take 1 tablet by mouth daily. Taking at 1:00 am, Disp: , Rfl:  .  Cinnamon 500 MG capsule, Take 500 mg by mouth 2 (two) times daily. Taking at 1:00 am and 1:00 pm., Disp: , Rfl:  .  CITICOLINE SODIUM PO, Take 250 mg by mouth 2 (two) times daily. Taking at 1:00 am and 1:00 pm, Disp: , Rfl:  .  CITRULLINE PO, Take by mouth daily. Taking at 3:00 pm., Disp: , Rfl:  .  Coenzyme Q10 (COQ-10) 50 MG CAPS, Take 50 mg by mouth daily. Taking at 1:00 am., Disp: , Rfl:  .  DHEA 50 MG TABS, Take 1 tablet by mouth daily. Taking at 1:00 am., Disp: , Rfl:  .  Flaxseed, Linseed, (FLAX SEED OIL PO), Take 1,250 mg by mouth 2 (two) times daily. Flax Seed Plus Omega 3. Taking at 1:00 am and 1:00 pm., Disp: , Rfl:  .  gabapentin (NEURONTIN) 300 MG capsule, Take 1 capsule (300 mg total) by mouth 3 (three) times daily., Disp: 90 capsule, Rfl: 0 .  GARLIC PO, Take 0000000 mg by mouth 2 (two) times daily. Aged Garlic Extract. Taking at 2:00 am and 2:00 pm, Disp: , Rfl:  .  Ginkgo Biloba 40 MG TABS, Take 120 mg by mouth 2 (two) times daily. Taking at 1:00 am and 1:00 pm., Disp: , Rfl:  .  Glucosamine Sulfate 1000 MG TABS, Take 1 tablet by mouth 2 (two) times daily. 1 tablet at 1:00 am, and 1 tablet at 1:00 pm, Disp: , Rfl:  .  Glutamine  500 MG CAPS, Take 1 capsule by mouth 2 (two) times daily. Taking at 1:00 am and 1:00 pm, Disp: , Rfl:  .  Glycerylphosphorylcholine (ALPHA-GPC 50%) POWD, Take 300 mg by mouth 2 (two) times daily. Taking at 1:00 am and 1:00 pm, Disp: , Rfl:  .  Green Tea, Camellia sinensis, (GREEN  TEA EXTRACT PO), Take 750 mg by mouth daily. Taking at 1:00 am, Disp: , Rfl:  .  Gymnema Sylvestris Leaf POWD, Take 400 mg by mouth 2 (two) times daily. Taking at 1:00 am and 1:00 pm, Disp: , Rfl:  .  Levomefolate Glucosamine (METHYLFOLATE PO), Take 1,000 mcg by mouth daily. Taking at 1:00 am., Disp: , Rfl:  .  Lycopene 15 MG CAPS, Take 1 capsule by mouth daily. Taking at 1:00 am., Disp: , Rfl:  .  Menaquinone-7 (VITAMIN K2 PO), Take 2,700 mcg by mouth daily. Advanced K2 Complex. Taking at 1:00 am., Disp: , Rfl:  .  milk thistle 175 MG tablet, Take 175 mg by mouth 2 (two) times daily. Taking at 1:00 am and 1:00 pm, Disp: , Rfl:  .  Misc Natural Products (BETA-SITOSTEROL PLANT STEROLS) CAPS, Take 60 mg by mouth 2 (two) times daily. Taking at 3:00 and 3:00 pm, Disp: , Rfl:  .  Misc Natural Products (PROSTATE HEALTH PO), Take 1 tablet by mouth 2 (two) times daily. Taking at 1:00 am and 1:00 pm, Disp: , Rfl:  .  Nutritional Supplements (PYCNOGENOL PO), Take 100 mg by mouth 2 (two) times daily. Taking at 1:00 am and 1:00 pm., Disp: , Rfl:  .  OVER THE COUNTER MEDICATION, Take 750 mg by mouth 2 (two) times daily. Curamed. Taking 1 tablet at 1:00 am, and 1 tablet at 1:00 pm., Disp: , Rfl:  .  OVER THE COUNTER MEDICATION, Take 300 mg by mouth 2 (two) times daily. Ellagic Active. Taking at 1:00 am and 1:00 pm., Disp: , Rfl:  .  OVER THE COUNTER MEDICATION, 2 g 2 (two) times daily. propionyl-l-carnitine. Taking at 4:00 am and 4:00 pm., Disp: , Rfl:  .  OVER THE COUNTER MEDICATION, Take 30 mg by mouth daily. Optizinc. Taking at 1:00 am, Disp: , Rfl:  .  OVER THE COUNTER MEDICATION, Take 250,000 Units by mouth daily. Taking at 6:00 am,  Disp: , Rfl:  .  Pantothenic Acid 500 MG TABS, Take 1 tablet by mouth daily. Taking at 1:00 am, Disp: , Rfl:  .  PHOSPHATIDYLSERINE PO, Take 300 mg by mouth daily. Taking at 1:00 am, Disp: , Rfl:  .  pyridoxine (B-6) 100 MG tablet, Take 100 mg by mouth daily. Taking at 1:00 am., Disp: , Rfl:  .  QUERCETIN PO, Take by mouth 2 (two) times daily. Taking at 2:00 am and 2:00 pm., Disp: , Rfl:  .  RESVERATROL PO, Take 500 mg by mouth daily. Taking at 1:00 am., Disp: , Rfl:  .  tadalafil (CIALIS) 5 MG tablet, TAKE ONE TABLET EVERY DAY, Disp: 90 tablet, Rfl: 3 .  triamcinolone ointment (KENALOG) 0.5 %, Apply 1 application topically 2 (two) times daily., Disp: 30 g, Rfl: 2 .  Tyrosine 500 MG TABS, Take 1 tablet by mouth daily. Taking at 2:00 am, Disp: , Rfl:  .  vitamin E 400 UNIT capsule, Take 400 Units by mouth daily. Taking at 1:00 am, Disp: , Rfl:  .  Vitamin Mixture (ESTER-C PO), Take 1,000 mg by mouth daily. Taking at 1:00 am, Disp: , Rfl:  .  cyclobenzaprine (FLEXERIL) 5 MG tablet, Take 1 tablet (5 mg total) by mouth at bedtime. (Patient not taking: Reported on 09/09/2019), Disp: 30 tablet, Rfl: 0 .  rosuvastatin (CRESTOR) 5 MG tablet, Take 5 mg by mouth daily., Disp: , Rfl:   Patient Care Team: Virginia Crews, MD as PCP - General (Family Medicine) Serafina Royals  J, MD as Consulting Physician (Cardiology) Royston Cowper, MD as Consulting Physician (Urology)     Objective:    Vitals: BP 135/71 (BP Location: Left Arm, Patient Position: Sitting, Cuff Size: Large)   Pulse 74   Temp (!) 97.1 F (36.2 C) (Temporal)   Resp 16   Ht 5\' 11"  (1.803 m)   Wt 201 lb (91.2 kg)   BMI 28.03 kg/m   Physical Exam Vitals reviewed.  Constitutional:      General: He is not in acute distress.    Appearance: Normal appearance. He is well-developed. He is not diaphoretic.  HENT:     Head: Normocephalic and atraumatic.     Right Ear: Tympanic membrane, ear canal and external ear normal.     Left  Ear: Tympanic membrane, ear canal and external ear normal.  Eyes:     General: No scleral icterus.    Extraocular Movements: Extraocular movements intact.     Conjunctiva/sclera: Conjunctivae normal.     Pupils: Pupils are equal, round, and reactive to light.  Neck:     Thyroid: No thyromegaly.  Cardiovascular:     Rate and Rhythm: Normal rate and regular rhythm.     Pulses: Normal pulses.     Heart sounds: Normal heart sounds. No murmur.  Pulmonary:     Effort: Pulmonary effort is normal. No respiratory distress.     Breath sounds: Normal breath sounds. No wheezing or rales.  Abdominal:     General: There is no distension.     Palpations: Abdomen is soft.     Tenderness: There is no abdominal tenderness.  Musculoskeletal:        General: No deformity. Normal range of motion.     Cervical back: Normal range of motion and neck supple.     Right lower leg: No edema.     Left lower leg: No edema.  Lymphadenopathy:     Cervical: No cervical adenopathy.  Skin:    General: Skin is warm and dry.     Capillary Refill: Capillary refill takes less than 2 seconds.     Findings: No rash.  Neurological:     Mental Status: He is alert and oriented to person, place, and time. Mental status is at baseline.  Psychiatric:        Mood and Affect: Mood normal.        Behavior: Behavior normal.     Activities of Daily Living In your present state of health, do you have any difficulty performing the following activities: 09/09/2019  Hearing? N  Vision? N  Difficulty concentrating or making decisions? N  Walking or climbing stairs? N  Dressing or bathing? N  Doing errands, shopping? N  Preparing Food and eating ? N  Using the Toilet? N  In the past six months, have you accidently leaked urine? N  Do you have problems with loss of bowel control? N  Managing your Medications? N  Managing your Finances? N  Housekeeping or managing your Housekeeping? N  Some recent data might be hidden     Fall Risk Assessment Fall Risk  09/09/2019 08/29/2018 02/27/2018  Falls in the past year? 0 0 No  Number falls in past yr: 0 - -  Injury with Fall? 0 - -     Depression Screen PHQ 2/9 Scores 09/09/2019 08/29/2018 02/27/2018  PHQ - 2 Score 0 0 0   Cognitive Function: Declined yesterday 09/09/2019. No flowsheet data found.     Assessment &  Plan:    Annual Physical Reviewed patient's Family Medical History Reviewed and updated list of patient's medical providers Assessment of cognitive impairment was done Assessed patient's functional ability Established a written schedule for health screening [place] Completed and Reviewed  Exercise Activities and Dietary recommendations Goals    . DIET - INCREASE WATER INTAKE     Recommend to drink at least 6-8 8oz glasses of water per day.        Immunization History  Administered Date(s) Administered  . Fluad Quad(high Dose 65+) 06/04/2019  . Pneumococcal Conjugate-13 12/20/2016  . Pneumococcal Polysaccharide-23 02/27/2018  . Zoster Recombinat (Shingrix) 02/27/2018, 04/30/2018    Health Maintenance  Topic Date Due  . TETANUS/TDAP  09/08/2020 (Originally 01/26/1965)  . Fecal DNA (Cologuard)  08/08/2021  . INFLUENZA VACCINE  Completed  . Hepatitis C Screening  Completed  . PNA vac Low Risk Adult  Completed     Discussed health benefits of physical activity, and encouraged him to engage in regular exercise appropriate for his age and condition.    ------------------------------------------------------------------------------------------------------------  Problem List Items Addressed This Visit      Cardiovascular and Mediastinum   Hypertension    Well controlled Continue current medications Recheck metabolic panel F/u in 6 months       Relevant Orders   Comprehensive Metabolic Panel (CMET)     Endocrine   Hypothyroidism    Previously well controlled Has never taken Synthroid Taking Armour  thyroid Recheck TSH      Relevant Orders   TSH     Other   Elevated PSA    Has seen urology and reports MRI was normal and he does not need repeat PSA      Hyperlipidemia    Chronic and stable Continue Crestor at current dose Repeat FLP and CMP      Relevant Orders   Comprehensive Metabolic Panel (CMET)   Lipid Profile    Other Visit Diagnoses    Encounter for annual physical exam    -  Primary   Relevant Orders   Comprehensive Metabolic Panel (CMET)   TSH   Lipid Profile       Return in about 6 months (around 03/10/2020) for chronic disease f/u.   The entirety of the information documented in the History of Present Illness, Review of Systems and Physical Exam were personally obtained by me. Portions of this information were initially documented by Lyndel Pleasure, CMA and reviewed by me for thoroughness and accuracy.    Iseah Plouff, Dionne Bucy, MD MPH [facility]

## 2019-09-10 NOTE — Patient Instructions (Signed)

## 2019-09-10 NOTE — Assessment & Plan Note (Signed)
Previously well controlled Has never taken Synthroid Taking Armour thyroid Recheck TSH

## 2019-09-10 NOTE — Assessment & Plan Note (Signed)
Well controlled Continue current medications Recheck metabolic panel F/u in 6 months  

## 2019-09-12 DIAGNOSIS — I10 Essential (primary) hypertension: Secondary | ICD-10-CM | POA: Diagnosis not present

## 2019-09-12 DIAGNOSIS — E785 Hyperlipidemia, unspecified: Secondary | ICD-10-CM | POA: Diagnosis not present

## 2019-09-12 DIAGNOSIS — Z0000 Encounter for general adult medical examination without abnormal findings: Secondary | ICD-10-CM | POA: Diagnosis not present

## 2019-09-12 DIAGNOSIS — E039 Hypothyroidism, unspecified: Secondary | ICD-10-CM | POA: Diagnosis not present

## 2019-09-13 ENCOUNTER — Telehealth: Payer: Self-pay

## 2019-09-13 LAB — COMPREHENSIVE METABOLIC PANEL
ALT: 17 IU/L (ref 0–44)
AST: 16 IU/L (ref 0–40)
Albumin/Globulin Ratio: 2 (ref 1.2–2.2)
Albumin: 4.5 g/dL (ref 3.7–4.7)
Alkaline Phosphatase: 45 IU/L (ref 39–117)
BUN/Creatinine Ratio: 14 (ref 10–24)
BUN: 17 mg/dL (ref 8–27)
Bilirubin Total: 0.4 mg/dL (ref 0.0–1.2)
CO2: 23 mmol/L (ref 20–29)
Calcium: 10.1 mg/dL (ref 8.6–10.2)
Chloride: 104 mmol/L (ref 96–106)
Creatinine, Ser: 1.21 mg/dL (ref 0.76–1.27)
GFR calc Af Amer: 69 mL/min/{1.73_m2} (ref >59)
GFR calc non Af Amer: 59 mL/min/{1.73_m2} — ABNORMAL LOW (ref >59)
Globulin, Total: 2.3 g/dL (ref 1.5–4.5)
Glucose: 101 mg/dL — ABNORMAL HIGH (ref 65–99)
Potassium: 4.4 mmol/L (ref 3.5–5.2)
Sodium: 141 mmol/L (ref 134–144)
Total Protein: 6.8 g/dL (ref 6.0–8.5)

## 2019-09-13 LAB — LIPID PANEL
Chol/HDL Ratio: 4.5 ratio (ref 0.0–5.0)
Cholesterol, Total: 125 mg/dL (ref 100–199)
HDL: 28 mg/dL — ABNORMAL LOW (ref >39)
LDL Chol Calc (NIH): 72 mg/dL (ref 0–99)
Triglycerides: 142 mg/dL (ref 0–149)
VLDL Cholesterol Cal: 25 mg/dL (ref 5–40)

## 2019-09-13 LAB — TSH: TSH: 2.26 u[IU]/mL (ref 0.450–4.500)

## 2019-09-13 NOTE — Telephone Encounter (Signed)
-----   Message from Virginia Crews, MD sent at 09/13/2019  8:16 AM EST ----- Normal labs, except blood sugar is slightly elevated if he was fasting.  We will recheck at next visit.

## 2019-09-13 NOTE — Telephone Encounter (Signed)
Pt advised.  He states he was fasting for his blood work.   Thanks,   -Mickel Baas

## 2019-10-08 DIAGNOSIS — C44712 Basal cell carcinoma of skin of right lower limb, including hip: Secondary | ICD-10-CM | POA: Diagnosis not present

## 2019-10-08 DIAGNOSIS — C44612 Basal cell carcinoma of skin of right upper limb, including shoulder: Secondary | ICD-10-CM | POA: Diagnosis not present

## 2019-10-08 DIAGNOSIS — C4491 Basal cell carcinoma of skin, unspecified: Secondary | ICD-10-CM

## 2019-10-08 HISTORY — DX: Basal cell carcinoma of skin, unspecified: C44.91

## 2019-10-17 ENCOUNTER — Ambulatory Visit: Payer: Medicare Other | Attending: Internal Medicine

## 2019-10-17 DIAGNOSIS — Z23 Encounter for immunization: Secondary | ICD-10-CM | POA: Insufficient documentation

## 2019-10-17 NOTE — Progress Notes (Signed)
   Covid-19 Vaccination Clinic  Name:  RAQUAN CARROL    MRN: [id number] DOB: [birth year]  10/17/2019  Mr. Braaten was observed post Covid-19 immunization for 15 minutes without incidence. He was provided with Vaccine Information Sheet and instruction to access the V-Safe system.   Mr. Kerbo was instructed to call 911 with any severe reactions post vaccine: Marland Kitchen Difficulty breathing  . Swelling of your face and throat  . A fast heartbeat  . A bad rash all over your body  . Dizziness and weakness    Immunizations Administered    Name Date Dose VIS Date Route   Pfizer COVID-19 Vaccine 10/17/2019  8:11 AM 0.3 mL 09/06/2019 Intramuscular   Manufacturer: [place]   Lot: [id number]   [place]: [id number]

## 2019-10-25 ENCOUNTER — Other Ambulatory Visit: Payer: Self-pay | Admitting: Family Medicine

## 2019-10-25 ENCOUNTER — Telehealth: Payer: Self-pay | Admitting: Family Medicine

## 2019-10-25 MED ORDER — ROSUVASTATIN CALCIUM 5 MG PO TABS: 5.0000 mg | ORAL_TABLET | Freq: Every day | ORAL | 1 refills | Status: DC

## 2019-10-25 NOTE — Telephone Encounter (Signed)
Refill for rovuastatin. Prescription by a historical provider.

## 2019-10-25 NOTE — Telephone Encounter (Signed)
Medication Refill - Medication: rosuvastatin (CRESTOR) 5 MG tablet(Expired)  Has the patient contacted their pharmacy? Yes - Dr. Nehemiah Massed can't see pt for another couple of weeks and will not refill - pt was told to contact PCP to see if they will refill in the meantime as pt is completely out. (Agent: If no, request that the patient contact the pharmacy for the refill.) (Agent: If yes, when and what did the pharmacy advise?)  Preferred Pharmacy (with phone number or street name):  [place], [place] - [place] Phone:  [phone]  Fax:  [phone]     Agent: Please be advised that RX refills may take up to 3 business days. We ask that you follow-up with your pharmacy.

## 2019-10-25 NOTE — Telephone Encounter (Signed)
Patient called in stating he is out and would like office to [facility]. Please advise.

## 2019-10-29 DIAGNOSIS — I10 Essential (primary) hypertension: Secondary | ICD-10-CM | POA: Diagnosis not present

## 2019-10-29 DIAGNOSIS — E782 Mixed hyperlipidemia: Secondary | ICD-10-CM | POA: Diagnosis not present

## 2019-11-02 DIAGNOSIS — Z0100 Encounter for examination of eyes and vision without abnormal findings: Secondary | ICD-10-CM | POA: Diagnosis not present

## 2019-11-02 DIAGNOSIS — H5212 Myopia, left eye: Secondary | ICD-10-CM | POA: Diagnosis not present

## 2019-11-04 ENCOUNTER — Ambulatory Visit: Payer: Medicare Other | Attending: Internal Medicine

## 2019-11-04 DIAGNOSIS — Z23 Encounter for immunization: Secondary | ICD-10-CM | POA: Insufficient documentation

## 2019-11-04 NOTE — Progress Notes (Signed)
   Covid-19 Vaccination Clinic  Name:  Gregory Mckay    MRN: [id number] DOB: 1946/01/09  11/04/2019  Mr. Gregory Mckay was observed post Covid-19 immunization for 15 minutes without incidence. He was provided with Vaccine Information Sheet and instruction to access the V-Safe system.   Mr. Gregory Mckay was instructed to call 911 with any severe reactions post vaccine: Marland Kitchen Difficulty breathing  . Swelling of your face and throat  . A fast heartbeat  . A bad rash all over your body  . Dizziness and weakness    Immunizations Administered    Name Date Dose VIS Date Route   Pfizer COVID-19 Vaccine 11/04/2019  8:33 AM 0.3 mL 09/06/2019 Intramuscular   Manufacturer: [place]   Lot: [id number]   [place]: [id number]

## 2019-11-05 DIAGNOSIS — L820 Inflamed seborrheic keratosis: Secondary | ICD-10-CM | POA: Diagnosis not present

## 2019-11-05 DIAGNOSIS — D485 Neoplasm of uncertain behavior of skin: Secondary | ICD-10-CM | POA: Diagnosis not present

## 2019-11-05 DIAGNOSIS — C44519 Basal cell carcinoma of skin of other part of trunk: Secondary | ICD-10-CM | POA: Diagnosis not present

## 2019-11-05 DIAGNOSIS — L578 Other skin changes due to chronic exposure to nonionizing radiation: Secondary | ICD-10-CM | POA: Diagnosis not present

## 2019-11-26 ENCOUNTER — Other Ambulatory Visit: Payer: Self-pay | Admitting: Family Medicine

## 2019-12-05 DIAGNOSIS — D2362 Other benign neoplasm of skin of left upper limb, including shoulder: Secondary | ICD-10-CM | POA: Diagnosis not present

## 2019-12-25 ENCOUNTER — Other Ambulatory Visit: Payer: Self-pay | Admitting: Family Medicine

## 2019-12-25 DIAGNOSIS — E039 Hypothyroidism, unspecified: Secondary | ICD-10-CM

## 2019-12-25 NOTE — Telephone Encounter (Signed)
Requested medication (s) are due for refill today: yes  Requested medication (s) are on the active medication list: yes  Last refill:  09/22/20  Future visit scheduled: yes  Notes to clinic:  insurance request for alternative med    Requested Prescriptions  Pending Prescriptions Disp Refills   amLODipine-benazepril (LOTREL) 5-10 MG capsule [Pharmacy Med Name: AMLODIPINE-BENAZEPRIL 5-10 MG CAP] 90 capsule 3    Sig: TAKE 1 CAPSULE EVERY DAY      Cardiovascular: CCB + ACEI Combos Passed - 12/25/2019  3:24 PM      Passed - Cr in normal range and within 180 days    Creatinine  Date Value Ref Range Status  12/15/2013 1.44 (H) 0.60 - 1.30 mg/dL Final   Creatinine, Ser  Date Value Ref Range Status  09/12/2019 1.21 0.76 - 1.27 mg/dL Final          Passed - K in normal range and within 180 days    Potassium  Date Value Ref Range Status  09/12/2019 4.4 3.5 - 5.2 mmol/L Final  12/15/2013 3.5 3.5 - 5.1 mmol/L Final          Passed - Patient is not pregnant      Passed - Last BP in normal range    BP Readings from Last 1 Encounters:  09/10/19 135/71          Passed - Valid encounter within last 6 months    Recent Outpatient Visits           3 months ago Encounter for annual physical exam   [facility], Dionne Bucy, MD   1 year ago Lumbar radiculopathy   [facility] [place], [place]   1 year ago Encounter for annual physical exam   [facility] [place], Dionne Bucy, MD   1 year ago Need for shingles vaccine   [facility], Dionne Bucy, MD   1 year ago Essential hypertension   [place], Dionne Bucy, MD       Future Appointments             Tomorrow Ralene Bathe, MD [place]   In 2 months Bacigalupo, Dionne Bucy, MD [facility], [place]

## 2019-12-25 NOTE — Telephone Encounter (Signed)
Please clarify. I this a refill request for the medication listed below, or did the insurance send you a fax or phone call about changing to an alternative medication?

## 2019-12-26 ENCOUNTER — Ambulatory Visit: Payer: Medicare Other | Admitting: Dermatology

## 2019-12-26 ENCOUNTER — Other Ambulatory Visit: Payer: Self-pay

## 2019-12-26 DIAGNOSIS — D230 Other benign neoplasm of skin of lip: Secondary | ICD-10-CM | POA: Diagnosis not present

## 2019-12-26 DIAGNOSIS — D485 Neoplasm of uncertain behavior of skin: Secondary | ICD-10-CM | POA: Diagnosis not present

## 2019-12-26 DIAGNOSIS — L918 Other hypertrophic disorders of the skin: Secondary | ICD-10-CM | POA: Diagnosis not present

## 2019-12-26 NOTE — Progress Notes (Signed)
   Follow-Up Visit   Subjective  Gregory Mckay is a 74 y.o. male who presents for the following: skin lesion (new skin lesion that first appeared a few weeks ago. Patient reports no symptoms other than it is new and irregular appearing.).  The following portions of the chart were reviewed this encounter and updated as appropriate:    Review of Systems: No other skin or systemic complaints.  Objective  Well appearing patient in no apparent distress; mood and affect are within normal limits.  A focused examination was performed including lip/oral commisure. Relevant physical exam findings are noted in the Assessment and Plan.  Objective  R lip oral commisure: Verrucous papule 0.5 cm   Assessment & Plan  Neoplasm of uncertain behavior of skin R lip oral commisure  Epidermal / dermal shaving  Lesion length (cm):  0.5 Lesion width (cm):  0.5 Margin per side (cm):  0.2 Total excision diameter (cm):  0.9 Informed consent: discussed and consent obtained   Timeout: patient name, date of birth, surgical site, and procedure verified   Procedure prep:  Patient was prepped and draped in usual sterile fashion Prep type:  Isopropyl alcohol Anesthesia: the lesion was anesthetized in a standard fashion   Anesthetic:  1% lidocaine w/ epinephrine 1-100,000 buffered w/ 8.4% NaHCO3 Instrument used: flexible razor blade   Hemostasis achieved with: pressure, aluminum chloride and electrodesiccation   Outcome: patient tolerated procedure well   Post-procedure details: sterile dressing applied and wound care instructions given   Dressing type: bandage and petrolatum    Specimen 1 - Surgical pathology Differential Diagnosis: D48.5 r/o wart vs other Check Margins: No Verrucous papule 0.5 cm  Wart vs other  Return if symptoms worsen or fail to improve.   Tanya Nones, CMA, am acting as scribe for Sarina Ser, MD .

## 2019-12-26 NOTE — Telephone Encounter (Signed)
Requested medication (s) are due for refill today: yes  Requested medication (s) are on the active medication list: yes  Last refill: 09/23/2019  Future visit scheduled:   Notes to clinic: insurance request for alternative medication  Why Am I Seeing These Alternatives?   amLODipine-benazepril (LOTREL) 5-10 MG capsule [Pharmacy Med Name: AMLODIPINE-BENAZEPRIL 5-10 MG CAP] is not on the preferred formulary for the patient's insurance plan. Below are alternatives which are likely to be more affordable. Do not assume that every medication presented is a clinically appropriate alternative.  These alternatives are medications that are in the same pharmaceutical subclass (Antihypertensive Combinations) as the ordered medication and are on formulary for the patient's insurance plan.    Requested Prescriptions  Pending Prescriptions Disp Refills   amLODipine-benazepril (LOTREL) 5-10 MG capsule [Pharmacy Med Name: AMLODIPINE-BENAZEPRIL 5-10 MG CAP] 90 capsule 3    Sig: TAKE 1 CAPSULE EVERY DAY      Cardiovascular: CCB + ACEI Combos Passed - 12/25/2019  4:12 PM      Passed - Cr in normal range and within 180 days    Creatinine  Date Value Ref Range Status  12/15/2013 1.44 (H) 0.60 - 1.30 mg/dL Final   Creatinine, Ser  Date Value Ref Range Status  09/12/2019 1.21 0.76 - 1.27 mg/dL Final          Passed - K in normal range and within 180 days    Potassium  Date Value Ref Range Status  09/12/2019 4.4 3.5 - 5.2 mmol/L Final  12/15/2013 3.5 3.5 - 5.1 mmol/L Final          Passed - Patient is not pregnant      Passed - Last BP in normal range    BP Readings from Last 1 Encounters:  09/10/19 135/71          Passed - Valid encounter within last 6 months    Recent Outpatient Visits           3 months ago Encounter for annual physical exam   [facility], Dionne Bucy, MD   1 year ago Lumbar radiculopathy   [facility] [place], [place]    1 year ago Encounter for annual physical exam   [facility], Dionne Bucy, MD   1 year ago Need for shingles vaccine   [facility], Dionne Bucy, MD   1 year ago Essential hypertension   [place], Dionne Bucy, MD       Future Appointments             Today Ralene Bathe, MD [place]   In 2 months Bacigalupo, Dionne Bucy, MD [facility], [place]

## 2019-12-26 NOTE — Telephone Encounter (Signed)
Can you please review, I tried approving medication because patient has been seen within the past 3 months, labs up to date and has follow up scheduled. When I hit button to approve it says this drug is not on formulary? Please review. KW

## 2019-12-26 NOTE — Patient Instructions (Signed)

## 2019-12-31 ENCOUNTER — Telehealth: Payer: Self-pay

## 2019-12-31 NOTE — Telephone Encounter (Signed)
Left message on voicemail please call here

## 2020-01-10 NOTE — Telephone Encounter (Signed)
Advised pt of bx results/sh ?

## 2020-02-13 ENCOUNTER — Other Ambulatory Visit: Payer: Self-pay | Admitting: Family Medicine

## 2020-03-03 ENCOUNTER — Encounter: Payer: Self-pay | Admitting: Family Medicine

## 2020-03-03 ENCOUNTER — Other Ambulatory Visit: Payer: Self-pay

## 2020-03-03 ENCOUNTER — Ambulatory Visit: Payer: Medicare Other | Admitting: Family Medicine

## 2020-03-03 VITALS — BP 122/70 | HR 73 | Temp 96.9°F | Wt 202.0 lb

## 2020-03-03 DIAGNOSIS — I10 Essential (primary) hypertension: Secondary | ICD-10-CM

## 2020-03-03 DIAGNOSIS — E663 Overweight: Secondary | ICD-10-CM | POA: Insufficient documentation

## 2020-03-03 DIAGNOSIS — E039 Hypothyroidism, unspecified: Secondary | ICD-10-CM

## 2020-03-03 DIAGNOSIS — E785 Hyperlipidemia, unspecified: Secondary | ICD-10-CM | POA: Diagnosis not present

## 2020-03-03 MED ORDER — ROSUVASTATIN CALCIUM 5 MG PO TABS: 5.0000 mg | ORAL_TABLET | Freq: Every day | ORAL | 3 refills | Status: DC

## 2020-03-03 MED ORDER — TADALAFIL 5 MG PO TABS: 5.0000 mg | ORAL_TABLET | Freq: Every day | ORAL | 3 refills | Status: DC

## 2020-03-03 NOTE — Assessment & Plan Note (Signed)
Reviewed last lipid panel Well controlled Continue crestor at current dose Recheck FLP and CMP at next visit

## 2020-03-03 NOTE — Assessment & Plan Note (Signed)
Discussed importance of healthy weight management Discussed diet and exercise  

## 2020-03-03 NOTE — Assessment & Plan Note (Signed)
Previously well controlled Continue Synthroid at current dose  Recheck TSH and adjust Synthroid as indicated   

## 2020-03-03 NOTE — Assessment & Plan Note (Signed)
Well controlled Continue current medications Recheck metabolic panel F/u in 6 months  

## 2020-03-03 NOTE — Progress Notes (Signed)
Established patient visit   Patient: Gregory Mckay   DOB: 1945-12-03   74 y.o. Male  MRN: [id number] Visit Date: 03/03/2020  Today's healthcare provider: Lavon Paganini, MD   Chief Complaint  Patient presents with  . Hypertension  . Hyperlipidemia  . Hypothyroidism   Subjective    HPI   Hypertension, follow-up  BP Readings from Last 3 Encounters:  03/03/20 122/70  09/10/19 135/71  11/29/18 (!) 142/90   Wt Readings from Last 3 Encounters:  03/03/20 202 lb (91.6 kg)  09/10/19 201 lb (91.2 kg)  11/29/18 197 lb (89.4 kg)     He was last seen for hypertension 6 months ago.  BP at that visit was 135/71. Management since that visit includes no changes.  He reports excellent compliance with treatment. He is not having side effects.  He is following a Low Sodium diet. He is exercising. He does not smoke.  Use of agents associated with hypertension: none.   Outside blood pressures are not being checked. Symptoms: No chest pain No chest pressure  No palpitations No syncope  No dyspnea No orthopnea  No paroxysmal nocturnal dyspnea No lower extremity edema   Pertinent labs: Lab Results  Component Value Date   CHOL 125 09/12/2019   HDL 28 (L) 09/12/2019   LDLCALC 72 09/12/2019   TRIG 142 09/12/2019   CHOLHDL 4.5 09/12/2019   Lab Results  Component Value Date   NA 141 09/12/2019   K 4.4 09/12/2019   CREATININE 1.21 09/12/2019   GFRNONAA 59 (L) 09/12/2019   GFRAA 69 09/12/2019   GLUCOSE 101 (H) 09/12/2019     The ASCVD Risk score (Goff DC Jr., et al., 2013) failed to calculate for the following reasons:   The valid total cholesterol range is 130 to 320 mg/dL   --------------------------------------------------------------------------------------------------- Lipid/Cholesterol, Follow-up  Last lipid panel Other pertinent labs  Lab Results  Component Value Date   CHOL 125 09/12/2019   HDL 28 (L) 09/12/2019   LDLCALC 72 09/12/2019   TRIG 142  09/12/2019   CHOLHDL 4.5 09/12/2019   Lab Results  Component Value Date   ALT 17 09/12/2019   AST 16 09/12/2019   PLT 239 08/01/2018   TSH 2.260 09/12/2019     He was last seen for this 6 months ago.  Management since that visit includes no changes.  He reports excellent compliance with treatment. He is not having side effects.   Symptoms: No chest pain No chest pressure/discomfort  No dyspnea No lower extremity edema  No numbness or tingling of extremity No orthopnea  No palpitations No paroxysmal nocturnal dyspnea  No speech difficulty No syncope   Current diet: in general, a "healthy" diet   Current exercise: exercising three times a week.  The ASCVD Risk score Mikey Bussing DC Jr., et al., 2013) failed to calculate for the following reasons:   The valid total cholesterol range is 130 to 320 mg/dL  --------------------------------------------------------------------------------------------------- Hypothyroid, follow-up  Lab Results  Component Value Date   TSH 2.260 09/12/2019   TSH 2.300 09/05/2018   TSH 0.555 12/15/2013   Wt Readings from Last 3 Encounters:  03/03/20 202 lb (91.6 kg)  09/10/19 201 lb (91.2 kg)  11/29/18 197 lb (89.4 kg)    He was last seen for hypothyroid 6 months ago.  Management since that visit includes no changes. He reports excellent compliance with treatment. He is not having side effects.   Symptoms: No change in energy level No constipation  No diarrhea No heat / cold intolerance  No nervousness No palpitations  No weight changes    -----------------------------------------------------------------------------------------   Patient Active Problem List   Diagnosis Date Noted  . Hyperlipidemia 09/06/2018  . 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 Hypertension 02/27/2018  . Elevated PSA 02/27/2018  . Hypothyroidism 02/27/2018   Past Medical History:  Diagnosis Date  . Cataract   . Hypertension   . Thyroid disease    Social History     Tobacco Use  . Smoking status: Never Smoker  . Smokeless tobacco: Never Used  Substance Use Topics  . Alcohol use: No  . Drug use: No   No Known Allergies   Medications: Outpatient Medications Prior to Visit  Medication Sig  . amLODipine-benazepril (LOTREL) 5-10 MG capsule TAKE 1 CAPSULE EVERY DAY  . ARMOUR THYROID 60 MG tablet TAKE ONE TABLET EVERY DAY BEFORE BREAKFAST  . Acetylcarnitine HCl (ACETYL L-CARNITINE PO) Take 2 g by mouth 2 (two) times daily. Taking at 4:00 am and 4:00 pm  . Acetylcysteine (N-ACETYL-L-CYSTEINE) 600 MG CAPS Take 1 capsule by mouth daily. Taking at 1:00 am  . Alpha-Lipoic Acid 600 MG CAPS Take 1 capsule by mouth 2 (two) times daily. Taking at 4:00 am and 4:00 pm.  . Ashwagandha Extract 2.5 % POWD Take 470 mg by mouth 2 (two) times daily. Taking at 2:00 am and 2:00 pm.  . ASTAXANTHIN PO Take 12 mg by mouth daily. Taking at 1:00 ma  . beta carotene 25000 UNIT capsule Take 25,000 Units by mouth daily. Taking at 1:00 am  . Beta Glucan POWD Take 200 mg by mouth 2 (two) times daily. Taking at 1:00 am and 1:00 pm  . Biotin 5000 MCG CAPS Take 1 capsule by mouth daily. Taking at 1:00 am  . Carnosine POWD Take 500 mg by mouth 2 (two) times daily. Taking at 2:00 am and 1:00 pm.  . Cholecalciferol (VITAMIN D3) 10000 units TABS Take 1 tablet by mouth daily. Taking at 1:00 am  . Cinnamon 500 MG capsule Take 500 mg by mouth 2 (two) times daily. Taking at 1:00 am and 1:00 pm.  . CITICOLINE SODIUM PO Take 250 mg by mouth 2 (two) times daily. Taking at 1:00 am and 1:00 pm  . CITRULLINE PO Take by mouth daily. Taking at 3:00 pm.  . Coenzyme Q10 (COQ-10) 50 MG CAPS Take 50 mg by mouth daily. Taking at 1:00 am.  . cyclobenzaprine (FLEXERIL) 5 MG tablet Take 1 tablet (5 mg total) by mouth at bedtime. (Patient not taking: Reported on 09/09/2019)  . DHEA 50 MG TABS Take 1 tablet by mouth daily. Taking at 1:00 am.  . Flaxseed, Linseed, (FLAX SEED OIL PO) Take 1,250 mg by mouth 2  (two) times daily. Flax Seed Plus Omega 3. Taking at 1:00 am and 1:00 pm.  . gabapentin (NEURONTIN) 300 MG capsule Take 1 capsule (300 mg total) by mouth 3 (three) times daily.  Marland Kitchen GARLIC PO Take 466 mg by mouth 2 (two) times daily. Aged Garlic Extract. Taking at 2:00 am and 2:00 pm  . Ginkgo Biloba 40 MG TABS Take 120 mg by mouth 2 (two) times daily. Taking at 1:00 am and 1:00 pm.  . Glucosamine Sulfate 1000 MG TABS Take 1 tablet by mouth 2 (two) times daily. 1 tablet at 1:00 am, and 1 tablet at 1:00 pm  . Glutamine 500 MG CAPS Take 1 capsule by mouth 2 (two) times daily. Taking at 1:00 am and 1:00 pm  .  Glycerylphosphorylcholine (ALPHA-GPC 50%) POWD Take 300 mg by mouth 2 (two) times daily. Taking at 1:00 am and 1:00 pm  . Nyoka Cowden Tea, Camellia sinensis, (GREEN TEA EXTRACT PO) Take 750 mg by mouth daily. Taking at 1:00 am  . Gymnema Sylvestris Leaf POWD Take 400 mg by mouth 2 (two) times daily. Taking at 1:00 am and 1:00 pm  . Levomefolate Glucosamine (METHYLFOLATE PO) Take 1,000 mcg by mouth daily. Taking at 1:00 am.  . Lycopene 15 MG CAPS Take 1 capsule by mouth daily. Taking at 1:00 am.  . Menaquinone-7 (VITAMIN K2 PO) Take 2,700 mcg by mouth daily. Advanced K2 Complex. Taking at 1:00 am.  . milk thistle 175 MG tablet Take 175 mg by mouth 2 (two) times daily. Taking at 1:00 am and 1:00 pm  . Misc Natural Products (BETA-SITOSTEROL PLANT STEROLS) CAPS Take 60 mg by mouth 2 (two) times daily. Taking at 3:00 and 3:00 pm  . Misc Natural Products (PROSTATE HEALTH PO) Take 1 tablet by mouth 2 (two) times daily. Taking at 1:00 am and 1:00 pm  . Nutritional Supplements (PYCNOGENOL PO) Take 100 mg by mouth 2 (two) times daily. Taking at 1:00 am and 1:00 pm.  . OVER THE COUNTER MEDICATION Take 750 mg by mouth 2 (two) times daily. Curamed. Taking 1 tablet at 1:00 am, and 1 tablet at 1:00 pm.  . OVER THE COUNTER MEDICATION Take 300 mg by mouth 2 (two) times daily. Ellagic Active. Taking at 1:00 am and 1:00 pm.   . OVER THE COUNTER MEDICATION 2 g 2 (two) times daily. propionyl-l-carnitine. Taking at 4:00 am and 4:00 pm.  . OVER THE COUNTER MEDICATION Take 30 mg by mouth daily. Optizinc. Taking at 1:00 am  . OVER THE COUNTER MEDICATION Take 250,000 Units by mouth daily. Taking at 6:00 am  . Pantothenic Acid 500 MG TABS Take 1 tablet by mouth daily. Taking at 1:00 am  . PHOSPHATIDYLSERINE PO Take 300 mg by mouth daily. Taking at 1:00 am  . pyridoxine (B-6) 100 MG tablet Take 100 mg by mouth daily. Taking at 1:00 am.  . QUERCETIN PO Take by mouth 2 (two) times daily. Taking at 2:00 am and 2:00 pm.  . RESVERATROL PO Take 500 mg by mouth daily. Taking at 1:00 am.  . rosuvastatin (CRESTOR) 5 MG tablet Take 1 tablet (5 mg total) by mouth daily.  . tadalafil (CIALIS) 5 MG tablet TAKE ONE TABLET EVERY DAY  . triamcinolone ointment (KENALOG) 0.5 % Apply 1 application topically 2 (two) times daily.  . Tyrosine 500 MG TABS Take 1 tablet by mouth daily. Taking at 2:00 am  . vitamin E 400 UNIT capsule Take 400 Units by mouth daily. Taking at 1:00 am  . Vitamin Mixture (ESTER-C PO) Take 1,000 mg by mouth daily. Taking at 1:00 am   No facility-administered medications prior to visit.    Review of Systems  Constitutional: Negative.   Respiratory: Negative.   Cardiovascular: Negative.   Gastrointestinal: Negative.   Neurological: Negative for dizziness, light-headedness and headaches.    Last CBC Lab Results  Component Value Date   WBC 10.8 (H) 08/01/2018   HGB 15.2 08/01/2018   HCT 49.7 08/01/2018   MCV 82.0 08/01/2018   MCH 25.1 (L) 08/01/2018   RDW 14.5 08/01/2018   PLT 239 96/75/9163   Last metabolic panel Lab Results  Component Value Date   GLUCOSE 101 (H) 09/12/2019   NA 141 09/12/2019   K 4.4 09/12/2019   CL  104 09/12/2019   CO2 23 09/12/2019   BUN 17 09/12/2019   CREATININE 1.21 09/12/2019   GFRNONAA 59 (L) 09/12/2019   GFRAA 69 09/12/2019   CALCIUM 10.1 09/12/2019   PROT 6.8  09/12/2019   ALBUMIN 4.5 09/12/2019   LABGLOB 2.3 09/12/2019   AGRATIO 2.0 09/12/2019   BILITOT 0.4 09/12/2019   ALKPHOS 45 09/12/2019   AST 16 09/12/2019   ALT 17 09/12/2019   ANIONGAP 8 08/01/2018   Last lipids Lab Results  Component Value Date   CHOL 125 09/12/2019   HDL 28 (L) 09/12/2019   LDLCALC 72 09/12/2019   TRIG 142 09/12/2019   CHOLHDL 4.5 09/12/2019   Last thyroid functions Lab Results  Component Value Date   TSH 2.260 09/12/2019    Objective    BP 122/70 (BP Location: Left Arm, Patient Position: Sitting, Cuff Size: Large)   Pulse 73   Temp (!) 96.9 F (36.1 C) (Temporal)   Wt 202 lb (91.6 kg)   SpO2 99%   BMI 28.17 kg/m  BP Readings from Last 3 Encounters:  03/03/20 122/70  09/10/19 135/71  11/29/18 (!) 142/90      Physical Exam Vitals reviewed.  Constitutional:      General: He is not in acute distress.    Appearance: Normal appearance. He is not diaphoretic.  HENT:     Head: Normocephalic and atraumatic.  Eyes:     General: No scleral icterus.    Conjunctiva/sclera: Conjunctivae normal.  Cardiovascular:     Rate and Rhythm: Normal rate and regular rhythm.     Pulses: Normal pulses.     Heart sounds: Normal heart sounds. No murmur.  Pulmonary:     Effort: Pulmonary effort is normal. No respiratory distress.     Breath sounds: Normal breath sounds. No wheezing or rhonchi.  Abdominal:     General: There is no distension.     Palpations: Abdomen is soft.     Tenderness: There is no abdominal tenderness.  Musculoskeletal:     Cervical back: Neck supple.     Right lower leg: No edema.     Left lower leg: No edema.  Lymphadenopathy:     Cervical: No cervical adenopathy.  Skin:    General: Skin is warm and dry.     Capillary Refill: Capillary refill takes less than 2 seconds.     Findings: No rash.  Neurological:     Mental Status: He is alert and oriented to person, place, and time.     Cranial Nerves: No cranial nerve deficit.    Psychiatric:        Mood and Affect: Mood normal.        Behavior: Behavior normal.      No results found for any visits on 03/03/20.  Assessment & Plan     Problem List Items Addressed This Visit      Cardiovascular and Mediastinum   Hypertension - Primary    Well controlled Continue current medications Recheck metabolic panel F/u in 6 months       Relevant Medications   tadalafil (CIALIS) 5 MG tablet   rosuvastatin (CRESTOR) 5 MG tablet   Other Relevant Orders   Basic Metabolic Panel (BMET)     Endocrine   Hypothyroidism    Previously well controlled Continue Synthroid at current dose  Recheck TSH and adjust Synthroid as indicated        Relevant Orders   TSH     Other  Hyperlipidemia    Reviewed last lipid panel Well controlled Continue crestor at current dose Recheck FLP and CMP at next visit      Relevant Medications   tadalafil (CIALIS) 5 MG tablet   rosuvastatin (CRESTOR) 5 MG tablet   Overweight    Discussed importance of healthy weight management Discussed diet and exercise           Return in about 6 months (around 09/02/2020) for AWV, CPE.      I, Lavon Paganini, MD, have reviewed all documentation for this visit. The documentation on 03/03/20 for the exam, diagnosis, procedures, and orders are all accurate and complete.   Tyana Butzer, Dionne Bucy, MD, MPH [facility]

## 2020-03-04 ENCOUNTER — Telehealth: Payer: Self-pay

## 2020-03-04 LAB — BASIC METABOLIC PANEL
BUN/Creatinine Ratio: 17 (ref 10–24)
BUN: 23 mg/dL (ref 8–27)
CO2: 24 mmol/L (ref 20–29)
Calcium: 9.7 mg/dL (ref 8.6–10.2)
Chloride: 102 mmol/L (ref 96–106)
Creatinine, Ser: 1.34 mg/dL — ABNORMAL HIGH (ref 0.76–1.27)
GFR calc Af Amer: 60 mL/min/{1.73_m2} (ref >59)
GFR calc non Af Amer: 52 mL/min/{1.73_m2} — ABNORMAL LOW (ref >59)
Glucose: 87 mg/dL (ref 65–99)
Potassium: 4.4 mmol/L (ref 3.5–5.2)
Sodium: 138 mmol/L (ref 134–144)

## 2020-03-04 LAB — TSH: TSH: 2.2 u[IU]/mL (ref 0.450–4.500)

## 2020-03-04 NOTE — Telephone Encounter (Signed)
LMTCB 03/04/2020.  PEC please advise pt of results below.    Thanks,   -Mickel Baas

## 2020-03-04 NOTE — Telephone Encounter (Signed)
-----   Message from Virginia Crews, MD sent at 03/04/2020  9:32 AM EDT ----- Normal/stable labs

## 2020-03-04 NOTE — Telephone Encounter (Signed)
Patient called and advised of labs noted below by Dr. Brita Romp, he verbalized understanding.

## 2020-03-10 ENCOUNTER — Ambulatory Visit: Payer: Self-pay | Admitting: Family Medicine

## 2020-03-23 ENCOUNTER — Encounter: Payer: Medicare Other | Admitting: Dermatology

## 2020-06-03 IMAGING — MR MR PROSTATE WO/W CM
23 of 54 series · 23 of 54 positions shown · IV contrast (yes)
Comparison: CT 02/05/2015

CLINICAL DATA: Elevated PSA.  No biopsy.

EXAM:
MR PROSTATE WITHOUT AND WITH CONTRAST
TECHNIQUE: Multiplanar multisequence MRI images were obtained of the pelvis
centered about the prostate. Pre and post contrast images were
obtained.
CONTRAST:  19mL MULTIHANCE GADOBENATE DIMEGLUMINE 529 MG/ML IV SOLN

[Series 3: bSSFP fat-sat · axial · 6.0mm · 0.86mm/px · 1 of 44 slices shown]
[im 1/44]
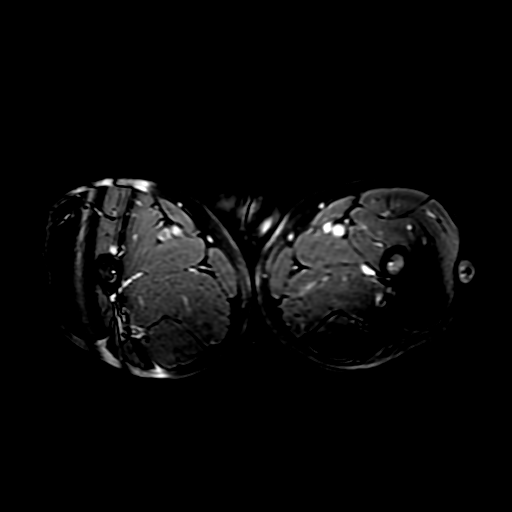

[Series 4: T1 · axial · 6.0mm · 0.86mm/px · 1 of 43 slices shown (1 of 2)]
[im 1/43]
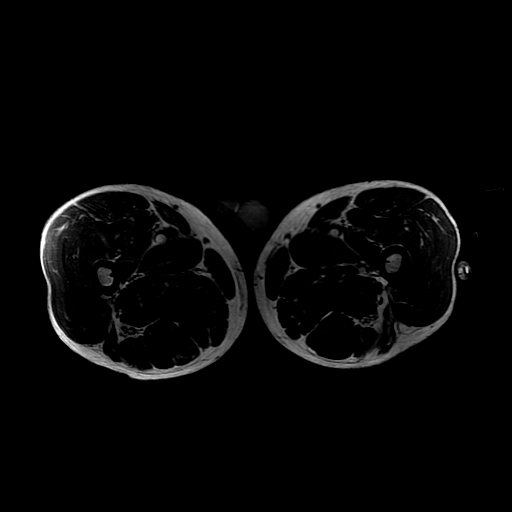

[Series 5: T2 · axial · 3.0mm · 0.29mm/px · 1 of 26 slices shown (1 of 4)]
[im 1/26]
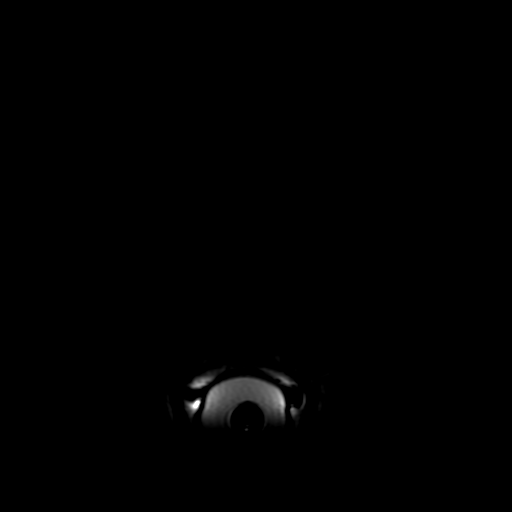

[Series 6: T1 · axial · 3.0mm · 0.29mm/px · 1 of 26 slices shown (2 of 2)]
[im 1/26]
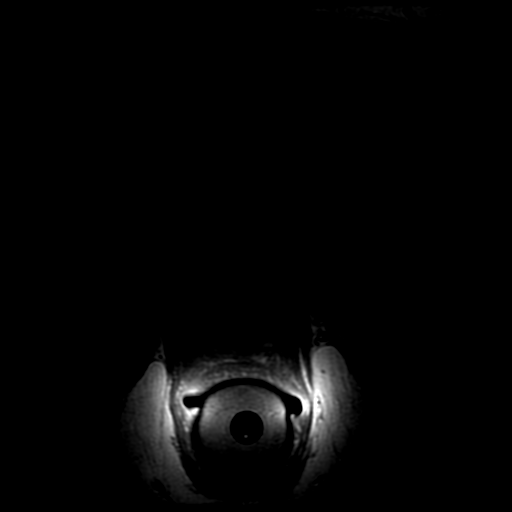

[Series 7: T2 · axial · 1.8mm · 0.47mm/px · 1 of 156 slices shown (2 of 4)]
[im 1/156]
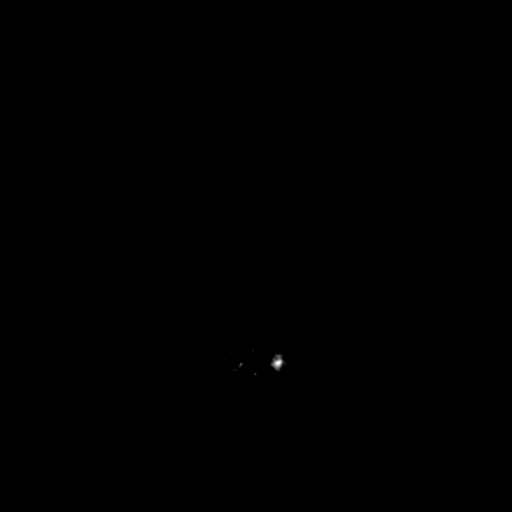

[Series 8: T2 · sagittal · 4.0mm · 0.29mm/px · 1 of 24 slices shown (3 of 4)]
[im 1/24]
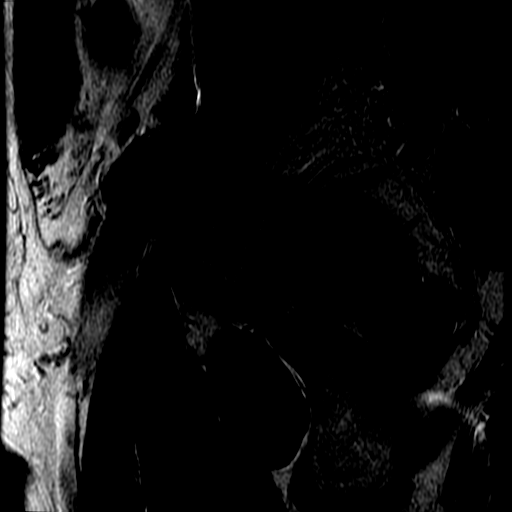

[Series 9: T2 · coronal · 4.0mm · 0.29mm/px · 1 of 22 slices shown (4 of 4)]
[im 1/22]
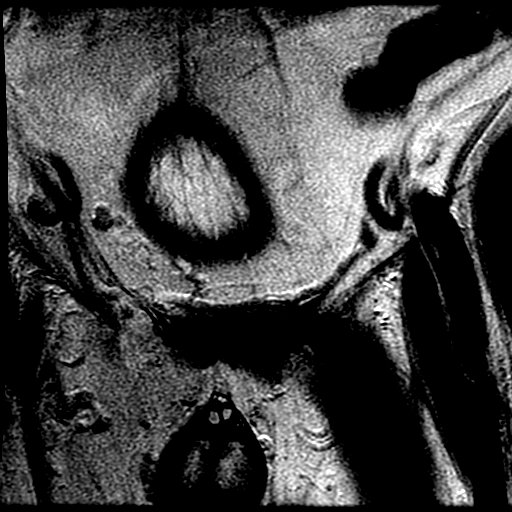

[Series 10: DWI · axial · 3.0mm · 0.59mm/px · 1 of 50 slices shown (1 of 6)]
[im 1/50]
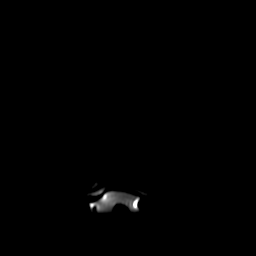

[Series 11: DWI · axial · 3.0mm · 0.59mm/px · 1 of 49 slices shown (2 of 6)]
[im 1/49]
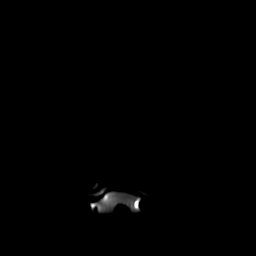

[Series 12: DWI · axial · 3.0mm · 0.59mm/px · 1 of 49 slices shown (3 of 6)]
[im 1/49]
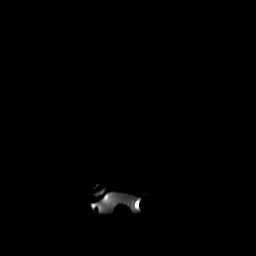

[Series 1000: DWI · axial · 3.0mm · 0.59mm/px · 1 of 26 slices shown (4 of 6)]
[im 1/26]
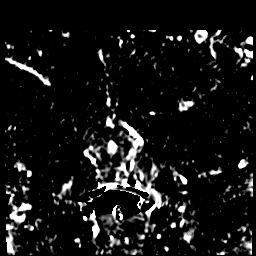

[Series 1100: DWI · axial · 3.0mm · 0.59mm/px · 1 of 26 slices shown (5 of 6)]
[im 1/26]
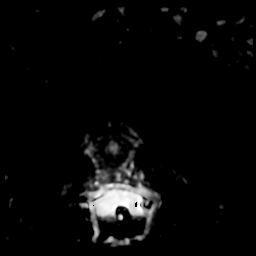

[Series 1200: DWI · axial · 3.0mm · 0.59mm/px · 1 of 26 slices shown (6 of 6)]
[im 1/26]
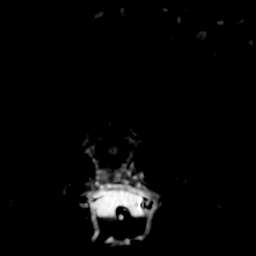

[((id)/(id)/1)-((id)/(id)/1) · axial · 3.0mm · 0.43mm/px · 1 of 72 slices shown (1 of 10)]
[im 1/72]
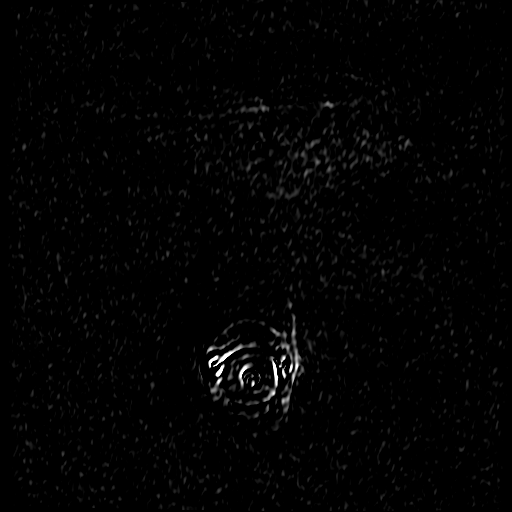

[((id)/(id)/1)-((id)/(id)/1) · axial · 3.0mm · 0.43mm/px · 1 of 71 slices shown (2 of 10)]
[im 1/71]
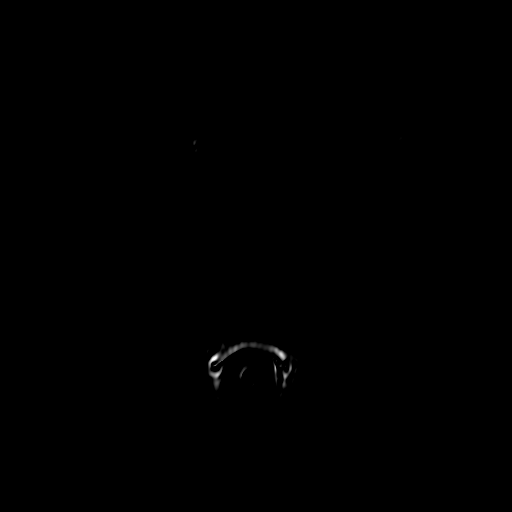

[((id)/(id)/1)-((id)/(id)/1) · axial · 3.0mm · 0.43mm/px · 1 of 71 slices shown (3 of 10)]
[im 1/71]
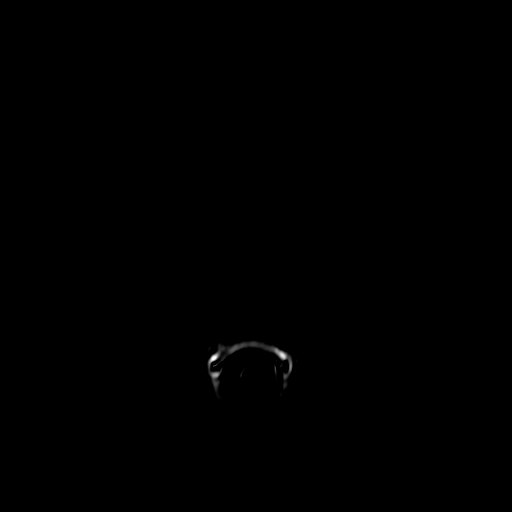

[((id)/(id)/1)-((id)/(id)/1) · axial · 3.0mm · 0.43mm/px · 1 of 72 slices shown (4 of 10)]
[im 1/72]
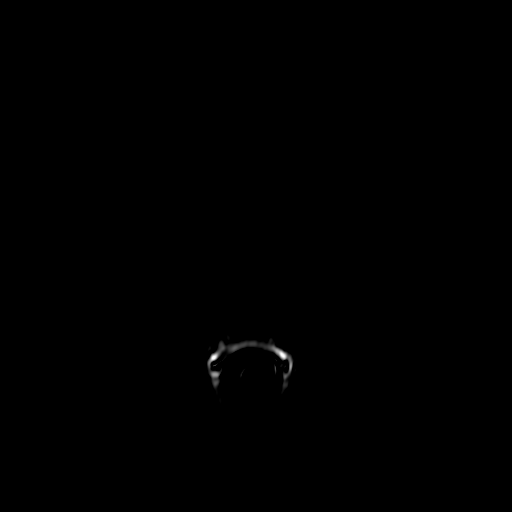

[((id)/(id)/1)-((id)/(id)/1) · axial · 3.0mm · 0.43mm/px · 1 of 72 slices shown (5 of 10)]
[im 1/72]
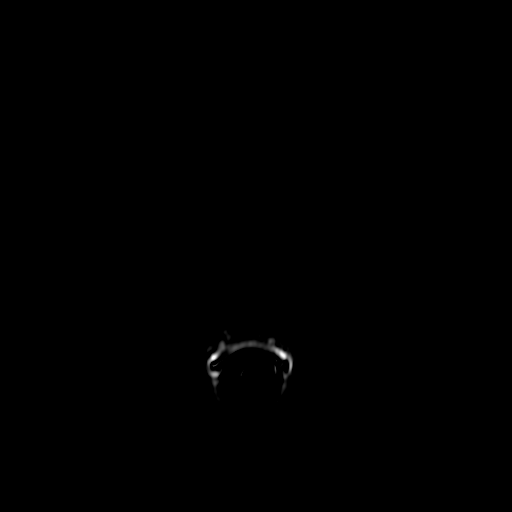

[((id)/(id)/1)-((id)/(id)/1) · axial · 3.0mm · 0.43mm/px · 1 of 71 slices shown (6 of 10)]
[im 1/71]
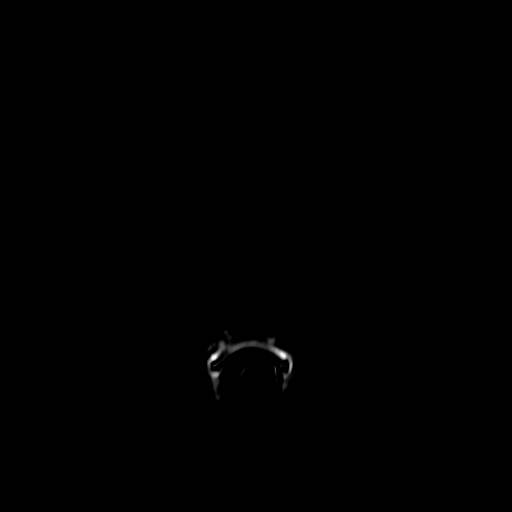

[((id)/(id)/1)-((id)/(id)/1) · axial · 3.0mm · 0.43mm/px · 1 of 72 slices shown (7 of 10)]
[im 1/72]
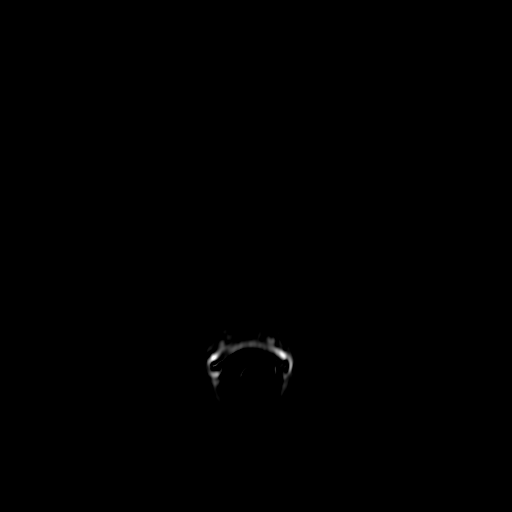

[((id)/(id)/1)-((id)/(id)/1) · axial · 3.0mm · 0.43mm/px · 1 of 70 slices shown (8 of 10)]
[im 1/70]
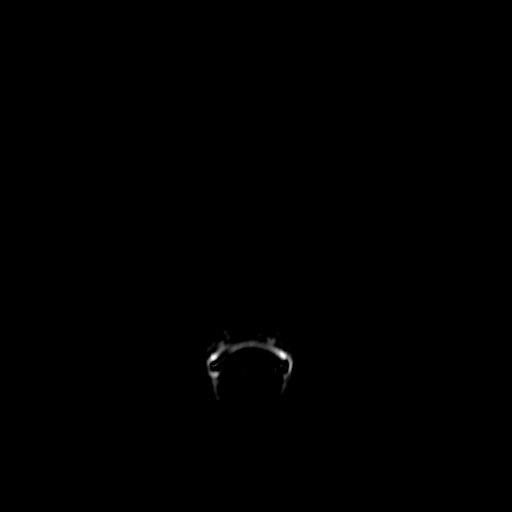

[((id)/(id)/1)-((id)/(id)/1) · axial · 3.0mm · 0.43mm/px · 1 of 72 slices shown (9 of 10)]
[im 1/72]
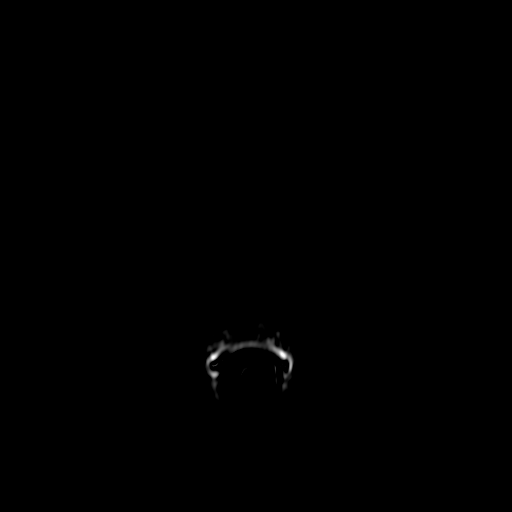

[((id)/(id)/1)-((id)/(id)/1) · axial · 3.0mm · 0.43mm/px · 1 of 72 slices shown (10 of 10)]
[im 1/72]
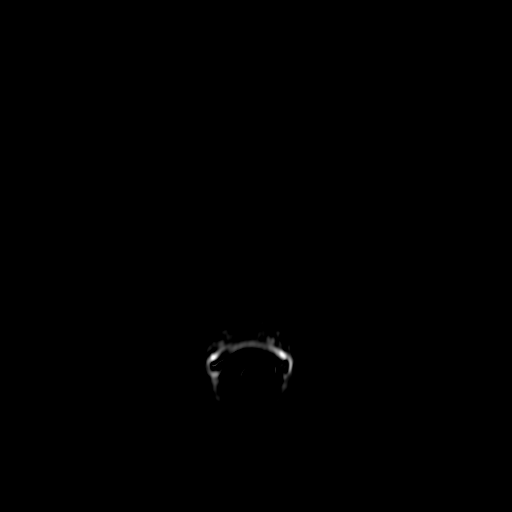

[23 of 54 positions shown; findings below may reference images not displayed]

FINDINGS: Prostate: Normal mild heterogeneous signal intensity within the
peripheral zone on T2 weighted imaging. No discrete lesion. No
evidence restricted diffusion within the transitional zone
peripheral zone.

The transitional zone is enlarged with nodular capsulated nodules.
No suspicious lesions.

Postcontrast imaging demonstrates no suspicious enhancement.

Volume: 5.7 x 4.1 x 6.2 cm (volume = 76 cm^3)

Transcapsular spread:  Absent

Seminal vesicle involvement: Absent

Neurovascular bundle involvement: Absent

Pelvic adenopathy: Absent

Bone metastasis: Absent

Other findings: None
IMPRESSION: 1. No high-grade carcinoma within the peripheral zone.
2. Nodular enlargement of transition zone most consistent with
benign prostate hypertrophy ([place] Tiger 1).

## 2020-06-22 ENCOUNTER — Ambulatory Visit: Payer: Medicare Other | Admitting: Dermatology

## 2020-06-22 ENCOUNTER — Other Ambulatory Visit: Payer: Self-pay

## 2020-06-22 ENCOUNTER — Encounter: Payer: Self-pay | Admitting: Dermatology

## 2020-06-22 DIAGNOSIS — L578 Other skin changes due to chronic exposure to nonionizing radiation: Secondary | ICD-10-CM | POA: Diagnosis not present

## 2020-06-22 DIAGNOSIS — L980 Pyogenic granuloma: Secondary | ICD-10-CM | POA: Diagnosis not present

## 2020-06-22 DIAGNOSIS — D485 Neoplasm of uncertain behavior of skin: Secondary | ICD-10-CM

## 2020-06-22 DIAGNOSIS — L821 Other seborrheic keratosis: Secondary | ICD-10-CM

## 2020-06-22 NOTE — Patient Instructions (Signed)

## 2020-06-22 NOTE — Progress Notes (Signed)
   Follow-Up Visit   Subjective  Gregory Mckay is a 74 y.o. male who presents for the following: Skin Problem. Patient here today for a spot at right side that has been bleeding. Patient advises it's been there for about 1 week.  The patient has history of skin cancer treated in the past as well as other spots.  He has other spots on the body to be checked today.  The following portions of the chart were reviewed this encounter and updated as appropriate:  Tobacco  Allergies  Meds  Problems  Med Hx  Surg Hx  Fam Hx     Review of Systems:  No other skin or systemic complaints except as noted in HPI or Assessment and Plan.  Objective  Well appearing patient in no apparent distress; mood and affect are within normal limits.  A focused examination was performed including trunk. Relevant physical exam findings are noted in the Assessment and Plan.  Objective  Right Side: Beefy red nodule 1.5cm   Assessment & Plan  Neoplasm of uncertain behavior of skin Right Side  Epidermal / dermal shaving  Lesion diameter (cm):  1.5 Informed consent: discussed and consent obtained   Timeout: patient name, date of birth, surgical site, and procedure verified   Procedure prep:  Patient was prepped and draped in usual sterile fashion Prep type:  Isopropyl alcohol Anesthesia: the lesion was anesthetized in a standard fashion   Anesthetic:  1% lidocaine w/ epinephrine 1-100,000 buffered w/ 8.4% NaHCO3 Instrument used: flexible razor blade   Hemostasis achieved with: pressure, aluminum chloride and electrodesiccation   Outcome: patient tolerated procedure well   Post-procedure details: sterile dressing applied and wound care instructions given   Dressing type: bandage and petrolatum    Destruction of lesion Complexity: extensive   Destruction method: electrodesiccation and curettage   Informed consent: discussed and consent obtained   Timeout:  patient name, date of birth, surgical site,  and procedure verified Procedure prep:  Patient was prepped and draped in usual sterile fashion Prep type:  Isopropyl alcohol Anesthesia: the lesion was anesthetized in a standard fashion   Anesthetic:  1% lidocaine w/ epinephrine 1-100,000 buffered w/ 8.4% NaHCO3 Curettage performed in three different directions: Yes   Electrodesiccation performed over the curetted area: Yes   Lesion length (cm):  1.5 Lesion width (cm):  1.5 Margin per side (cm):  0.2 Final wound size (cm):  1.9 Hemostasis achieved with:  pressure, aluminum chloride and electrodesiccation Outcome: patient tolerated procedure well with no complications   Post-procedure details: sterile dressing applied and wound care instructions given   Dressing type: bandage and petrolatum    Specimen 1 - Surgical pathology Differential Diagnosis: Pyogenic Granuloma vs Skin Cancer Check Margins: No Beefy red nodule 1.5cm  Actinic Damage - diffuse scaly erythematous macules with underlying dyspigmentation - Recommend daily broad spectrum sunscreen SPF 30+ to sun-exposed areas, reapply every 2 hours as needed.  - Call for new or changing lesions.  Seborrheic Keratoses - Stuck-on, waxy, tan-brown papules and plaques  - Discussed benign etiology and prognosis. - Observe - Call for any changes  Return for as scheduled.  Recommend the patient schedule for total-body skin exam within the next 6 months.  He has history of skin cancers.  Graciella Belton, RMA, am acting as scribe for Sarina Ser, MD .Documentation: I have reviewed the above documentation for accuracy and completeness, and I agree with the above.  Sarina Ser, MD

## 2020-06-29 ENCOUNTER — Telehealth: Payer: Self-pay

## 2020-06-29 NOTE — Telephone Encounter (Signed)
Left message for patient to call office for results/hd 

## 2020-06-29 NOTE — Telephone Encounter (Signed)
-----   Message from David C Kowalski, MD sent at 06/29/2020 12:06 PM EDT ----- Skin , right side PYOGENIC GRANULOMA, ULCERATED  Benign pyogenic granuloma May recur Keep follow up appt (if grows back - return ASAP) 

## 2020-07-07 DIAGNOSIS — H6060 Unspecified chronic otitis externa, unspecified ear: Secondary | ICD-10-CM | POA: Diagnosis not present

## 2020-07-07 DIAGNOSIS — H6123 Impacted cerumen, bilateral: Secondary | ICD-10-CM | POA: Diagnosis not present

## 2020-07-22 ENCOUNTER — Telehealth: Payer: Self-pay

## 2020-07-22 NOTE — Telephone Encounter (Signed)
Patient informed of pathology results 

## 2020-07-22 NOTE — Telephone Encounter (Signed)
-----   Message from Ralene Bathe, MD sent at 06/29/2020 12:06 PM EDT ----- Skin , right side PYOGENIC GRANULOMA, ULCERATED  Benign pyogenic granuloma May recur Keep follow up appt (if grows back - return ASAP)

## 2020-08-03 ENCOUNTER — Other Ambulatory Visit: Payer: Self-pay | Admitting: Family Medicine

## 2020-08-03 NOTE — Telephone Encounter (Signed)
Requested Prescriptions  Pending Prescriptions Disp Refills  . tadalafil (CIALIS) 5 MG tablet [Pharmacy Med Name: TADALAFIL 5 MG TAB] 90 tablet 3    Sig: TAKE ONE TABLET EVERY DAY     Urology: Erectile Dysfunction Agents Passed - 08/03/2020  1:28 PM      Passed - Last BP in normal range    BP Readings from Last 1 Encounters:  03/03/20 122/70         Passed - Valid encounter within last 12 months    Recent Outpatient Visits          5 months ago Essential hypertension   [facility] [place], Dionne Bucy, MD   10 months ago Encounter for annual physical exam   [facility] [place], Dionne Bucy, MD   1 year ago Lumbar radiculopathy   [facility] [place], [place]   1 year ago Encounter for annual physical exam   [facility] [place], Dionne Bucy, MD   2 years ago Need for shingles vaccine   [facility], Dionne Bucy, MD      Future Appointments            In 1 month [place], Dionne Bucy, MD [facility], [place]

## 2020-08-04 ENCOUNTER — Other Ambulatory Visit: Payer: Self-pay | Admitting: Family Medicine

## 2020-08-04 DIAGNOSIS — E039 Hypothyroidism, unspecified: Secondary | ICD-10-CM

## 2020-08-05 IMAGING — CR DG CHEST 2V
1 series · 2 of 2 positions shown · non-contrast
Comparison: PA and lateral chest 12/14/2013.

CLINICAL DATA: Onset chest pain today.

EXAM:
CHEST - 2 VIEW

[Series 1: w chest pa · 0.14mm/px · 2 of 2 slices shown]
[im 1/2]
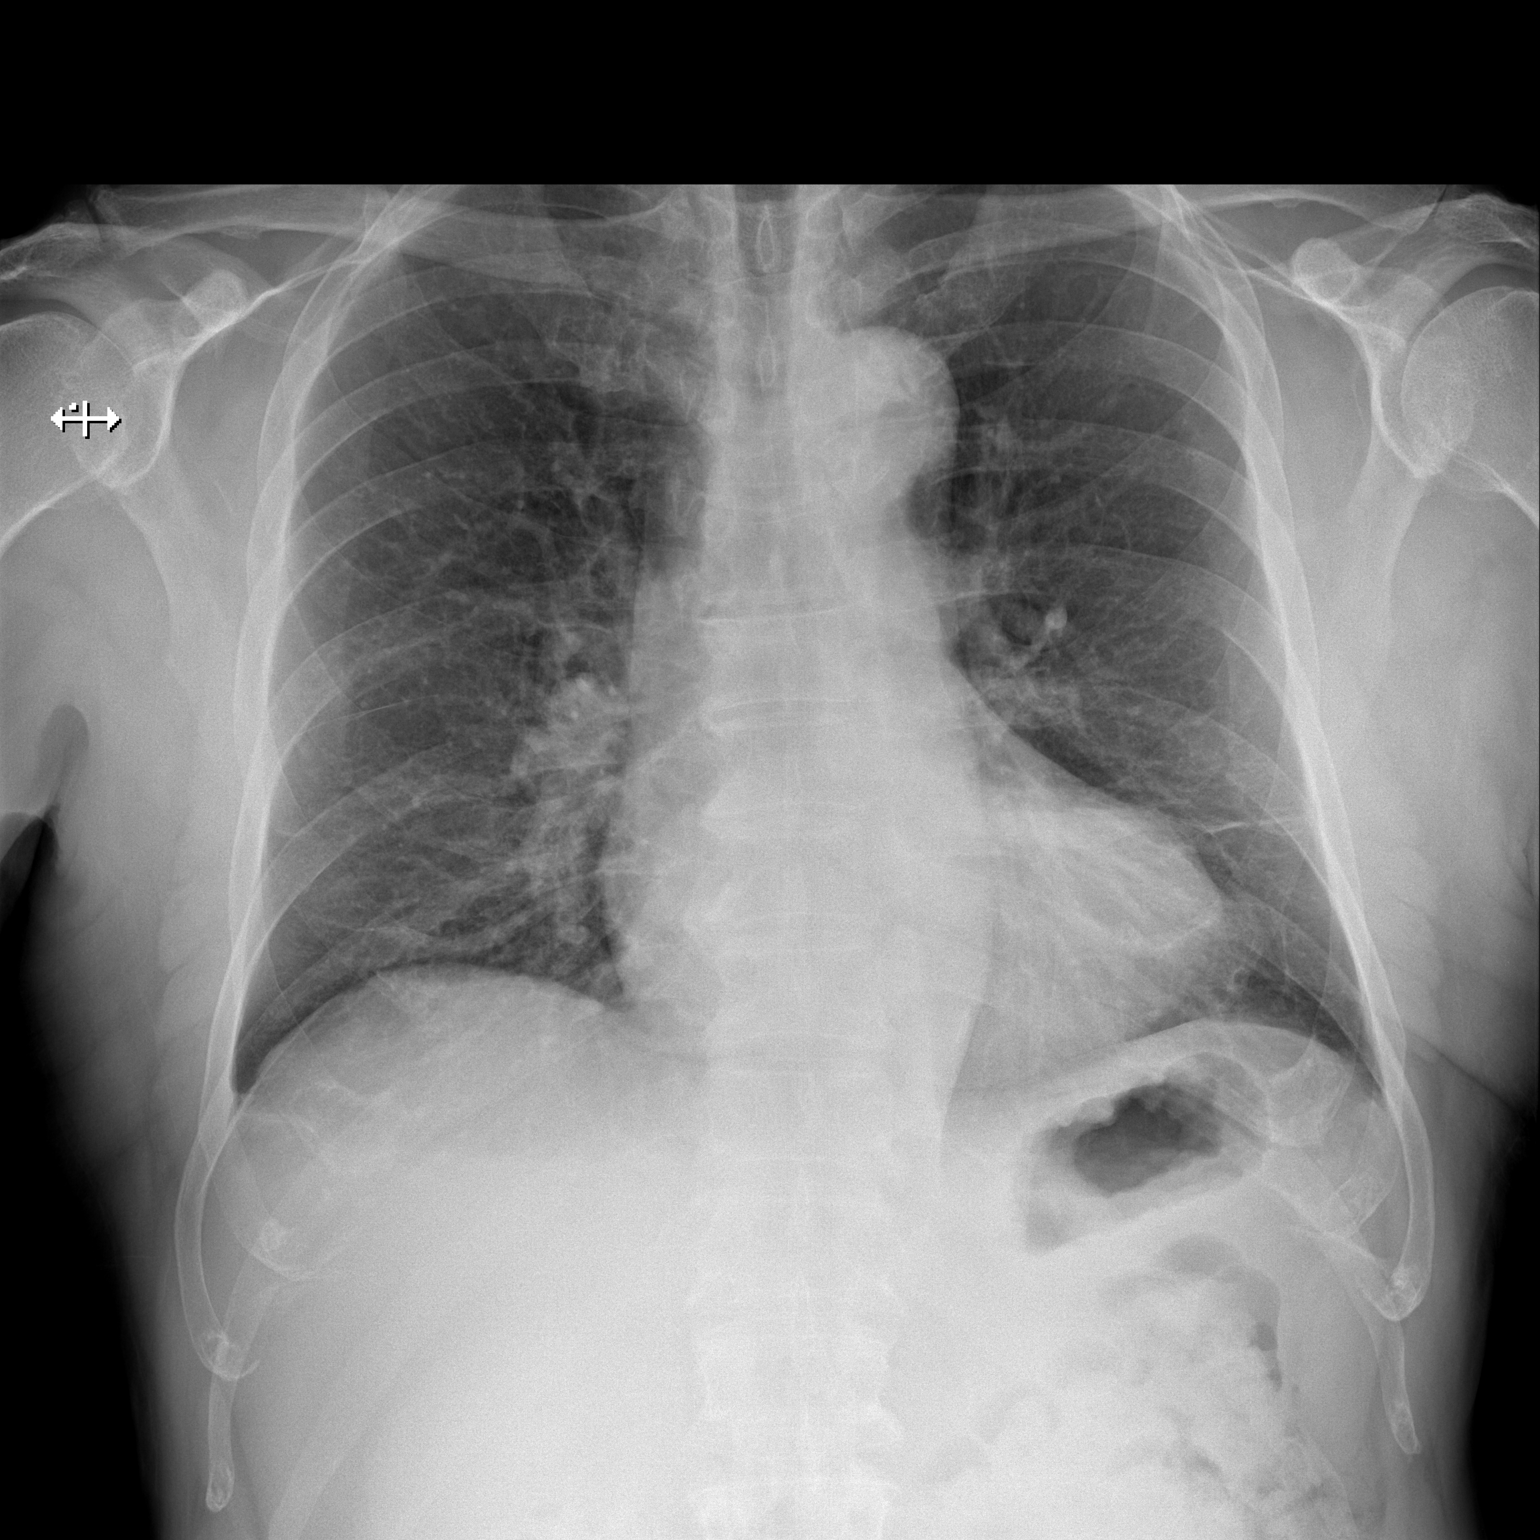
[im 2/2]
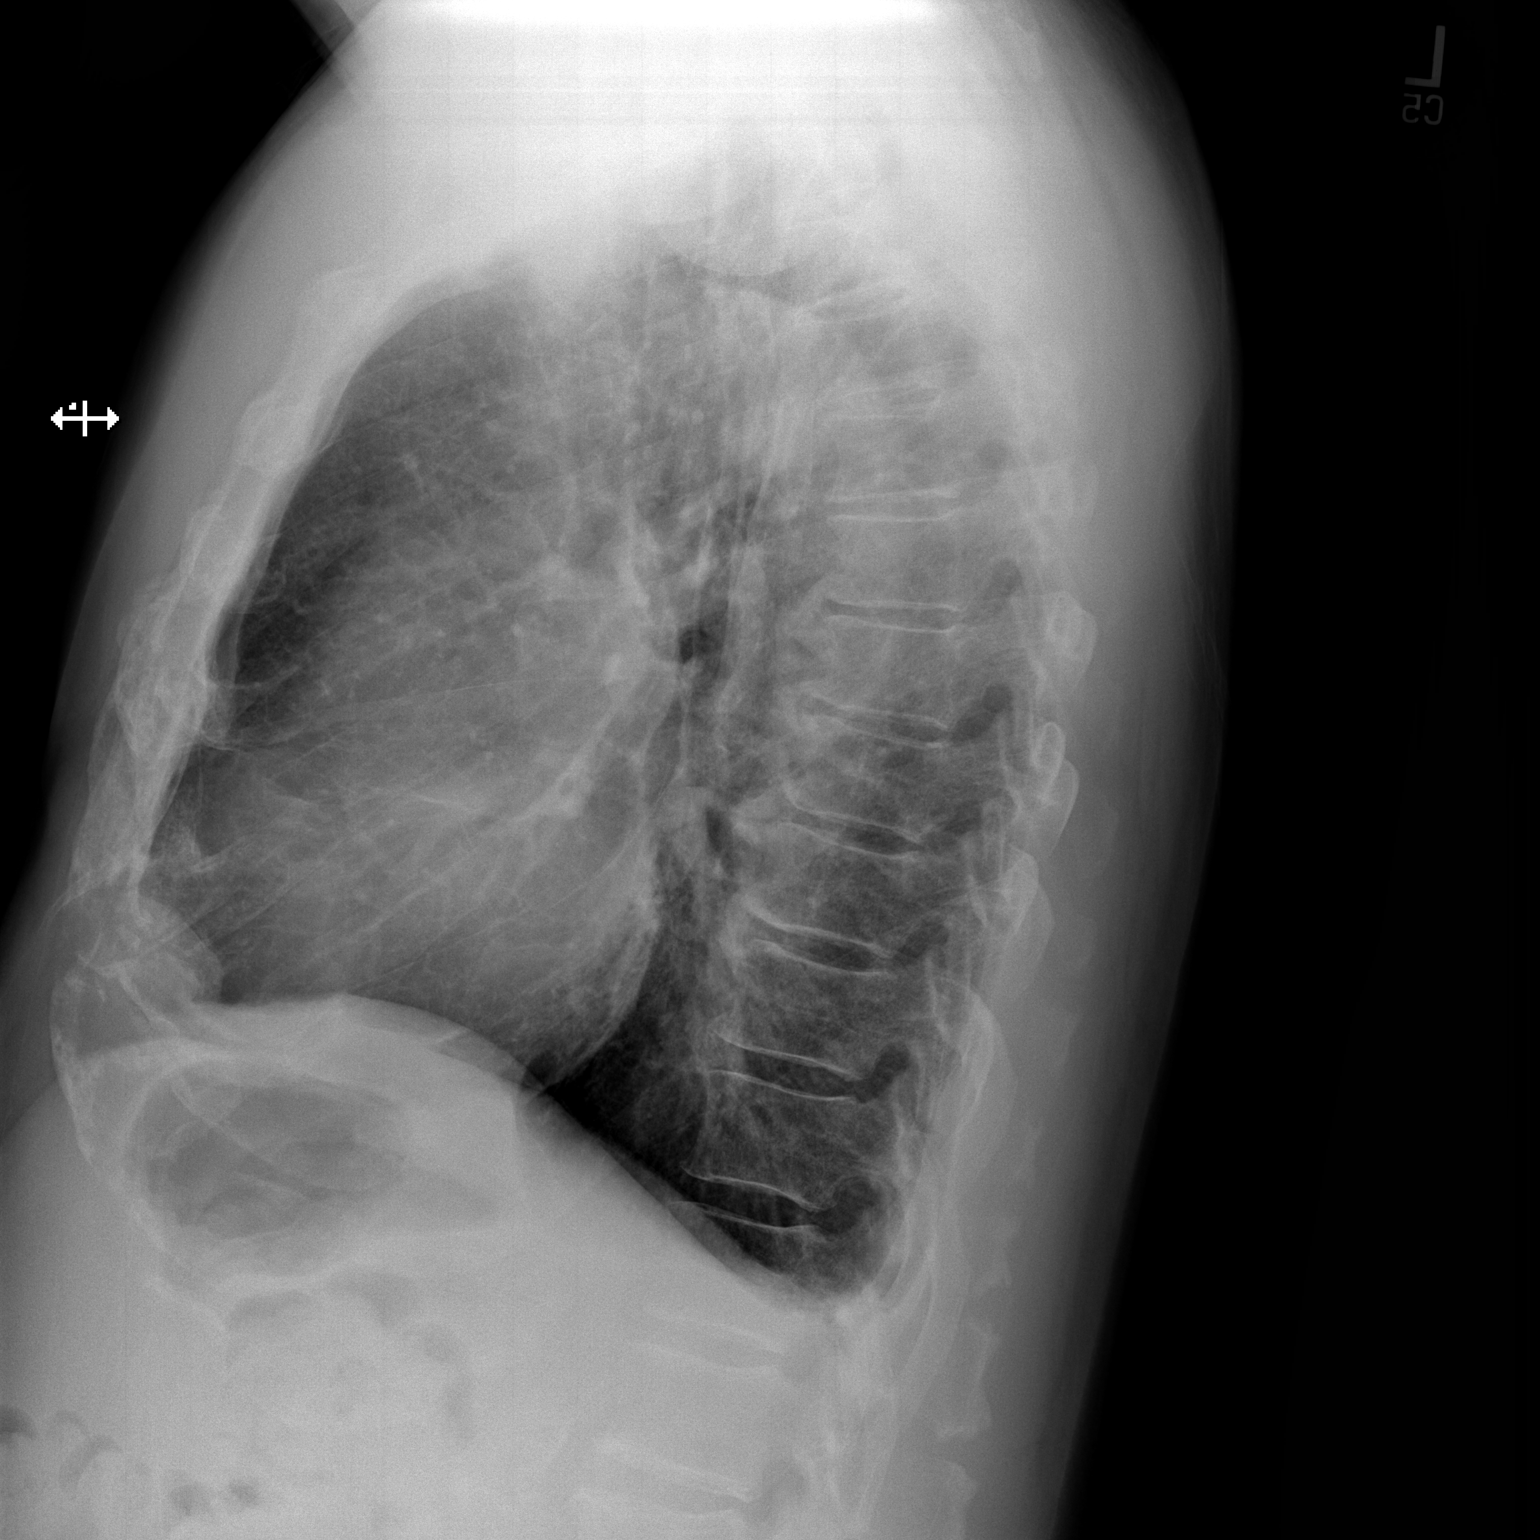

[2 of 2 positions shown; findings below may reference images not displayed]

FINDINGS: Small focus of linear scar in the lingula is unchanged. Lungs
otherwise clear. Heart size is upper normal. No pneumothorax or
pleural effusion. No acute or focal bony abnormality.
IMPRESSION: No acute disease.

## 2020-09-09 NOTE — Progress Notes (Signed)
Subjective:   Gregory Mckay is a 74 y.o. male who presents for Medicare Annual/Subsequent preventive examination.  I connected with Jarian Longoria today by telephone and verified that I am speaking with the correct person using two identifiers. Location patient: home Location provider: work Persons participating in the virtual visit: patient, provider.   I discussed the limitations, risks, security and privacy concerns of performing an evaluation and management service by telephone and the availability of in person appointments. I also discussed with the patient that there may be a patient responsible charge related to this service. The patient expressed understanding and verbally consented to this telephonic visit.    Interactive audio and video telecommunications were attempted between this provider and patient, however failed, due to patient having technical difficulties OR patient did not have access to video capability.  We continued and completed visit with audio only.   Review of Systems    N/A  Cardiac Risk Factors include: advanced age (>84men, >74 women);dyslipidemia;male gender;hypertension     Objective:    There were no vitals filed for this visit. There is no height or weight on file to calculate BMI.  Advanced Directives 09/10/2020 09/09/2019 08/29/2018 08/01/2018  Does Patient Have a Medical Advance Directive? No Yes No No  Type of Advance Directive - Healthcare Power of [place];Living will - -  Copy of [place] in Chart? - No - copy requested - -  Would patient like information on creating a medical advance directive? No - Patient declined - No - Patient declined -    Current Medications (verified) Outpatient Encounter Medications as of 09/10/2020  Medication Sig  . amLODipine-benazepril (LOTREL) 5-10 MG capsule TAKE 1 CAPSULE EVERY DAY  . ARMOUR THYROID 60 MG tablet TAKE ONE TABLET EVERY DAY BEFORE BREAKFAST  . Biotin 5000 MCG CAPS Take 1  capsule by mouth daily. Taking at 1:00 am  . Cholecalciferol (VITAMIN D3) 10000 units TABS Take 1 tablet by mouth daily. Taking at 1:00 am  . Coenzyme Q10 (COQ-10) 50 MG CAPS Take 50 mg by mouth daily. Taking at 1:00 am.  . GARLIC PO Take 397 mg by mouth 2 (two) times daily. Aged Garlic Extract. Taking at 2:00 am and 2:00 pm  . Gymnema Sylvestris Leaf POWD Take 400 mg by mouth 2 (two) times daily. Taking at 1:00 am and 1:00 pm  . milk thistle 175 MG tablet Take 175 mg by mouth 2 (two) times daily. Taking at 1:00 am and 1:00 pm  . Misc Natural Products (BETA-SITOSTEROL PLANT STEROLS) CAPS Take 60 mg by mouth 2 (two) times daily. Taking at 3:00 and 3:00 pm  . OVER THE COUNTER MEDICATION Take 750 mg by mouth 2 (two) times daily. Curamed. Taking 1 tablet at 1:00 am, and 1 tablet at 1:00 pm.  . RESVERATROL PO Take 500 mg by mouth daily. Taking at 1:00 am.  . rosuvastatin (CRESTOR) 5 MG tablet Take 1 tablet (5 mg total) by mouth daily.  . tadalafil (CIALIS) 5 MG tablet TAKE ONE TABLET EVERY DAY  . vitamin E 400 UNIT capsule Take 400 Units by mouth daily. Taking at 1:00 am  . Vitamin Mixture (ESTER-C PO) Take 1,000 mg by mouth daily. Taking at 1:00 am  . Cinnamon 500 MG capsule Take 500 mg by mouth 2 (two) times daily. Taking at 1:00 am and 1:00 pm. (Patient not taking: Reported on 09/10/2020)  . Levomefolate Glucosamine (METHYLFOLATE PO) Take 1,000 mcg by mouth daily. Taking at 1:00  am.  . PHOSPHATIDYLSERINE PO Take 300 mg by mouth daily. Taking at 1:00 am  . triamcinolone ointment (KENALOG) 0.5 % Apply 1 application topically 2 (two) times daily. (Patient not taking: Reported on 09/10/2020)   No facility-administered encounter medications on file as of 09/10/2020.    Allergies (verified) Patient has no known allergies.   History: Past Medical History:  Diagnosis Date  . Basal cell carcinoma 10/08/2019   Right calf. Nodular  . Basal cell carcinoma 10/08/2019   Right mid medial dorsum forearm.  Nodular and infiltrative  . Basal cell carcinoma 11/05/2019   Left superior pectoral/medial infraclavicular. Nodular.  . Cataract   . Hypertension   . Thyroid disease    Past Surgical History:  Procedure Laterality Date  . EYE SURGERY    . HERNIA REPAIR  1998   abdominal   Family History  Problem Relation Age of Onset  . Healthy Mother   . Healthy Father   . Colon cancer Neg Hx   . Prostate cancer Neg Hx    Social History   Socioeconomic History  . Marital status: Married    Spouse name: Tommie  . Number of children: 2  . Years of education: Not on file  . Highest education level: Associate degree: occupational, Hotel manager, or vocational program  Occupational History  . Occupation: President/CEO    Comment: Sport and exercise psychologist  Tobacco Use  . Smoking status: Never Smoker  . Smokeless tobacco: Never Used  Vaping Use  . Vaping Use: Never used  Substance and Sexual Activity  . Alcohol use: No  . Drug use: No  . Sexual activity: Yes    Partners: Female  Other Topics Concern  . Not on file  Social History Narrative  . Not on file   Social Determinants of Health   Financial Resource Strain: Low Risk   . Difficulty of Paying Living Expenses: Not hard at all  Food Insecurity: No Food Insecurity  . Worried About Charity fundraiser in the Last Year: Never true  . Ran Out of Food in the Last Year: Never true  Transportation Needs: No Transportation Needs  . Lack of Transportation (Medical): No  . Lack of Transportation (Non-Medical): No  Physical Activity: Sufficiently Active  . Days of Exercise per Week: 5 days  . Minutes of Exercise per Session: 60 min  Stress: No Stress Concern Present  . Feeling of Stress : Not at all  Social Connections: Moderately Isolated  . Frequency of Communication with Friends and Family: More than three times a week  . Frequency of Social Gatherings with Friends and Family: More than three times a week  . Attends Religious  Services: Never  . Active Member of Clubs or Organizations: No  . Attends Archivist Meetings: Never  . Marital Status: Married    Tobacco Counseling Counseling given: Not Answered   Clinical Intake:  Pre-visit preparation completed: Yes  Pain : No/denies pain     Nutritional Risks: None Diabetes: No  How often do you need to have someone help you when you read instructions, pamphlets, or other written materials from your doctor or pharmacy?: 1 - Never  Diabetic? No  Interpreter Needed?: No  Information entered by :: [facility], LPN   Activities of Daily Living In your present state of health, do you have any difficulty performing the following activities: 09/10/2020  Hearing? N  Vision? N  Difficulty concentrating or making decisions? N  Walking or climbing stairs? N  Dressing or bathing? N  Doing errands, shopping? N  Preparing Food and eating ? N  Using the Toilet? N  In the past six months, have you accidently leaked urine? N  Do you have problems with loss of bowel control? N  Managing your Medications? N  Managing your Finances? N  Housekeeping or managing your Housekeeping? N  Some recent data might be hidden    Patient Care Team: Virginia Crews, MD as PCP - General (Family Medicine) Corey Skains, MD as Consulting Physician (Cardiology) Royston Cowper, MD as Consulting Physician (Urology)  Indicate any recent Medical Services you may have received from other than [facility] providers in the past year (date may be approximate).     Assessment:   This is a routine wellness examination for Devaris.  Hearing/Vision screen No exam data present  Dietary issues and exercise activities discussed: Current Exercise Habits: Structured exercise class;Home exercise routine, Type of exercise: strength training/weights;walking, Time (Minutes): 60, Frequency (Times/Week): 5, Weekly Exercise (Minutes/Week): 300, Intensity: Mild, Exercise limited by:  None identified  Goals    . DIET - INCREASE WATER INTAKE     Recommend to drink at least 6-8 8oz glasses of water per day.       Depression Screen PHQ 2/9 Scores 09/10/2020 09/09/2019 08/29/2018 02/27/2018  PHQ - 2 Score 0 0 0 0    Fall Risk Fall Risk  09/10/2020 09/09/2019 08/29/2018 02/27/2018  Falls in the past year? 0 0 0 No  Number falls in past yr: 0 0 - -  Injury with Fall? 0 0 - -    FALL RISK PREVENTION PERTAINING TO THE HOME:  Any stairs in or around the home? Yes  If so, are there any without handrails? No  Home free of loose throw rugs in walkways, pet beds, electrical cords, etc? Yes  Adequate lighting in your home to reduce risk of falls? Yes   ASSISTIVE DEVICES UTILIZED TO PREVENT FALLS:  Life alert? No  Use of a cane, walker or w/c? No  Grab bars in the bathroom? Yes  Shower chair or bench in shower? Yes  Elevated toilet seat or a handicapped toilet? No    Cognitive Function: Normal cognitive status assessed by observation by this Nurse Health Advisor. No abnormalities found.         Immunizations Immunization History  Administered Date(s) Administered  . Fluad Quad(high Dose 65+) 06/04/2019  . Influenza-Unspecified 06/09/2020  . PFIZER SARS-COV-2 Vaccination 10/17/2019, 11/04/2019  . Pneumococcal Conjugate-13 12/20/2016  . Pneumococcal Polysaccharide-23 02/27/2018  . Zoster Recombinat (Shingrix) 02/27/2018, 04/30/2018    TDAP status: Due, Education has been provided regarding the importance of this vaccine. Advised may receive this vaccine at local pharmacy or Health Dept. Aware to provide a copy of the vaccination record if obtained from local pharmacy or Health Dept. Verbalized acceptance and understanding.  Flu Vaccine status: Up to date  Pneumococcal vaccine status: Up to date  Covid-19 vaccine status: Completed vaccines  Qualifies for Shingles Vaccine? Yes   Zostavax completed No   Shingrix Completed?: Yes  Screening Tests Health  Maintenance  Topic Date Due  . COVID-19 Vaccine (3 - Pfizer risk 4-dose series) 12/02/2019  . TETANUS/TDAP  09/10/2021 (Originally 01/26/1965)  . Fecal DNA (Cologuard)  08/08/2021  . INFLUENZA VACCINE  Completed  . Hepatitis C Screening  Completed  . PNA vac Low Risk Adult  Completed    Health Maintenance  Health Maintenance Due  Topic Date Due  . COVID-19  Vaccine (3 - Pfizer risk 4-dose series) 12/02/2019    Colorectal cancer screening: Type of screening: Cologuard. Completed 08/08/18. Repeat every 3 years  Lung Cancer Screening: (Low Dose CT Chest recommended if Age 53-80 years, 30 pack-year currently smoking OR have quit w/in 15years.) does not qualify.    Additional Screening:  Hepatitis C Screening: Up to date  Vision Screening: Recommended annual ophthalmology exams for early detection of glaucoma and other disorders of the eye. Is the patient up to date with their annual eye exam?  Yes  Who is the provider or what is the name of the office in which the patient attends annual eye exams? [facility] in [place] If pt is not established with a provider, would they like to be referred to a provider to establish care? No .   Dental Screening: Recommended annual dental exams for proper oral hygiene  Community Resource Referral / Chronic Care Management: CRR required this visit?  No   CCM required this visit?  No      Plan:     I have personally reviewed and noted the following in the patient's chart:   . Medical and social history . Use of alcohol, tobacco or illicit drugs  . Current medications and supplements . Functional ability and status . Nutritional status . Physical activity . Advanced directives . List of other physicians . Hospitalizations, surgeries, and ER visits in previous 12 months . Vitals . Screenings to include cognitive, depression, and falls . Referrals and appointments  In addition, I have reviewed and discussed with patient certain  preventive protocols, quality metrics, and best practice recommendations. A written personalized care plan for preventive services as well as general preventive health recommendations were provided to patient.     Terasa Orsini Sim[place], [place]   19/14/7829   Nurse Notes: Pt states he has received his Covid booster. Requested that he bring the vaccine card into today's OV or scan to his MyChart.

## 2020-09-10 ENCOUNTER — Ambulatory Visit (INDEPENDENT_AMBULATORY_CARE_PROVIDER_SITE_OTHER): Payer: Medicare Other

## 2020-09-10 ENCOUNTER — Ambulatory Visit (INDEPENDENT_AMBULATORY_CARE_PROVIDER_SITE_OTHER): Payer: Medicare Other | Admitting: Family Medicine

## 2020-09-10 ENCOUNTER — Other Ambulatory Visit: Payer: Self-pay

## 2020-09-10 ENCOUNTER — Encounter: Payer: Self-pay | Admitting: Family Medicine

## 2020-09-10 VITALS — BP 120/75 | HR 71 | Temp 98.0°F | Ht 71.0 in | Wt 200.0 lb

## 2020-09-10 DIAGNOSIS — Z0000 Encounter for general adult medical examination without abnormal findings: Secondary | ICD-10-CM

## 2020-09-10 DIAGNOSIS — E663 Overweight: Secondary | ICD-10-CM

## 2020-09-10 DIAGNOSIS — E039 Hypothyroidism, unspecified: Secondary | ICD-10-CM | POA: Diagnosis not present

## 2020-09-10 DIAGNOSIS — I10 Essential (primary) hypertension: Secondary | ICD-10-CM

## 2020-09-10 DIAGNOSIS — E785 Hyperlipidemia, unspecified: Secondary | ICD-10-CM | POA: Diagnosis not present

## 2020-09-10 NOTE — Assessment & Plan Note (Signed)
Previously well controlled Continue Synthroid at current dose  Recheck TSH and adjust Synthroid as indicated   

## 2020-09-10 NOTE — Assessment & Plan Note (Signed)
Discussed importance of healthy weight management Discussed diet and exercise  

## 2020-09-10 NOTE — Patient Instructions (Signed)
Gregory Mckay , Thank you for taking time to come for your Medicare Wellness Visit. I appreciate your ongoing commitment to your health goals. Please review the following plan we discussed and let me know if I can assist you in the future.   Screening recommendations/referrals: Colonoscopy: Cologuard up to date, due 07/2021 Recommended yearly ophthalmology/optometry visit for glaucoma screening and checkup Recommended yearly dental visit for hygiene and checkup  Vaccinations: Influenza vaccine: Done 06/09/20 Pneumococcal vaccine: Completed series Tdap vaccine: Currently due, declined at this time.  Shingles vaccine: Completed series    Advanced directives: Advance directive discussed with you today. Even though you declined this today please call our office should you change your mind and we can give you the proper paperwork for you to fill out.  Conditions/risks identified: Recommend to drink at least 6-8 8oz glasses of water per day.  Next appointment: 10:00 AM with Dr Brita Romp. Declined scheduling an AWV for 2022 at this time.   Preventive Care 50 Years and Older, Male Preventive care refers to lifestyle choices and visits with your health care provider that can promote health and wellness. What does preventive care include?  A yearly physical exam. This is also called an annual well check.  Dental exams once or twice a year.  Routine eye exams. Ask your health care provider how often you should have your eyes checked.  Personal lifestyle choices, including:  Daily care of your teeth and gums.  Regular physical activity.  Eating a healthy diet.  Avoiding tobacco and drug use.  Limiting alcohol use.  Practicing safe sex.  Taking low doses of aspirin every day.  Taking vitamin and mineral supplements as recommended by your health care provider. What happens during an annual well check? The services and screenings done by your health care provider during your annual well  check will depend on your age, overall health, lifestyle risk factors, and family history of disease. Counseling  Your health care provider may ask you questions about your:  Alcohol use.  Tobacco use.  Drug use.  Emotional well-being.  Home and relationship well-being.  Sexual activity.  Eating habits.  History of falls.  Memory and ability to understand (cognition).  Work and work Statistician. Screening  You may have the following tests or measurements:  Height, weight, and BMI.  Blood pressure.  Lipid and cholesterol levels. These may be checked every 5 years, or more frequently if you are over 21 years old.  Skin check.  Lung cancer screening. You may have this screening every year starting at age 71 if you have a 30-pack-year history of smoking and currently smoke or have quit within the past 15 years.  Fecal occult blood test (FOBT) of the stool. You may have this test every year starting at age 29.  Flexible sigmoidoscopy or colonoscopy. You may have a sigmoidoscopy every 5 years or a colonoscopy every 10 years starting at age 75.  Prostate cancer screening. Recommendations will vary depending on your family history and other risks.  Hepatitis C blood test.  Hepatitis B blood test.  Sexually transmitted disease (STD) testing.  Diabetes screening. This is done by checking your blood sugar (glucose) after you have not eaten for a while (fasting). You may have this done every 1-3 years.  Abdominal aortic aneurysm (AAA) screening. You may need this if you are a current or former smoker.  Osteoporosis. You may be screened starting at age 52 if you are at high risk. Talk with your health  care provider about your test results, treatment options, and if necessary, the need for more tests. Vaccines  Your health care provider may recommend certain vaccines, such as:  Influenza vaccine. This is recommended every year.  Tetanus, diphtheria, and acellular  pertussis (Tdap, Td) vaccine. You may need a Td booster every 10 years.  Zoster vaccine. You may need this after age 75.  Pneumococcal 13-valent conjugate (PCV13) vaccine. One dose is recommended after age 17.  Pneumococcal polysaccharide (PPSV23) vaccine. One dose is recommended after age 51. Talk to your health care provider about which screenings and vaccines you need and how often you need them. This information is not intended to replace advice given to you by your health care provider. Make sure you discuss any questions you have with your health care provider. Document Released: 10/09/2015 Document Revised: 06/01/2016 Document Reviewed: 07/14/2015 Elsevier Interactive Patient Education  2017 [place] Prevention in the Home Falls can cause injuries. They can happen to people of all ages. There are many things you can do to make your home safe and to help prevent falls. What can I do on the outside of my home?  Regularly fix the edges of walkways and driveways and fix any cracks.  Remove anything that might make you trip as you walk through a door, such as a raised step or threshold.  Trim any bushes or trees on the path to your home.  Use bright outdoor lighting.  Clear any walking paths of anything that might make someone trip, such as rocks or tools.  Regularly check to see if handrails are loose or broken. Make sure that both sides of any steps have handrails.  Any raised decks and porches should have guardrails on the edges.  Have any leaves, snow, or ice cleared regularly.  Use sand or salt on walking paths during winter.  Clean up any spills in your garage right away. This includes oil or grease spills. What can I do in the bathroom?  Use night lights.  Install grab bars by the toilet and in the tub and shower. Do not use towel bars as grab bars.  Use non-skid mats or decals in the tub or shower.  If you need to sit down in the shower, use a plastic,  non-slip stool.  Keep the floor dry. Clean up any water that spills on the floor as soon as it happens.  Remove soap buildup in the tub or shower regularly.  Attach bath mats securely with double-sided non-slip rug tape.  Do not have throw rugs and other things on the floor that can make you trip. What can I do in the bedroom?  Use night lights.  Make sure that you have a light by your bed that is easy to reach.  Do not use any sheets or blankets that are too big for your bed. They should not hang down onto the floor.  Have a firm chair that has side arms. You can use this for support while you get dressed.  Do not have throw rugs and other things on the floor that can make you trip. What can I do in the kitchen?  Clean up any spills right away.  Avoid walking on wet floors.  Keep items that you use a lot in easy-to-reach places.  If you need to reach something above you, use a strong step stool that has a grab bar.  Keep electrical cords out of the way.  Do not use floor  polish or wax that makes floors slippery. If you must use wax, use non-skid floor wax.  Do not have throw rugs and other things on the floor that can make you trip. What can I do with my stairs?  Do not leave any items on the stairs.  Make sure that there are handrails on both sides of the stairs and use them. Fix handrails that are broken or loose. Make sure that handrails are as long as the stairways.  Check any carpeting to make sure that it is firmly attached to the stairs. Fix any carpet that is loose or worn.  Avoid having throw rugs at the top or bottom of the stairs. If you do have throw rugs, attach them to the floor with carpet tape.  Make sure that you have a light switch at the top of the stairs and the bottom of the stairs. If you do not have them, ask someone to add them for you. What else can I do to help prevent falls?  Wear shoes that:  Do not have high heels.  Have rubber  bottoms.  Are comfortable and fit you well.  Are closed at the toe. Do not wear sandals.  If you use a stepladder:  Make sure that it is fully opened. Do not climb a closed stepladder.  Make sure that both sides of the stepladder are locked into place.  Ask someone to hold it for you, if possible.  Clearly mark and make sure that you can see:  Any grab bars or handrails.  First and last steps.  Where the edge of each step is.  Use tools that help you move around (mobility aids) if they are needed. These include:  Canes.  Walkers.  Scooters.  Crutches.  Turn on the lights when you go into a dark area. Replace any light bulbs as soon as they burn out.  Set up your furniture so you have a clear path. Avoid moving your furniture around.  If any of your floors are uneven, fix them.  If there are any pets around you, be aware of where they are.  Review your medicines with your doctor. Some medicines can make you feel dizzy. This can increase your chance of falling. Ask your doctor what other things that you can do to help prevent falls. This information is not intended to replace advice given to you by your health care provider. Make sure you discuss any questions you have with your health care provider. Document Released: 07/09/2009 Document Revised: 02/18/2016 Document Reviewed: 10/17/2014 Elsevier Interactive Patient Education  2017 [employer].

## 2020-09-10 NOTE — Assessment & Plan Note (Signed)
Previously well controlled Continue statin Repeat FLP and CMP  

## 2020-09-10 NOTE — Patient Instructions (Signed)

## 2020-09-10 NOTE — Progress Notes (Signed)
Annual Wellness Visit     Patient: Gregory Mckay, Male    DOB: Oct 06, 1945, 74 y.o.   MRN: [id number] Visit Date: 09/10/2020  Today's Provider: Lavon Paganini, MD   Chief Complaint  Patient presents with   Annual Exam   Subjective    Gregory Mckay is a 74 y.o. male who presents today for his Annual Wellness Visit. He reports consuming a general diet. Exercises regularly. He generally feels well. He reports sleeping well. He does not have additional problems to discuss today.   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 Nodular   Basal cell carcinoma 10/08/2019   Right mid medial dorsum forearm. Nodular and infiltrative   Basal cell carcinoma 11/05/2019   Left superior pectoral/medial infraclavicular. Nodular.   Cataract    Hypertension    Thyroid disease    Social History   Tobacco Use   Smoking status: Never Smoker   Smokeless tobacco: Never Used  Vaping Use   Vaping Use: Never used  Substance Use Topics   Alcohol use: No   Drug use: No   No Known Allergies   Medications: Outpatient Medications Prior to Visit  Medication Sig   amLODipine-benazepril (LOTREL) 5-10 MG capsule TAKE 1 CAPSULE EVERY DAY   ARMOUR THYROID 60 MG tablet TAKE ONE TABLET EVERY DAY BEFORE BREAKFAST   Biotin 5000 MCG CAPS Take 1 capsule by mouth daily. Taking at 1:00 am   Cholecalciferol (VITAMIN D3) 10000 units TABS Take 1 tablet by mouth daily. Taking at 1:00 am   Coenzyme Q10 (COQ-10) 50 MG CAPS Take 50 mg by mouth daily. Taking at 8:34 am.   GARLIC PO Take 196 mg by mouth 2 (two) times daily. Aged Garlic Extract. Taking at 2:00 am and 2:00 pm   Gymnema Sylvestris Leaf POWD Take 400  mg by mouth 2 (two) times daily. Taking at 1:00 am and 1:00 pm   Levomefolate Glucosamine (METHYLFOLATE PO) Take 1,000 mcg by mouth daily. Taking at 1:00 am.   milk thistle 175 MG tablet Take 175 mg by mouth 2 (two) times daily. Taking at 1:00 am and 1:00 pm   Misc Natural Products (BETA-SITOSTEROL PLANT STEROLS) CAPS Take 60 mg by mouth 2 (two) times daily. Taking at 3:00 and 3:00 pm   OVER THE COUNTER MEDICATION Take 750 mg by mouth 2 (two) times daily. Curamed. Taking 1 tablet at 1:00 am, and 1 tablet at 1:00 pm.   PHOSPHATIDYLSERINE PO Take 300 mg by mouth daily. Taking at 1:00 am   RESVERATROL PO Take 500 mg by mouth daily. Taking at 1:00 am.   rosuvastatin (CRESTOR) 5 MG tablet Take 1 tablet (5 mg total) by mouth daily.   tadalafil (CIALIS) 5 MG tablet TAKE ONE TABLET EVERY DAY   triamcinolone ointment (KENALOG) 0.5 % Apply 1 application topically 2 (two) times daily.   vitamin E 400 UNIT capsule Take 400 Units by mouth daily. Taking at 1:00 am   Vitamin Mixture (ESTER-C PO) Take 1,000 mg by mouth daily. Taking at 1:00 am   [DISCONTINUED] Cinnamon 500 MG capsule Take 500 mg by mouth 2 (two) times daily. Taking at 1:00 am and 1:00 pm.   No facility-administered  medications prior to visit.    No Known Allergies  Patient Care Team: Virginia Crews, MD as PCP - General (Family Medicine) Corey Skains, MD as Consulting Physician (Cardiology) Royston Cowper, MD as Consulting Physician (Urology)  Review of Systems  Constitutional: Negative.   HENT: Negative.   Eyes: Negative.   Respiratory: Negative.   Cardiovascular: Negative.   Gastrointestinal: Negative.   Endocrine: Negative.   Genitourinary: Negative.   Musculoskeletal: Negative.   Skin: Negative.   Allergic/Immunologic: Negative.   Neurological: Negative.   Hematological: Negative.   Psychiatric/Behavioral: Negative.       Objective    Vitals: BP 120/75 (BP Location: Right Arm, Patient Position:  Sitting, Cuff Size: Large)    Pulse 71    Temp 98 F (36.7 C) (Oral)    Ht 5\' 11"  (1.803 m)    Wt 200 lb (90.7 kg)    BMI 27.89 kg/m    Physical Exam Vitals reviewed.  Constitutional:      General: He is not in acute distress.    Appearance: Normal appearance. He is well-developed. He is not diaphoretic.  HENT:     Head: Normocephalic and atraumatic.     Right Ear: Tympanic membrane, ear canal and external ear normal.     Left Ear: Tympanic membrane, ear canal and external ear normal.  Eyes:     General: No scleral icterus.    Conjunctiva/sclera: Conjunctivae normal.     Pupils: Pupils are equal, round, and reactive to light.  Neck:     Thyroid: No thyromegaly.  Cardiovascular:     Rate and Rhythm: Normal rate and regular rhythm.     Pulses: Normal pulses.     Heart sounds: Normal heart sounds. No murmur heard.   Pulmonary:     Effort: Pulmonary effort is normal. No respiratory distress.     Breath sounds: Normal breath sounds. No wheezing or rales.  Abdominal:     General: There is no distension.     Palpations: Abdomen is soft.     Tenderness: There is no abdominal tenderness.  Musculoskeletal:        General: No deformity.     Cervical back: Neck supple.     Right lower leg: No edema.     Left lower leg: No edema.  Lymphadenopathy:     Cervical: No cervical adenopathy.  Skin:    General: Skin is warm and dry.     Findings: No rash.  Neurological:     Mental Status: He is alert and oriented to person, place, and time. Mental status is at baseline.     Sensory: No sensory deficit.     Motor: No weakness.     Gait: Gait normal.  Psychiatric:        Mood and Affect: Mood normal.        Behavior: Behavior normal.        Thought Content: Thought content normal.     Most recent functional status assessment: In your present state of health, do you have any difficulty performing the following activities: 09/10/2020  Hearing? N  Vision? N  Difficulty concentrating  or making decisions? N  Walking or climbing stairs? N  Dressing or bathing? N  Doing errands, shopping? N  Preparing Food and eating ? N  Using the Toilet? N  In the past six months, have you accidently leaked urine? N  Do you have problems with loss of bowel control? N  Managing your Medications?  N  Managing your Finances? N  Housekeeping or managing your Housekeeping? N  Some recent data might be hidden   Most recent fall risk assessment: Fall Risk  09/10/2020  Falls in the past year? 0  Number falls in past yr: 0  Injury with Fall? 0    Most recent depression screenings: PHQ 2/9 Scores 09/10/2020 09/10/2020  PHQ - 2 Score 0 0  PHQ- 9 Score 0 -   Most recent cognitive screening: No flowsheet data found. Most recent Audit-C alcohol use screening Alcohol Use Disorder Test (AUDIT) 09/10/2020  1. How often do you have a drink containing alcohol? 0  2. How many drinks containing alcohol do you have on a typical day when you are drinking? 0  3. How often do you have six or more drinks on one occasion? 0  AUDIT-C Score 0  Alcohol Brief Interventions/Follow-up AUDIT Score <7 follow-up not indicated   A score of 3 or more in women, and 4 or more in men indicates increased risk for alcohol abuse, EXCEPT if all of the points are from question 1   No results found for any visits on 09/10/20.  Assessment & Plan     Annual wellness visit done today including the all of the following: Reviewed patient's Family Medical History Reviewed and updated list of patient's medical providers Assessment of cognitive impairment was done Assessed patient's functional ability Established a written schedule for health screening [place] Completed and Reviewed  Exercise Activities and Dietary recommendations Goals     DIET - INCREASE WATER INTAKE     Recommend to drink at least 6-8 8oz glasses of water per day.        Immunization History  Administered Date(s)  Administered   Fluad Quad(high Dose 65+) 06/04/2019   Influenza-Unspecified 06/09/2020   PFIZER SARS-COV-2 Vaccination 10/17/2019, 11/04/2019   Pneumococcal Conjugate-13 12/20/2016   Pneumococcal Polysaccharide-23 02/27/2018   Zoster Recombinat (Shingrix) 02/27/2018, 04/30/2018    Health Maintenance  Topic Date Due   COVID-19 Vaccine (3 - Pfizer risk 4-dose series) 12/02/2019   TETANUS/TDAP  09/10/2021 (Originally 01/26/1965)   Fecal DNA (Cologuard)  08/08/2021   INFLUENZA VACCINE  Completed   Hepatitis C Screening  Completed   PNA vac Low Risk Adult  Completed     Discussed health benefits of physical activity, and encouraged him to engage in regular exercise appropriate for his age and condition.    Problem List Items Addressed This Visit      Cardiovascular and Mediastinum   Hypertension    Well controlled Continue current medications Recheck metabolic panel F/u in 6 months       Relevant Orders   Comprehensive metabolic panel     Endocrine   Hypothyroidism    Previously well controlled Continue Synthroid at current dose  Recheck TSH and adjust Synthroid as indicated        Relevant Orders   TSH     Other   Hyperlipidemia    Previously well controlled Continue statin Repeat FLP and CMP      Relevant Orders   Lipid panel   Comprehensive metabolic panel   Overweight    Discussed importance of healthy weight management Discussed diet and exercise       Relevant Orders   Lipid panel   Comprehensive metabolic panel    Other Visit Diagnoses    Encounter for annual physical exam    -  Primary   Relevant Orders   TSH  Lipid panel   Comprehensive metabolic panel       Return in about 6 months (around 03/11/2021) for chronic disease f/u.     I, Lavon Paganini, MD, have reviewed all documentation for this visit. The documentation on 09/10/20 for the exam, diagnosis, procedures, and orders are all accurate and complete.   Jamee Pacholski,  Dionne Bucy, MD, MPH [facility]

## 2020-09-10 NOTE — Assessment & Plan Note (Signed)
Well controlled Continue current medications Recheck metabolic panel F/u in 6 months  

## 2020-09-11 ENCOUNTER — Telehealth: Payer: Self-pay

## 2020-09-11 LAB — COMPREHENSIVE METABOLIC PANEL
ALT: 16 IU/L (ref 0–44)
AST: 13 IU/L (ref 0–40)
Albumin/Globulin Ratio: 1.7 (ref 1.2–2.2)
Albumin: 4.3 g/dL (ref 3.7–4.7)
Alkaline Phosphatase: 40 IU/L — ABNORMAL LOW (ref 44–121)
BUN/Creatinine Ratio: 17 (ref 10–24)
BUN: 24 mg/dL (ref 8–27)
Bilirubin Total: 0.4 mg/dL (ref 0.0–1.2)
CO2: 27 mmol/L (ref 20–29)
Calcium: 10.1 mg/dL (ref 8.6–10.2)
Chloride: 103 mmol/L (ref 96–106)
Creatinine, Ser: 1.39 mg/dL — ABNORMAL HIGH (ref 0.76–1.27)
GFR calc Af Amer: 58 mL/min/{1.73_m2} — ABNORMAL LOW (ref >59)
GFR calc non Af Amer: 50 mL/min/{1.73_m2} — ABNORMAL LOW (ref >59)
Globulin, Total: 2.6 g/dL (ref 1.5–4.5)
Glucose: 100 mg/dL — ABNORMAL HIGH (ref 65–99)
Potassium: 4.8 mmol/L (ref 3.5–5.2)
Sodium: 142 mmol/L (ref 134–144)
Total Protein: 6.9 g/dL (ref 6.0–8.5)

## 2020-09-11 LAB — LIPID PANEL
Chol/HDL Ratio: 5.3 ratio — ABNORMAL HIGH (ref 0.0–5.0)
Cholesterol, Total: 126 mg/dL (ref 100–199)
HDL: 24 mg/dL — ABNORMAL LOW (ref >39)
LDL Chol Calc (NIH): 70 mg/dL (ref 0–99)
Triglycerides: 191 mg/dL — ABNORMAL HIGH (ref 0–149)
VLDL Cholesterol Cal: 32 mg/dL (ref 5–40)

## 2020-09-11 LAB — TSH: TSH: 1.88 u[IU]/mL (ref 0.450–4.500)

## 2020-09-11 NOTE — Telephone Encounter (Signed)
LMTCB 09/11/2020.  PEC please advise pt when he calls back.    Thanks,   -Mickel Baas

## 2020-09-11 NOTE — Telephone Encounter (Signed)
-----   Message from Virginia Crews, MD sent at 09/11/2020  8:14 AM EST ----- Normal/stable labs

## 2020-09-14 DIAGNOSIS — E7800 Pure hypercholesterolemia, unspecified: Secondary | ICD-10-CM | POA: Diagnosis not present

## 2020-09-14 DIAGNOSIS — D519 Vitamin B12 deficiency anemia, unspecified: Secondary | ICD-10-CM | POA: Diagnosis not present

## 2020-09-14 DIAGNOSIS — R5383 Other fatigue: Secondary | ICD-10-CM | POA: Diagnosis not present

## 2020-09-14 DIAGNOSIS — R5381 Other malaise: Secondary | ICD-10-CM | POA: Diagnosis not present

## 2020-09-14 DIAGNOSIS — D509 Iron deficiency anemia, unspecified: Secondary | ICD-10-CM | POA: Diagnosis not present

## 2020-09-14 DIAGNOSIS — R946 Abnormal results of thyroid function studies: Secondary | ICD-10-CM | POA: Diagnosis not present

## 2020-10-21 DIAGNOSIS — E039 Hypothyroidism, unspecified: Secondary | ICD-10-CM | POA: Diagnosis not present

## 2020-10-21 DIAGNOSIS — E559 Vitamin D deficiency, unspecified: Secondary | ICD-10-CM | POA: Diagnosis not present

## 2020-10-21 DIAGNOSIS — E7800 Pure hypercholesterolemia, unspecified: Secondary | ICD-10-CM | POA: Diagnosis not present

## 2020-11-23 ENCOUNTER — Encounter: Payer: Self-pay | Admitting: Dermatology

## 2020-11-23 ENCOUNTER — Ambulatory Visit: Payer: Medicare Other | Admitting: Dermatology

## 2020-11-23 ENCOUNTER — Other Ambulatory Visit: Payer: Self-pay

## 2020-11-23 DIAGNOSIS — L02212 Cutaneous abscess of back [any part, except buttock]: Secondary | ICD-10-CM

## 2020-11-23 DIAGNOSIS — L0291 Cutaneous abscess, unspecified: Secondary | ICD-10-CM

## 2020-11-23 DIAGNOSIS — L820 Inflamed seborrheic keratosis: Secondary | ICD-10-CM | POA: Diagnosis not present

## 2020-11-23 MED ORDER — DOXYCYCLINE HYCLATE 100 MG PO TABS: ORAL_TABLET | ORAL | 0 refills | Status: DC

## 2020-11-23 NOTE — Progress Notes (Signed)
   Follow-Up Visit   Subjective  Gregory Mckay is a 75 y.o. male who presents for the following: Lesion (On the back - irregular appearing, patient is concerned and would like it checked). He also was recently treated with ciprofloxacin for draining abscess of his right lower back where he had testosterone pellets implanted.  Apparently they got infected and the area drained pus and some of the pellets fell out.  It has continued to drain pus despite taking and finishing ciprofloxacin.  He would like this area checked.  Patient's wife is with him and contributes to history.  The following portions of the chart were reviewed this encounter and updated as appropriate:   Tobacco  Allergies  Meds  Problems  Med Hx  Surg Hx  Fam Hx     Review of Systems:  No other skin or systemic complaints except as noted in HPI or Assessment and Plan.  Objective  Well appearing patient in no apparent distress; mood and affect are within normal limits.  A focused examination was performed including the back. Relevant physical exam findings are noted in the Assessment and Plan.  Objective  Right Lower Back 3.0 cm sup to abscess x 1: Erythematous keratotic or waxy stuck-on papule or plaque.   Objective  Right Lower Back: Abscess  Assessment & Plan  Inflamed seborrheic keratosis Right Lower Back 3.0 cm sup to abscess x 1 -recheck in 2 months.  Destruction of lesion - Right Lower Back 3.0 cm sup to abscess x 1 Complexity: simple   Destruction method: cryotherapy   Informed consent: discussed and consent obtained   Timeout:  patient name, date of birth, surgical site, and procedure verified Lesion destroyed using liquid nitrogen: Yes   Region frozen until ice ball extended beyond lesion: Yes   Outcome: patient tolerated procedure well with no complications   Post-procedure details: wound care instructions given    Abscess Right Lower Back Persistent and recurrent even after course of  ciprofloxacin - The abscess is at site of testosterone pellet implantation.    I advised the patient and his wife that this area would benefit from incision and drainage (I&D).  Nevertheless, because I did not perform the procedure of implanting his testosterone pellets,  I am concerned about causing a problem with loss of these pellets if I performed the procedure not knowing how they were implanted or how deeply they were implanted etc, I recommend he see the physician who implanted them and who has evaluated and treated him recently for his abscess in this area. He and his wife agree that they will see the physician who implanted these, who I believe he said was his primary care physician (PCP).   Start Doxycycline 100mg  po BID x 10 days. Doxycycline should be taken with food to prevent nausea. Do not lay down for 30 minutes after taking. Be cautious with sun exposure and use good sun protection while on this medication. Pregnant women should not take this medication.   doxycycline (VIBRA-TABS) 100 MG tablet - Right Lower Back  Return in about 2 months (around 01/21/2021).  Luther Redo, CMA, am acting as scribe for Sarina Ser, MD .  Documentation: I have reviewed the above documentation for accuracy and completeness, and I agree with the above.  Sarina Ser, MD

## 2020-11-24 ENCOUNTER — Encounter: Payer: Self-pay | Admitting: Dermatology

## 2020-12-02 DIAGNOSIS — I10 Essential (primary) hypertension: Secondary | ICD-10-CM | POA: Diagnosis not present

## 2020-12-02 DIAGNOSIS — E782 Mixed hyperlipidemia: Secondary | ICD-10-CM | POA: Diagnosis not present

## 2020-12-16 DIAGNOSIS — E291 Testicular hypofunction: Secondary | ICD-10-CM | POA: Diagnosis not present

## 2020-12-23 DIAGNOSIS — Z79890 Hormone replacement therapy (postmenopausal): Secondary | ICD-10-CM | POA: Diagnosis not present

## 2020-12-23 DIAGNOSIS — E291 Testicular hypofunction: Secondary | ICD-10-CM | POA: Diagnosis not present

## 2020-12-23 DIAGNOSIS — R5383 Other fatigue: Secondary | ICD-10-CM | POA: Diagnosis not present

## 2020-12-23 DIAGNOSIS — E781 Pure hyperglyceridemia: Secondary | ICD-10-CM | POA: Diagnosis not present

## 2021-01-06 DIAGNOSIS — R972 Elevated prostate specific antigen [PSA]: Secondary | ICD-10-CM | POA: Diagnosis not present

## 2021-01-06 DIAGNOSIS — N401 Enlarged prostate with lower urinary tract symptoms: Secondary | ICD-10-CM | POA: Diagnosis not present

## 2021-01-11 DIAGNOSIS — M7022 Olecranon bursitis, left elbow: Secondary | ICD-10-CM | POA: Diagnosis not present

## 2021-01-12 DIAGNOSIS — N401 Enlarged prostate with lower urinary tract symptoms: Secondary | ICD-10-CM | POA: Diagnosis not present

## 2021-01-12 DIAGNOSIS — Z79899 Other long term (current) drug therapy: Secondary | ICD-10-CM | POA: Diagnosis not present

## 2021-01-12 DIAGNOSIS — R972 Elevated prostate specific antigen [PSA]: Secondary | ICD-10-CM | POA: Diagnosis not present

## 2021-01-12 DIAGNOSIS — N5201 Erectile dysfunction due to arterial insufficiency: Secondary | ICD-10-CM | POA: Diagnosis not present

## 2021-01-18 ENCOUNTER — Other Ambulatory Visit: Payer: Self-pay | Admitting: Family Medicine

## 2021-01-18 DIAGNOSIS — E039 Hypothyroidism, unspecified: Secondary | ICD-10-CM

## 2021-01-20 ENCOUNTER — Ambulatory Visit: Payer: Medicare Other | Admitting: Dermatology

## 2021-02-10 ENCOUNTER — Telehealth: Payer: Self-pay

## 2021-02-10 NOTE — Telephone Encounter (Signed)
Called patient to reschedule the appointment that he has scheduled with Dr.B on  June 16. No answer and mailbox is full.

## 2021-03-11 ENCOUNTER — Ambulatory Visit: Payer: Medicare Other | Admitting: Family Medicine

## 2021-03-22 DIAGNOSIS — E7211 Homocystinuria: Secondary | ICD-10-CM | POA: Diagnosis not present

## 2021-03-22 DIAGNOSIS — E039 Hypothyroidism, unspecified: Secondary | ICD-10-CM | POA: Diagnosis not present

## 2021-03-22 DIAGNOSIS — E559 Vitamin D deficiency, unspecified: Secondary | ICD-10-CM | POA: Diagnosis not present

## 2021-03-22 DIAGNOSIS — E291 Testicular hypofunction: Secondary | ICD-10-CM | POA: Diagnosis not present

## 2021-04-20 ENCOUNTER — Other Ambulatory Visit: Payer: Self-pay | Admitting: Family Medicine

## 2021-04-20 DIAGNOSIS — E039 Hypothyroidism, unspecified: Secondary | ICD-10-CM

## 2021-05-10 ENCOUNTER — Ambulatory Visit (INDEPENDENT_AMBULATORY_CARE_PROVIDER_SITE_OTHER): Payer: Medicare Other | Admitting: Family Medicine

## 2021-05-10 ENCOUNTER — Encounter: Payer: Self-pay | Admitting: Family Medicine

## 2021-05-10 ENCOUNTER — Other Ambulatory Visit: Payer: Self-pay

## 2021-05-10 VITALS — BP 137/74 | HR 61 | Temp 98.1°F | Resp 16 | Ht 71.0 in | Wt 188.0 lb

## 2021-05-10 DIAGNOSIS — E663 Overweight: Secondary | ICD-10-CM

## 2021-05-10 DIAGNOSIS — R739 Hyperglycemia, unspecified: Secondary | ICD-10-CM | POA: Diagnosis not present

## 2021-05-10 DIAGNOSIS — N1831 Chronic kidney disease, stage 3a: Secondary | ICD-10-CM | POA: Insufficient documentation

## 2021-05-10 DIAGNOSIS — E785 Hyperlipidemia, unspecified: Secondary | ICD-10-CM

## 2021-05-10 DIAGNOSIS — E039 Hypothyroidism, unspecified: Secondary | ICD-10-CM

## 2021-05-10 DIAGNOSIS — I10 Essential (primary) hypertension: Secondary | ICD-10-CM | POA: Diagnosis not present

## 2021-05-10 NOTE — Assessment & Plan Note (Signed)
Discussed importance of healthy weight management Discussed diet and exercise  

## 2021-05-10 NOTE — Progress Notes (Signed)
Established patient visit   Patient: Gregory Mckay   DOB: 07-25-1946   75 y.o. Male  MRN: [id number] Visit Date: 05/10/2021  Today's healthcare provider: Lavon Paganini, MD   Chief Complaint  Patient presents with   Hypertension   Hypothyroidism   Hyperlipidemia   Subjective    Hypertension Pertinent negatives include no chest pain, headaches, neck pain, palpitations or shortness of breath.  Hyperlipidemia Pertinent negatives include no chest pain, myalgias or shortness of breath.  Hypertension, follow-up  BP Readings from Last 3 Encounters:  05/10/21 137/74  09/10/20 120/75  03/03/20 122/70   Wt Readings from Last 3 Encounters:  05/10/21 188 lb (85.3 kg)  09/10/20 200 lb (90.7 kg)  03/03/20 202 lb (91.6 kg)     He was last seen for hypertension 8 months ago.  BP at that visit was 120/75. Management since that visit includes no changes. He reports excellent compliance with treatment. He is not having side effects. He is exercising. He is adherent to low salt diet.   Outside blood pressures are not being checked.  He does not smoke.  Use of agents associated with hypertension: none.   --------------------------------------------------------------------------------------------------- Lipid/Cholesterol, follow-up  Last Lipid Panel: Lab Results  Component Value Date   CHOL 126 09/10/2020   LDLCALC 70 09/10/2020   HDL 24 (L) 09/10/2020   TRIG 191 (H) 09/10/2020    He was last seen for this 8 months ago.  Management since that visit includes no changes.  He reports excellent compliance with treatment. He is not having side effects.   Symptoms: No appetite changes No foot ulcerations  No chest pain No chest pressure/discomfort  No dyspnea No orthopnea  No fatigue No lower extremity edema  No palpitations No paroxysmal nocturnal dyspnea  No nausea No numbness or tingling of extremity  No polydipsia No polyuria  No speech difficulty No syncope    He is following a Low Sodium diet. Current exercise: weightlifting  Last metabolic panel Lab Results  Component Value Date   GLUCOSE 100 (H) 09/10/2020   NA 142 09/10/2020   K 4.8 09/10/2020   BUN 24 09/10/2020   CREATININE 1.39 (H) 09/10/2020   GFRNONAA 50 (L) 09/10/2020   GFRAA 58 (L) 09/10/2020   CALCIUM 10.1 09/10/2020   AST 13 09/10/2020   ALT 16 09/10/2020   The ASCVD Risk score (Goff DC Jr., et al., 2013) failed to calculate for the following reasons:   The valid total cholesterol range is 130 to 320 mg/dL  --------------------------------------------------------------------------------------------------- Hypothyroid, follow-up  Lab Results  Component Value Date   TSH 1.880 09/10/2020   TSH 2.200 03/03/2020   TSH 2.260 09/12/2019   Wt Readings from Last 3 Encounters:  05/10/21 188 lb (85.3 kg)  09/10/20 200 lb (90.7 kg)  03/03/20 202 lb (91.6 kg)    He was last seen for hypothyroid 8 months ago.  Management since that visit includes no changes. He reports excellent compliance with treatment. He is not having side effects.   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 Taking at 1:00 am   Coenzyme Q10 (COQ-10) 50 MG CAPS Take 50 mg by mouth daily. Taking at 99991111 am.   GARLIC PO Take 0000000 mg by mouth 2 (two) times daily. Aged Garlic Extract. Taking at 2:00 am and 2:00 pm   Gymnema Sylvestris Leaf POWD Take 400 mg by mouth 2 (two) times daily. Taking at 1:00 am and 1:00 pm   Levomefolate Glucosamine (METHYLFOLATE PO) Take 1,000 mcg by mouth daily. Taking at 1:00 am.   milk thistle 175 MG tablet Take 175 mg by mouth 2 (two) times daily. Taking at 1:00 am and 1:00 pm   Misc Natural Products (BETA-SITOSTEROL PLANT STEROLS) CAPS Take 60 mg by mouth 2 (two) times daily. Taking at 3:00 and 3:00 pm   OVER THE COUNTER MEDICATION Take 750 mg by mouth 2 (two) times daily. Curamed. Taking 1 tablet at 1:00 am, and 1 tablet at 1:00 pm.   PHOSPHATIDYLSERINE PO Take 300 mg by mouth daily. Taking at 1:00 am   RESVERATROL PO Take 500 mg by mouth daily. Taking at 1:00 am.   tadalafil (CIALIS) 5 MG tablet TAKE ONE TABLET EVERY DAY   testosterone (ANDROGEL) 50 MG/5GM (1%) GEL Place 5 g onto the skin daily.   triamcinolone ointment (KENALOG) 0.5 % Apply 1 application topically 2 (two) times daily.   vitamin E 400 UNIT capsule Take 400 Units by mouth daily. Taking at 1:00 am   Vitamin Mixture (ESTER-C PO) Take 1,000 mg by mouth daily. Taking at 1:00 am   Biotin 5000 MCG CAPS Take 1 capsule by mouth daily. Taking at 1:00 am (Patient not taking: Reported on 05/10/2021)   rosuvastatin (CRESTOR) 5 MG tablet Take 1 tablet (5 mg total) by mouth daily.   [DISCONTINUED] doxycycline (VIBRA-TABS) 100 MG tablet Take on tab po BID x 10 days. Take with food. (Patient not taking: Reported on 05/10/2021)   No facility-administered medications prior to visit.    Review of Systems  Constitutional:  Negative for activity  change, appetite change, chills, fatigue, fever and unexpected weight change.  HENT:  Negative for ear pain, sinus pressure, sinus pain and sore throat.   Eyes:  Negative for pain and visual disturbance.  Respiratory:  Negative for cough, chest tightness, shortness of breath and wheezing.   Cardiovascular:  Negative for chest pain, palpitations and leg swelling.  Gastrointestinal:  Negative for abdominal pain, blood in stool, diarrhea, nausea and vomiting.  Genitourinary:  Negative for flank pain, frequency and urgency.  Musculoskeletal:  Negative for back pain, myalgias and neck pain.  Neurological:  Negative for dizziness, weakness, light-headedness and headaches.       Objective    BP 137/74 (BP Location: Left Arm, Patient Position: Sitting, Cuff Size: Large)   Pulse 61   Temp 98.1 F (36.7 C) (Oral)   Resp 16   Ht '5\' 11"'$  (1.803 m)   Wt 188 lb (85.3 kg)   BMI 26.22 kg/m  BP Readings from Last 3 Encounters:  05/10/21 137/74  09/10/20 120/75  03/03/20 122/70   Wt Readings from Last 3 Encounters:  05/10/21 188 lb (85.3 kg)  09/10/20 200 lb (90.7 kg)  03/03/20 202 lb (91.6 kg)     Physical Exam Vitals reviewed.  Constitutional:      General: He is not in acute distress.    Appearance: Normal appearance. He is not diaphoretic.  HENT:     Head: Normocephalic and atraumatic.  Eyes:     General: No scleral icterus.    Conjunctiva/sclera: Conjunctivae normal.  Cardiovascular:     Rate and Rhythm: Normal rate and regular rhythm.     Pulses: Normal pulses.     Heart sounds: Normal heart sounds. No murmur heard. Pulmonary:     Effort: Pulmonary effort is normal. No respiratory distress.     Breath sounds: Normal breath sounds. No wheezing or rhonchi.  Abdominal:     General: There is no distension.     Palpations: Abdomen is soft.     Tenderness: There is no abdominal tenderness.  Musculoskeletal:     Cervical back: Neck supple.     Right lower leg: No edema.     Left  lower leg: No edema.  Lymphadenopathy:     Cervical: No cervical adenopathy.  Skin:    General: Skin is warm and dry.     Capillary Refill: Capillary refill takes less than 2 seconds.     Findings: No rash.  Neurological:     Mental Status: He is alert and oriented to person, place, and time.     Cranial Nerves: No cranial nerve deficit.  Psychiatric:        Mood and Affect: Mood normal.        Behavior: Behavior normal.     No results found for any visits on 05/10/21.  Assessment & Plan     Problem List Items Addressed This Visit       Cardiovascular and Mediastinum   Hypertension    Well controlled Continue current medications Recheck metabolic panel F/u in 6 months       Relevant Orders   Basic metabolic panel     Endocrine   Hypothyroidism - Primary    Previously well controlled Continue Synthroid at current dose  Recheck TSH and adjust Synthroid as indicated        Relevant Orders   TSH     Genitourinary   Stage 3a chronic kidney disease ([facility])    Chronic and stable Recheck metabolic panel Avoid nephrotoxic meds       Relevant Orders   Basic metabolic panel     Other   Hyperlipidemia    Well controlled on last check No changes today Recheck at next visit      Overweight    Discussed importance of healthy weight management Discussed diet and exercise       Other Visit Diagnoses     Hyperglycemia       Relevant Orders   Hemoglobin A1c       Return in about 6 months (around 11/10/2021) for AWV, CPE.      I,Essence Turner,acting as a scribe for Lavon Paganini, MD.,have documented all relevant documentation on the behalf of Lavon Paganini, MD,as directed by  Lavon Paganini, MD while in the presence of Lavon Paganini, MD.  I, Lavon Paganini, MD, have reviewed all documentation for this visit. The documentation on 05/10/21 for the exam, diagnosis, procedures, and orders  are all accurate and complete.   Jasaun Carn, Dionne Bucy, MD, MPH [facility]

## 2021-05-10 NOTE — Assessment & Plan Note (Signed)
Well controlled Continue current medications Recheck metabolic panel F/u in 6 months  

## 2021-05-10 NOTE — Assessment & Plan Note (Signed)
Chronic and stable Recheck metabolic panel Avoid nephrotoxic meds  

## 2021-05-10 NOTE — Assessment & Plan Note (Signed)
Well controlled on last check No changes today Recheck at next visit

## 2021-05-10 NOTE — Assessment & Plan Note (Signed)
Previously well controlled Continue Synthroid at current dose  Recheck TSH and adjust Synthroid as indicated   

## 2021-05-11 LAB — BASIC METABOLIC PANEL
BUN/Creatinine Ratio: 13 (ref 10–24)
BUN: 15 mg/dL (ref 8–27)
CO2: 21 mmol/L (ref 20–29)
Calcium: 9.6 mg/dL (ref 8.6–10.2)
Chloride: 104 mmol/L (ref 96–106)
Creatinine, Ser: 1.17 mg/dL (ref 0.76–1.27)
Glucose: 96 mg/dL (ref 65–99)
Potassium: 4.3 mmol/L (ref 3.5–5.2)
Sodium: 142 mmol/L (ref 134–144)
eGFR: 65 mL/min/{1.73_m2} (ref >59)

## 2021-05-11 LAB — HEMOGLOBIN A1C
Est. average glucose Bld gHb Est-mCnc: 117 mg/dL
Hgb A1c MFr Bld: 5.7 % — ABNORMAL HIGH (ref 4.8–5.6)

## 2021-05-11 LAB — TSH: TSH: 2.3 u[IU]/mL (ref 0.450–4.500)

## 2021-06-11 DIAGNOSIS — L0501 Pilonidal cyst with abscess: Secondary | ICD-10-CM | POA: Diagnosis not present

## 2021-06-16 ENCOUNTER — Other Ambulatory Visit: Payer: Self-pay

## 2021-06-16 ENCOUNTER — Ambulatory Visit (INDEPENDENT_AMBULATORY_CARE_PROVIDER_SITE_OTHER): Payer: Medicare Other

## 2021-06-16 DIAGNOSIS — Z23 Encounter for immunization: Secondary | ICD-10-CM | POA: Diagnosis not present

## 2021-07-22 DIAGNOSIS — H524 Presbyopia: Secondary | ICD-10-CM | POA: Diagnosis not present

## 2021-07-22 DIAGNOSIS — Z0100 Encounter for examination of eyes and vision without abnormal findings: Secondary | ICD-10-CM | POA: Diagnosis not present

## 2021-09-09 ENCOUNTER — Other Ambulatory Visit: Payer: Self-pay | Admitting: Family Medicine

## 2021-09-09 DIAGNOSIS — E039 Hypothyroidism, unspecified: Secondary | ICD-10-CM

## 2021-10-06 ENCOUNTER — Ambulatory Visit: Payer: Medicare Other | Admitting: Dermatology

## 2021-10-06 ENCOUNTER — Telehealth: Payer: Self-pay

## 2021-10-06 ENCOUNTER — Encounter: Payer: Self-pay | Admitting: Dermatology

## 2021-10-06 ENCOUNTER — Other Ambulatory Visit: Payer: Self-pay

## 2021-10-06 DIAGNOSIS — L738 Other specified follicular disorders: Secondary | ICD-10-CM

## 2021-10-06 MED ORDER — TRETINOIN 0.1 % EX CREA: TOPICAL_CREAM | CUTANEOUS | 2 refills | Status: DC

## 2021-10-06 NOTE — Telephone Encounter (Signed)
Patient has called back this afternoon from office visit this morning. He has declined the treatment he was originally scheduled for and would like to do accutane at this time.

## 2021-10-06 NOTE — Progress Notes (Signed)
° °  Follow-Up Visit   Subjective  Gregory Mckay is a 76 y.o. male who presents for the following: Skin Problem (Patient c/o excessive oil on face. Has to wash face twice daily. Dur: several years. Would like to discuss Accutane).    The following portions of the chart were reviewed this encounter and updated as appropriate:      Review of Systems: No other skin or systemic complaints except as noted in HPI or Assessment and Plan.   Objective  Well appearing patient in no apparent distress; mood and affect are within normal limits.  A focused examination was performed including face, hands. Relevant physical exam findings are noted in the Assessment and Plan.  cheeks, forehead Multiple large yellowish lobulated papules   Assessment & Plan  Sebaceous hyperplasia cheeks, forehead  With excessive oil production/oily skin  Benign, observe.    Discussed ED to larger oil glands. Insurance will not cover treatment of these lesions. Discussed cosmetic procedure, noncovered.  $60 for 1st lesion and $15 for each additional lesion if done on the same day.  Maximum charge $350.  One touch-up treatment included no charge. Discussed risks of treatment including dyspigmentation, small scar, and/or recurrence. Recommend daily broad spectrum sunscreen SPF 30+/photoprotection to treated areas once healed.   Oily skin OTC leave-on products include OC8 mattifying gel or Cetaphil DermaControl Oil Absorbing moisturizer spf 30.  For cleansers, recommend Epionce Lytic Cleanser or Cetaphil DermaControl Oil removing Foam wash.  May also try OTC acne cleansers formulated with Salicylic acid or Benzoyl Peroxide.   Start topical tretinoin 0.1% cream to face at bedtime. Start out 3 nights per week, gradually increase to every night as tolerated.  Discussed oral isotretinoin, but may not be covered by insurance and risk potential side effects.  Topical retinoid medications like tretinoin/Retin-A,  adapalene/Differin, tazarotene/Fabior, and Epiduo/Epiduo Forte can cause dryness and irritation when first started. Only apply a pea-sized amount to the entire affected area. Avoid applying it around the eyes, edges of mouth and creases at the nose. If you experience irritation, use a good moisturizer first and/or apply the medicine less often. If you are doing well with the medicine, you can increase how often you use it until you are applying every night. Be careful with sun protection while using this medication as it can make you sensitive to the sun. This medicine should not be used by pregnant women.     tretinoin (RETIN-A) 0.1 % cream - cheeks, forehead Apply to face qhs, wash off qam   Return for [facility], Monday afternoon surgery appointment (30 min).  I, Emelia Salisbury, CMA, am acting as scribe for Brendolyn Patty, MD.  Documentation: I have reviewed the above documentation for accuracy and completeness, and I agree with the above.  Brendolyn Patty MD

## 2021-10-06 NOTE — Patient Instructions (Addendum)
Recommend using Cetaphil oil control wash and moisturizer with sunscreen.   Can try Epionce lytic gel cleanser, can be ordered online, or OC8 Professional Mattifying Gel.  Recommend using benzoyl peroxide wash once or twice daily as tolerated.  Start Tretinoin 0.1% cream at bedtime. Start out 3 nights per week, gradually increase to every night as tolerated. Do not use a leave on benzoyl peroxide topical with tretinoin at bedtime.  Topical retinoid medications like tretinoin/Retin-A, adapalene/Differin, tazarotene/Fabior, and Epiduo/Epiduo Forte can cause dryness and irritation when first started. Only apply a pea-sized amount to the entire affected area. Avoid applying it around the eyes, edges of mouth and creases at the nose. If you experience irritation, use a good moisturizer first and/or apply the medicine less often. If you are doing well with the medicine, you can increase how often you use it until you are applying every night. Be careful with sun protection while using this medication as it can make you sensitive to the sun. This medicine should not be used by pregnant women.     If You Need Anything After Your Visit  If you have any questions or concerns for your doctor, please call our main line at [phone] and press option 4 to reach your doctor's medical assistant. If no one answers, please leave a voicemail as directed and we will return your call as soon as possible. Messages left after 4 pm will be answered the following business day.   You may also send [place] a message via [place]. We typically respond to MyChart messages within 1-2 business days.  For prescription refills, please ask your pharmacy to contact our office. Our fax number is [phone].  If you have an urgent issue when the clinic is closed that cannot wait until the next business day, you can page your doctor at the number below.    Please note that while we do our best to be available for urgent issues  outside of office hours, we are not available 24/7.   If you have an urgent issue and are unable to reach [place], you may choose to seek medical care at your doctor's office, retail clinic, urgent care center, or emergency room.  If you have a medical emergency, please immediately call 911 or go to the emergency department.  Pager Numbers  - Dr. Nehemiah Massed: [phone]  - Dr. Laurence Ferrari: [phone]  - Dr. Nicole Kindred: [phone]  In the event of inclement weather, please call our main line at [phone] for an update on the status of any delays or closures.  Dermatology Medication Tips: Please keep the boxes that topical medications come in in order to help keep track of the instructions about where and how to use these. Pharmacies typically print the medication instructions only on the boxes and not directly on the medication tubes.   If your medication is too expensive, please contact our office at [phone] option 4 or send [place] a message through [place].   We are unable to tell what your co-pay for medications will be in advance as this is different depending on your insurance coverage. However, we may be able to find a substitute medication at lower cost or fill out paperwork to get insurance to cover a needed medication.   If a prior authorization is required to get your medication covered by your insurance company, please allow [place] 1-2 business days to complete this process.  Drug prices often vary depending on where the prescription is filled and some pharmacies may offer  cheaper prices.  The website www.goodrx.com contains coupons for medications through different pharmacies. The prices here do not account for what the cost may be with help from insurance (it may be cheaper with your insurance), but the website can give you the price if you did not use any insurance.  - You can print the associated coupon and take it with your prescription to the pharmacy.  - You may also stop by our  office during regular business hours and pick up a GoodRx coupon card.  - If you need your prescription sent electronically to a different pharmacy, notify our office through [facility] or by phone at [phone] option 4.     Si Usted Necesita Algo Despus de Su Visita  Tambin puede enviarnos un mensaje a travs de Pharmacist, community. Por lo general respondemos a los mensajes de MyChart en el transcurso de 1 a 2 das hbiles.  Para renovar recetas, por favor pida a su farmacia que se ponga en contacto con nuestra oficina. Harland Dingwall de fax es [place] [phone].  Si tiene un asunto urgente cuando la clnica est cerrada y que no puede esperar hasta el siguiente da hbil, puede llamar/localizar a su doctor(a) al nmero que aparece a continuacin.   Por favor, tenga en cuenta que aunque hacemos todo lo posible para estar disponibles para asuntos urgentes fuera del horario de [place], no estamos disponibles las 24 horas del da, los 7 das de la [place].   Si tiene un problema urgente y no puede comunicarse con nosotros, puede optar por buscar atencin mdica  en el consultorio de su doctor(a), en una clnica privada, en un centro de atencin urgente o en una sala de emergencias.  Si tiene Engineering geologist, por favor llame inmediatamente al 911 o vaya a la sala de emergencias.  Nmeros de bper  - Dr. Nehemiah Massed: [phone]  - Dra. Moye: [phone]  - Dra. Nicole Kindred: [phone]  En caso de inclemencias del [place], por favor llame a Johnsie Kindred principal al [phone] para una actualizacin sobre el [place] de cualquier retraso o cierre.  Consejos para la medicacin en dermatologa: Por favor, guarde las cajas en las que vienen los medicamentos de uso tpico para ayudarle a seguir las instrucciones sobre dnde y cmo usarlos. Las farmacias generalmente imprimen las instrucciones del medicamento slo en las cajas y no directamente en los tubos del [place].   Si su  medicamento es muy caro, por favor, pngase en contacto con Zigmund Daniel llamando al [phone] y presione la opcin 4 o envenos un mensaje a travs de Pharmacist, community.   No podemos decirle cul ser su copago por los medicamentos por adelantado ya que esto es diferente dependiendo de la cobertura de su seguro. Sin embargo, es posible que podamos encontrar un medicamento sustituto a Electrical engineer un formulario para que el seguro cubra el medicamento que se considera necesario.   Si se requiere una autorizacin previa para que su compaa de seguros [place] su medicamento, por favor permtanos de 1 a 2 das hbiles para completar este proceso.  Los precios de los medicamentos varan con frecuencia dependiendo del Environmental consultant de dnde se surte la receta y alguna farmacias pueden ofrecer precios ms baratos.  El sitio web www.goodrx.com tiene cupones para medicamentos de Airline pilot. Los precios aqu no tienen en cuenta lo que podra costar con la ayuda del seguro (puede ser ms barato con su seguro), pero el sitio web puede darle el precio si no utiliz ningn  seguro.  - Puede imprimir el cupn correspondiente y llevarlo con su receta a la farmacia.  - Tambin puede pasar por nuestra oficina durante el horario de atencin regular y Charity fundraiser una tarjeta de cupones de GoodRx.  - Si necesita que su receta se enve electrnicamente a una farmacia diferente, informe a nuestra oficina a travs de MyChart de [place] o por telfono llamando al [phone] y presione la opcin 4.

## 2021-10-06 NOTE — Telephone Encounter (Signed)
Patient advised of information. aw 

## 2021-10-18 ENCOUNTER — Ambulatory Visit: Payer: Self-pay | Admitting: Dermatology

## 2021-10-18 ENCOUNTER — Other Ambulatory Visit: Payer: Self-pay

## 2021-10-18 DIAGNOSIS — L738 Other specified follicular disorders: Secondary | ICD-10-CM

## 2021-10-18 NOTE — Patient Instructions (Addendum)
Aftercare  Wash gently with soap and water everyday.   Apply Vaseline daily until healed.     If You Need Anything After Your Visit  If you have any questions or concerns for your doctor, please call our main line at [phone] and press option 4 to reach your doctor's medical assistant. If no one answers, please leave a voicemail as directed and we will return your call as soon as possible. Messages left after 4 pm will be answered the following business day.   You may also send [place] a message via [place]. We typically respond to MyChart messages within 1-2 business days.  For prescription refills, please ask your pharmacy to contact our office. Our fax number is [phone].  If you have an urgent issue when the clinic is closed that cannot wait until the next business day, you can page your doctor at the number below.    Please note that while we do our best to be available for urgent issues outside of office hours, we are not available 24/7.   If you have an urgent issue and are unable to reach [place], you may choose to seek medical care at your doctor's office, retail clinic, urgent care center, or emergency room.  If you have a medical emergency, please immediately call 911 or go to the emergency department.  Pager Numbers  - Dr. Nehemiah Massed: [phone]  - Dr. Laurence Ferrari: [phone]  - Dr. Nicole Kindred: [phone]  In the event of inclement weather, please call our main line at [phone] for an update on the status of any delays or closures.  Dermatology Medication Tips: Please keep the boxes that topical medications come in in order to help keep track of the instructions about where and how to use these. Pharmacies typically print the medication instructions only on the boxes and not directly on the medication tubes.   If your medication is too expensive, please contact our office at [phone] option 4 or send [place] a message through [place].   We are unable to tell what your  co-pay for medications will be in advance as this is different depending on your insurance coverage. However, we may be able to find a substitute medication at lower cost or fill out paperwork to get insurance to cover a needed medication.   If a prior authorization is required to get your medication covered by your insurance company, please allow [place] 1-2 business days to complete this process.  Drug prices often vary depending on where the prescription is filled and some pharmacies may offer cheaper prices.  The website www.goodrx.com contains coupons for medications through different pharmacies. The prices here do not account for what the cost may be with help from insurance (it may be cheaper with your insurance), but the website can give you the price if you did not use any insurance.  - You can print the associated coupon and take it with your prescription to the pharmacy.  - You may also stop by our office during regular business hours and pick up a GoodRx coupon card.  - If you need your prescription sent electronically to a different pharmacy, notify our office through [facility] or by phone at [phone] option 4.     Si Usted Necesita Algo Despus de Su Visita  Tambin puede enviarnos un mensaje a travs de Pharmacist, community. Por lo general respondemos a los mensajes de MyChart en el transcurso de 1 a 2 das hbiles.  Para renovar recetas, por favor pida a  su farmacia que se ponga en contacto con nuestra oficina. Harland Dingwall de fax es [place] [phone].  Si tiene un asunto urgente cuando la clnica est cerrada y que no puede esperar hasta el siguiente da hbil, puede llamar/localizar a su doctor(a) al nmero que aparece a continuacin.   Por favor, tenga en cuenta que aunque hacemos todo lo posible para estar disponibles para asuntos urgentes fuera del horario de [place], no estamos disponibles las 24 horas del da, los 7 das de la [place].   Si tiene un problema urgente y no  puede comunicarse con nosotros, puede optar por buscar atencin mdica  en el consultorio de su doctor(a), en una clnica privada, en un centro de atencin urgente o en una sala de emergencias.  Si tiene Engineering geologist, por favor llame inmediatamente al 911 o vaya a la sala de emergencias.  Nmeros de bper  - Dr. Nehemiah Massed: [phone]  - Dra. Moye: [phone]  - Dra. Nicole Kindred: [phone]  En caso de inclemencias del [place], por favor llame a Johnsie Kindred principal al [phone] para una actualizacin sobre el [place] de cualquier retraso o cierre.  Consejos para la medicacin en dermatologa: Por favor, guarde las cajas en las que vienen los medicamentos de uso tpico para ayudarle a seguir las instrucciones sobre dnde y cmo usarlos. Las farmacias generalmente imprimen las instrucciones del medicamento slo en las cajas y no directamente en los tubos del [place].   Si su medicamento es muy caro, por favor, pngase en contacto con Zigmund Daniel llamando al [phone] y presione la opcin 4 o envenos un mensaje a travs de Pharmacist, community.   No podemos decirle cul ser su copago por los medicamentos por adelantado ya que esto es diferente dependiendo de la cobertura de su seguro. Sin embargo, es posible que podamos encontrar un medicamento sustituto a Electrical engineer un formulario para que el seguro cubra el medicamento que se considera necesario.   Si se requiere una autorizacin previa para que su compaa de seguros [place] su medicamento, por favor permtanos de 1 a 2 das hbiles para completar este proceso.  Los precios de los medicamentos varan con frecuencia dependiendo del Environmental consultant de dnde se surte la receta y alguna farmacias pueden ofrecer precios ms baratos.  El sitio web www.goodrx.com tiene cupones para medicamentos de Airline pilot. Los precios aqu no tienen en cuenta lo que podra costar con la ayuda del seguro (puede ser ms barato con su seguro),  pero el sitio web puede darle el precio si no utiliz Research scientist (physical sciences).  - Puede imprimir el cupn correspondiente y llevarlo con su receta a la farmacia.  - Tambin puede pasar por nuestra oficina durante el horario de atencin regular y Charity fundraiser una tarjeta de cupones de GoodRx.  - Si necesita que su receta se enve electrnicamente a una farmacia diferente, informe a nuestra oficina a travs de MyChart de [place] o por telfono llamando al [phone] y presione la opcin 4.

## 2021-10-18 NOTE — Progress Notes (Signed)
° °  Follow-Up Visit   Subjective  Gregory Mckay is a 76 y.o. male who presents for the following: Sebaceous Hyperplasia.  Patient has sebaceous hyperplasia of the face with excessive oil production/oily skin. He has decided not to start isotretinoin due to side effects. He has improved some using tretinoin 0.1% cream at night. He would like to have cosmetic ED treatment to a few areas of the face today.   The following portions of the chart were reviewed this encounter and updated as appropriate:       Review of Systems:  No other skin or systemic complaints except as noted in HPI or Assessment and Plan.  Objective  Well appearing patient in no apparent distress; mood and affect are within normal limits.  A focused examination was performed including face. Relevant physical exam findings are noted in the Assessment and Plan.  R cheek x 16, L malar cheek x 4, glabella x 7 (27) Multiple large yellowish lobulated papules           Assessment & Plan  Sebaceous hyperplasia R cheek x 16, L malar cheek x 4, glabella x 7  With excessive oil production/oily skin   Discussed cosmetic procedure, noncovered.  $60 for 1st lesion and $15 for each additional lesion if done on the same day.  Maximum charge $350.  One touch-up treatment included no charge. Discussed risks of treatment including dyspigmentation, small scar, and/or recurrence. Recommend daily broad spectrum sunscreen SPF 30+/photoprotection to treated areas once healed.  Electrodesiccation performed today x 27 to left cheek, right cheek, and forehead. Non-covered form signed.   Once healed, continue tretinoin 0.1% cream qhs as tolerated.  HydroBoost, Cetaphil Gel - samples given.   Topical retinoid medications like tretinoin/Retin-A, adapalene/Differin, tazarotene/Fabior, and Epiduo/Epiduo Forte can cause dryness and irritation when first started. Only apply a pea-sized amount to the entire affected area. Avoid applying it  around the eyes, edges of mouth and creases at the nose. If you experience irritation, use a good moisturizer first and/or apply the medicine less often. If you are doing well with the medicine, you can increase how often you use it until you are applying every night. Be careful with sun protection while using this medication as it can make you sensitive to the sun. This medicine should not be used by pregnant women.    Destruction of lesion - R cheek x 16, L malar cheek x 4, glabella x 7  Destruction method: electrodesiccation and curettage   Destruction method comment:  Electrodesiccation only Informed consent: discussed and consent obtained   Timeout:  patient name, date of birth, surgical site, and procedure verified Patient was prepped and draped in usual sterile fashion: patient was prepped with isopropyl alcohol. Outcome: patient tolerated procedure well with no complications   Post-procedure details comment:  Wound care instructions given  Related Medications tretinoin (RETIN-A) 0.1 % cream Apply to face qhs, wash off qam   Return in about 2 months (around 12/16/2021) for [facility] recheck post ED.  IJamesetta Orleans, CMA, am acting as scribe for Brendolyn Patty, MD .  Documentation: I have reviewed the above documentation for accuracy and completeness, and I agree with the above.  Brendolyn Patty MD

## 2021-10-25 ENCOUNTER — Other Ambulatory Visit: Payer: Self-pay | Admitting: Family Medicine

## 2021-10-25 DIAGNOSIS — E039 Hypothyroidism, unspecified: Secondary | ICD-10-CM

## 2021-10-25 NOTE — Telephone Encounter (Signed)
Requested by interface surescripts. Last refill 09/10/21 #90 . Request too soon per protocol Requested Prescriptions  Refused Prescriptions Disp Refills   ARMOUR THYROID 60 MG tablet [Pharmacy Med Name: ARMOUR THYROID 60 MG TAB] 90 tablet 0    Sig: TAKE ONE TABLET EVERY DAY BEFORE BREAKFAST     Endocrinology:  Hypothyroid Agents Failed - 10/25/2021  8:27 AM      Failed - TSH needs to be rechecked within 3 months after an abnormal result. Refill until TSH is due.      Passed - TSH in normal range and within 360 days    TSH  Date Value Ref Range Status  05/10/2021 2.300 0.450 - 4.500 uIU/mL Final         Passed - Valid encounter within last 12 months    Recent Outpatient Visits          5 months ago Hypothyroidism, unspecified type   [place], Dionne Bucy, MD   1 year ago Encounter for annual physical exam   [facility] [place], Dionne Bucy, MD   1 year ago Essential hypertension   [place], Dionne Bucy, MD   2 years ago Encounter for annual physical exam   [facility] [place], Dionne Bucy, MD   2 years ago Lumbar radiculopathy   [place], [place], [place]

## 2021-10-27 ENCOUNTER — Other Ambulatory Visit: Payer: Self-pay | Admitting: Family Medicine

## 2021-10-27 DIAGNOSIS — E039 Hypothyroidism, unspecified: Secondary | ICD-10-CM

## 2021-10-27 NOTE — Telephone Encounter (Signed)
Medication Refill - Medication: testosterone (ANDROGEL) 50 MG/5GM (1%) GEL  Has the patient contacted their pharmacy? Yes.    (Agent: If yes, when and what did the pharmacy advise?) Contact PCP office because several request was sent to the practice already.   Preferred Pharmacy (with phone number or street name):  [place], [place] - [place] Phone:  [phone]  Fax:  [phone]      Has the patient been seen for an appointment in the last year OR does the patient have an upcoming appointment? Yes.   Patient AWV/CPE has to be [facility] due to provider  Agent: Please be advised that RX refills may take up to 3 business days. We ask that you follow-up with your pharmacy.

## 2021-10-27 NOTE — Telephone Encounter (Signed)
Patient unsure why ARMOUR THYROID 60 MG tablet was denied and will run out shortly. Patient requesting script be sent in today.   [place], [place] Phone:  [phone]  Fax:  [phone]

## 2021-10-27 NOTE — Telephone Encounter (Signed)
° °  Requested medication (s) are on the active medication list: by historical provider   Future visit scheduled: 01/11/22  Notes to clinic:  Historical Provider, please assess.  Requested Prescriptions  Pending Prescriptions Disp Refills   testosterone (ANDROGEL) 50 MG/5GM (1%) GEL      Sig: Place 5 g onto the skin daily.     Off-Protocol Failed - 10/27/2021  2:20 PM      Failed - Medication not assigned to a protocol, review manually.      Passed - Valid encounter within last 12 months    Recent Outpatient Visits           5 months ago Hypothyroidism, unspecified type   [facility] [place], Dionne Bucy, MD   1 year ago Encounter for annual physical exam   [facility] [place], Dionne Bucy, MD   1 year ago Essential hypertension   [place], Dionne Bucy, MD   2 years ago Encounter for annual physical exam   [facility] [place], Dionne Bucy, MD   2 years ago Lumbar radiculopathy   [place], [place], [place]

## 2021-10-29 NOTE — Telephone Encounter (Signed)
This is not a med we Rx. I think he sees Urology.

## 2021-10-29 NOTE — Telephone Encounter (Signed)
Pt advised.  Stated he realized that he has requested from the wrong doctor.

## 2021-10-29 NOTE — Telephone Encounter (Signed)
Per Anderson Malta okay to route to Dr. Brita Romp and make it high priority.   Patient calling back stating he is out of testosterone (ANDROGEL) 50 MG/5GM (1%) GEL and would like PCP to sent in script today before the weekend. Patient requesting a call back today when script has been sent.   [place], [place] Phone:  [phone]  Fax:  [phone]

## 2021-11-15 DIAGNOSIS — H524 Presbyopia: Secondary | ICD-10-CM | POA: Diagnosis not present

## 2021-12-22 ENCOUNTER — Other Ambulatory Visit: Payer: Self-pay

## 2021-12-22 ENCOUNTER — Ambulatory Visit: Payer: Medicare Other | Admitting: Dermatology

## 2021-12-22 DIAGNOSIS — L738 Other specified follicular disorders: Secondary | ICD-10-CM

## 2021-12-22 DIAGNOSIS — L578 Other skin changes due to chronic exposure to nonionizing radiation: Secondary | ICD-10-CM | POA: Diagnosis not present

## 2021-12-22 DIAGNOSIS — L821 Other seborrheic keratosis: Secondary | ICD-10-CM | POA: Diagnosis not present

## 2021-12-22 NOTE — Progress Notes (Signed)
? ?Follow-Up Visit ?  ?Subjective  ?Gregory Mckay is a 76 y.o. male who presents for the following: Follow-up (Patient here today for follow up on sebaceous hyperplasia at face. Patient was treated at last visit with electrodesiccation unwanted areas at face. Patient reports he is please with results. He reports some spots at bilateral side he would like checked and treated. ). ? ?The patient has spots, moles and lesions to be evaluated, some may be new or changing and the patient has concerns that these could be cancer. ? ?The following portions of the chart were reviewed this encounter and updated as appropriate:   ?  ? ?Review of Systems: No other skin or systemic complaints except as noted in HPI or Assessment and Plan. ? ? ?Objective  ?Well appearing patient in no apparent distress; mood and affect are within normal limits. ? ?A focused examination was performed including face, bilateral flanks. Relevant physical exam findings are noted in the Assessment and Plan. ? ?7 on right cheek , 1 forehead, 2 left cheek (10) ?7 yellow papules on right cheek , 1 on mid forehead, 2 on left cheek  ? ? 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 ?Has improved  ? ?With excessive oil production/oily skin ? ?Free touch up done to residual areas from prior cosmetic electrodesiccation  ? ?One touch-up ED treatment today, no charge. Discussed risks of treatment including dyspigmentation, small scar, and/or recurrence. Recommend daily broad spectrum sunscreen SPF 30+/photoprotection to treated areas once healed. ? ?Continue Retin A 0.1% nightly as tolerated to face ? ?Topical retinoid medications like tretinoin/Retin-A, adapalene/Differin, tazarotene/Fabior, and Epiduo/Epiduo Forte can cause dryness and irritation when first started. Only apply a pea-sized amount to the entire affected area. Avoid applying it around the eyes, edges of mouth and creases at the nose. If you  experience irritation, use a good moisturizer first and/or apply the medicine less often. If you are doing well with the medicine, you can increase how often you use it until you are applying every night. Be careful with sun protection while using this medication as it can make you sensitive to the sun. This medicine should not be used by pregnant women.  ? ? ?Destruction of lesion - 7 on right cheek , 1 forehead, 2 left cheek ? ?Destruction method: electrodesiccation and curettage   ?Destruction method comment:  Electrodesiccation, no curretage ?Timeout:  patient name, date of birth, surgical site, and procedure verified ?Hemostasis achieved with:  electrodesiccation ?Outcome: patient tolerated procedure well with no complications   ?Post-procedure details: wound care instructions given   ? ? ?Seborrheic Keratoses ?- Stuck-on, waxy, tan-brown papules and/or plaques at bilateral flanks  ?- Benign-appearing ?- Discussed benign etiology and prognosis. ?- Observe, Recommend starting moisturizer with exfoliant (Urea, Salicylic acid, or Lactic acid) one to two times daily to help smooth rough and bumpy skin.  OTC options include Cetaphil Rough and Bumpy lotion (Urea), Eucerin Roughness Relief lotion or spot treatment cream (Urea), CeraVe SA lotion/cream for Rough and Bumpy skin (Sal Acid), Gold Bond Rough and Bumpy cream (Sal Acid), and AmLactin 12% lotion/cream (Lactic Acid).  If applying in morning, also apply sunscreen to sun-exposed areas, since these exfoliating moisturizers can increase sensitivity to sun.  ?- Call for any changes ? ?Actinic Damage ?- chronic, secondary to cumulative UV radiation exposure/sun exposure over time ?- diffuse scaly erythematous macules with underlying dyspigmentation ?- Recommend daily broad spectrum  sunscreen SPF 30+ to sun-exposed areas, reapply every 2 hours as needed.  ?- Recommend staying in the shade or wearing long sleeves, sun glasses (UVA+UVB protection) and wide brim hats  (4-inch brim around the entire circumference of the hat). ?- Call for new or changing lesions. ? ?Return if symptoms worsen or fail to improve. ?I, Ruthell Rummage, CMA, am acting as scribe for Brendolyn Patty, MD. ? ?Documentation: I have reviewed the above documentation for accuracy and completeness, and I agree with the above. ? ?Brendolyn Patty MD  ? ?

## 2021-12-22 NOTE — Patient Instructions (Addendum)
?For Face  ? ? ?Oily skin OTC leave-on products include OC8 mattifying gel or Cetaphil DermaControl Oil Absorbing moisturizer spf 30.  For cleansers, recommend Epionce Lytic Cleanser or Cetaphil DermaControl Oil removing Foam wash.  May also try OTC acne cleansers formulated with Salicylic acid or Benzoyl Peroxide.  ? ? ?Continue Retin A - for face nightly as tolerated ? ?Topical retinoid medications like tretinoin/Retin-A, adapalene/Differin, tazarotene/Fabior, and Epiduo/Epiduo Forte can cause dryness and irritation when first started. Only apply a pea-sized amount to the entire affected area. Avoid applying it around the eyes, edges of mouth and creases at the nose. If you experience irritation, use a good moisturizer first and/or apply the medicine less often. If you are doing well with the medicine, you can increase how often you use it until you are applying every night. Be careful with sun protection while using this medication as it can make you sensitive to the sun. This medicine should not be used by pregnant women.  ? ? ? ?Seborrheic Keratosis ? ?What causes seborrheic keratoses? ?Seborrheic keratoses are harmless, common skin growths that first appear during adult life.  As time goes by, more growths appear.  Some people may develop a large number of them.  Seborrheic keratoses appear on both covered and uncovered body parts.  They are not caused by sunlight.  The tendency to develop seborrheic keratoses can be inherited.  They vary in color from skin-colored to gray, brown, or even black.  They can be either smooth or have a rough, warty surface.   ?Seborrheic keratoses are superficial and look as if they were stuck on the skin.  Under the microscope this type of keratosis looks like layers upon layers of skin.  That is why at times the top layer may seem to fall off, but the rest of the growth remains and re-grows.   ? ?Treatment ?Seborrheic keratoses do not need to be treated, but can easily be  removed in the office.  Seborrheic keratoses often cause symptoms when they rub on clothing or jewelry.  Lesions can be in the way of shaving.  If they become inflamed, they can cause itching, soreness, or burning.  Removal of a seborrheic keratosis can be accomplished by freezing, burning, or surgery. ?If any spot bleeds, scabs, or grows rapidly, please return to have it checked, as these can be an indication of a skin cancer. ? ?For bumps at sides  ? ? ?Recommend starting moisturizer with exfoliant (Urea, Salicylic acid, or Lactic acid) one to two times daily to help smooth rough and bumpy skin.  OTC options include Cetaphil Rough and Bumpy lotion (Urea), Eucerin Roughness Relief lotion or spot treatment cream (Urea), CeraVe SA lotion/cream for Rough and Bumpy skin (Sal Acid), Gold Bond Rough and Bumpy cream (Sal Acid), and AmLactin 12% lotion/cream (Lactic Acid).  If applying in morning, also apply sunscreen to sun-exposed areas, since these exfoliating moisturizers can increase sensitivity to sun. ? ? ? ? ?If You Need Anything After Your Visit ? ?If you have any questions or concerns for your doctor, please call our main line at [phone] and press option 4 to reach your doctor's medical assistant. If no one answers, please leave a voicemail as directed and we will return your call as soon as possible. Messages left after 4 pm will be answered the following business day.  ? ?You may also send [place] a message via MyChart. We typically respond to MyChart messages within 1-2 business days. ? ?For prescription  refills, please ask your pharmacy to contact our office. Our fax number is [phone]. ? ?If you have an urgent issue when the clinic is closed that cannot wait until the next business day, you can page your doctor at the number below.   ? ?Please note that while we do our best to be available for urgent issues outside of office hours, we are not available 24/7.  ? ?If you have an urgent issue and are unable  to reach [place], you may choose to seek medical care at your doctor's office, retail clinic, urgent care center, or emergency room. ? ?If you have a medical emergency, please immediately call 911 or go to the emergency department. ? ?Pager Numbers ? ?- Dr. Nehemiah Massed: [phone] ? ?- Dr. Laurence Ferrari: [phone] ? ?- Dr. Nicole Kindred: [phone] ? ?In the event of inclement weather, please call our main line at [phone] for an update on the status of any delays or closures. ? ?Dermatology Medication Tips: ?Please keep the boxes that topical medications come in in order to help keep track of the instructions about where and how to use these. Pharmacies typically print the medication instructions only on the boxes and not directly on the medication tubes.  ? ?If your medication is too expensive, please contact our office at [phone] option 4 or send [place] a message through [place].  ? ?We are unable to tell what your co-pay for medications will be in advance as this is different depending on your insurance coverage. However, we may be able to find a substitute medication at lower cost or fill out paperwork to get insurance to cover a needed medication.  ? ?If a prior authorization is required to get your medication covered by your insurance company, please allow [place] 1-2 business days to complete this process. ? ?Drug prices often vary depending on where the prescription is filled and some pharmacies may offer cheaper prices. ? ?The website www.goodrx.com contains coupons for medications through different pharmacies. The prices here do not account for what the cost may be with help from insurance (it may be cheaper with your insurance), but the website can give you the price if you did not use any insurance.  ?- You can print the associated coupon and take it with your prescription to the pharmacy.  ?- You may also stop by our office during regular business hours and pick up a GoodRx coupon card.  ?- If you need your  prescription sent electronically to a different pharmacy, notify our office through [facility] or by phone at [phone] option 4. ? ? ? ? ?Si Usted Necesita Algo Despu?s de Su Visita ? ?Tambi?n puede enviarnos un mensaje a trav?s de MyChart. Por lo general respondemos a los mensajes de MyChart en el transcurso de 1 a 2 d?as h?biles. ? ?Para renovar recetas, por favor pida a su farmacia que se ponga en contacto con nuestra oficina. Nuestro n?mero de fax es el [phone]. ? ?Si tiene un asunto urgente cuando la cl?nica est? cerrada y que no puede esperar hasta el siguiente d?a h?bil, puede llamar/localizar a su doctor(a) al n?mero que aparece a continuaci?n.  ? ?Por favor, tenga en cuenta que aunque hacemos todo lo posible para estar disponibles para asuntos urgentes fuera del horario de oficina, no estamos disponibles las 24 horas del d?a, los 7 d?as de la semana.  ? ?Si tiene un problema urgente y no puede comunicarse con nosotros, puede optar por buscar atenci?n m?dica  en el consultorio  de su doctor(a), en una cl?nica privada, en un centro de atenci?n urgente o en una sala de emergencias. ? ?Si tiene Engineer, maintenance (IT) m?dica, por favor llame inmediatamente al 911 o vaya a la sala de emergencias. ? ?N?meros de b?per ? ?- Dr. Nehemiah Massed: [phone] ? ?- Dra. Moye: [phone] ? ?- Dra. Nicole Kindred: [phone] ? ?En caso de inclemencias del tiempo, por favor llame a nuestra l?nea principal al [phone] para una actualizaci?n sobre el estado de cualquier retraso o cierre. ? ?Consejos para la medicaci?n en dermatolog?a: ?Por favor, guarde las cajas en las que vienen los medicamentos de uso t?pico para ayudarle a seguir las instrucciones sobre d?nde y c?mo usarlos. Las farmacias generalmente imprimen las instrucciones del medicamento s?lo en las cajas y no directamente en los tubos del [place].  ? ?Si su medicamento es muy caro, por favor, p?ngase en contacto con Zigmund Daniel llamando al [phone]  y presione la opci?n 4 o env?enos un mensaje a trav?s de MyChart.  ? ?No podemos decirle cu?l ser? su copago por los medicamentos por adelantado ya que esto es diferente dependiendo de la cobertura de su seguro. S

## 2021-12-29 DIAGNOSIS — E291 Testicular hypofunction: Secondary | ICD-10-CM | POA: Diagnosis not present

## 2021-12-29 DIAGNOSIS — E559 Vitamin D deficiency, unspecified: Secondary | ICD-10-CM | POA: Diagnosis not present

## 2021-12-29 DIAGNOSIS — E7211 Homocystinuria: Secondary | ICD-10-CM | POA: Diagnosis not present

## 2021-12-29 DIAGNOSIS — E039 Hypothyroidism, unspecified: Secondary | ICD-10-CM | POA: Diagnosis not present

## 2021-12-29 LAB — TSH: TSH: 3.12 (ref 0.41–5.90)

## 2021-12-29 LAB — HEPATIC FUNCTION PANEL
ALT: 18 U/L (ref 10–40)
AST: 13 — AB (ref 14–40)
Alkaline Phosphatase: 58 (ref 25–125)
Bilirubin, Total: 0.3

## 2021-12-29 LAB — HEMOGLOBIN A1C: Hemoglobin A1C: 5.5

## 2021-12-29 LAB — BASIC METABOLIC PANEL
BUN: 31 — AB (ref 4–21)
CO2: 22 (ref 13–22)
Chloride: 103 (ref 99–108)
Creatinine: 1.5 — AB (ref 0.6–1.3)
Glucose: 94
Potassium: 4.4 mEq/L (ref 3.5–5.1)
Sodium: 140 (ref 137–147)

## 2021-12-29 LAB — IRON,TIBC AND FERRITIN PANEL
%SAT: 22
Iron: 78
TIBC: 356
UIBC: 278

## 2021-12-29 LAB — CBC: RBC: 5.66 — AB (ref 3.87–5.11)

## 2021-12-29 LAB — LIPID PANEL
Cholesterol: 150 (ref 0–200)
HDL: 20 — AB (ref 35–70)
LDL Cholesterol: 20
Triglycerides: 295 — AB (ref 40–160)

## 2021-12-29 LAB — COMPREHENSIVE METABOLIC PANEL
Albumin: 4.6 (ref 3.5–5.0)
Calcium: 10.4 (ref 8.7–10.7)
Globulin: 2.4
eGFR: 49

## 2021-12-29 LAB — VITAMIN D 25 HYDROXY (VIT D DEFICIENCY, FRACTURES): Vit D, 25-Hydroxy: 81.9

## 2021-12-29 LAB — PSA: PSA: 9.8

## 2021-12-29 LAB — CBC AND DIFFERENTIAL
HCT: 50 (ref 41–53)
Hemoglobin: 16.9 (ref 13.5–17.5)
Neutrophils Absolute: 5.8
Platelets: 165 10*3/uL (ref 150–400)
WBC: 8.2

## 2021-12-29 LAB — VITAMIN B12: Vitamin B-12: 734

## 2022-01-11 ENCOUNTER — Ambulatory Visit (INDEPENDENT_AMBULATORY_CARE_PROVIDER_SITE_OTHER): Payer: Medicare Other | Admitting: Family Medicine

## 2022-01-11 ENCOUNTER — Encounter: Payer: Self-pay | Admitting: Family Medicine

## 2022-01-11 VITALS — BP 114/68 | HR 65 | Temp 97.8°F | Resp 20 | Ht 71.0 in | Wt 186.0 lb

## 2022-01-11 DIAGNOSIS — I10 Essential (primary) hypertension: Secondary | ICD-10-CM | POA: Diagnosis not present

## 2022-01-11 DIAGNOSIS — N1831 Chronic kidney disease, stage 3a: Secondary | ICD-10-CM

## 2022-01-11 DIAGNOSIS — Z1211 Encounter for screening for malignant neoplasm of colon: Secondary | ICD-10-CM | POA: Diagnosis not present

## 2022-01-11 DIAGNOSIS — E663 Overweight: Secondary | ICD-10-CM

## 2022-01-11 DIAGNOSIS — R7303 Prediabetes: Secondary | ICD-10-CM

## 2022-01-11 DIAGNOSIS — Z0000 Encounter for general adult medical examination without abnormal findings: Secondary | ICD-10-CM | POA: Diagnosis not present

## 2022-01-11 DIAGNOSIS — E785 Hyperlipidemia, unspecified: Secondary | ICD-10-CM

## 2022-01-11 DIAGNOSIS — R972 Elevated prostate specific antigen [PSA]: Secondary | ICD-10-CM

## 2022-01-11 DIAGNOSIS — E039 Hypothyroidism, unspecified: Secondary | ICD-10-CM

## 2022-01-11 NOTE — Progress Notes (Signed)
? ? ?I,Gregory Mckay,acting as a scribe for Lavon Paganini, MD.,have documented all relevant documentation on the behalf of Lavon Paganini, MD,as directed by  Lavon Paganini, MD while in the presence of Lavon Paganini, MD. ? ? ?Annual Wellness Visit ? ?  ? ?Patient: Gregory Mckay, Male    DOB: [birth year], 76 y.o.   MRN: [id number] ?Visit Date: 01/11/2022 ? ?Today's Provider: Lavon Paganini, MD  ? ?Chief Complaint  ?Patient presents with  ? Medicare Wellness  ? ?Subjective  ?  ?Gregory Mckay is a 76 y.o. male who presents today for his Annual Wellness Visit. ?He reports consuming a general diet. Gym/ health club routine includes mod to heavy weightlifting. He generally feels well. He reports sleeping well. He does not have additional problems to discuss today.  ? ?HPI ? ? ?Medications: ?Outpatient Medications Prior to Visit  ?Medication Sig  ? amLODipine-benazepril (LOTREL) 5-10 MG capsule TAKE 1 CAPSULE EVERY DAY  ? ARMOUR THYROID 60 MG tablet TAKE ONE TABLET EVERY DAY BEFORE BREAKFAST  ? Biotin 5000 MCG CAPS Take 1 capsule by mouth daily. Taking at 1:00 am  ? Cholecalciferol (VITAMIN D3) 10000 units TABS Take 1 tablet by mouth daily. Taking at 1:00 am  ? Coenzyme Q10 (COQ-10) 50 MG CAPS Take 50 mg by mouth daily. Taking at 6:55 am.  ? GARLIC PO Take 374 mg by mouth 2 (two) times daily. Aged Garlic Extract. Taking at 2:00 am and 2:00 pm  ? Gymnema Sylvestris Leaf POWD Take 400 mg by mouth 2 (two) times daily. Taking at 1:00 am and 1:00 pm  ? Levomefolate Glucosamine (METHYLFOLATE PO) Take 1,000 mcg by mouth daily. Taking at 1:00 am.  ? milk thistle 175 MG tablet Take 175 mg by mouth 2 (two) times daily. Taking at 1:00 am and 1:00 pm  ? Misc Natural Products (BETA-SITOSTEROL PLANT STEROLS) CAPS Take 60 mg by mouth 2 (two) times daily. Taking at 3:00 and 3:00 pm  ? OVER THE COUNTER MEDICATION Take 750 mg by mouth 2 (two) times daily. Curamed. Taking 1 tablet at 1:00 am, and 1 tablet at 1:00 pm.  ?  PHOSPHATIDYLSERINE PO Take 300 mg by mouth daily. Taking at 1:00 am  ? RESVERATROL PO Take 500 mg by mouth daily. Taking at 1:00 am.  ? tadalafil (CIALIS) 5 MG tablet TAKE ONE TABLET EVERY DAY  ? testosterone (ANDROGEL) 50 MG/5GM (1%) GEL Place 5 g onto the skin daily.  ? tretinoin (RETIN-A) 0.1 % cream Apply to face qhs, wash off qam  ? triamcinolone ointment (KENALOG) 0.5 % Apply 1 application topically 2 (two) times daily.  ? vitamin E 400 UNIT capsule Take 400 Units by mouth daily. Taking at 1:00 am  ? Vitamin Mixture (ESTER-C PO) Take 1,000 mg by mouth daily. Taking at 1:00 am  ? rosuvastatin (CRESTOR) 5 MG tablet Take 1 tablet (5 mg total) by mouth daily.  ? ?No facility-administered medications prior to visit.  ?  ?No Known Allergies ? ?Patient Care Team: ?Virginia Crews, MD as PCP - General (Family Medicine) ?Corey Skains, MD as Consulting Physician (Cardiology) ?Royston Cowper, MD as Consulting Physician (Urology) ? ?Review of Systems  ?All other systems reviewed and are negative. ? ?Last CBC ?Lab Results  ?Component Value Date  ? WBC 10.8 (H) 08/01/2018  ? HGB 15.2 08/01/2018  ? HCT 49.7 08/01/2018  ? MCV 82.0 08/01/2018  ? MCH 25.1 (L) 08/01/2018  ? RDW 14.5 08/01/2018  ? PLT 239  08/01/2018  ? ?Last metabolic panel ?Lab Results  ?Component Value Date  ? GLUCOSE 96 05/10/2021  ? NA 142 05/10/2021  ? K 4.3 05/10/2021  ? CL 104 05/10/2021  ? CO2 21 05/10/2021  ? BUN 15 05/10/2021  ? CREATININE 1.17 05/10/2021  ? EGFR 65 05/10/2021  ? CALCIUM 9.6 05/10/2021  ? PROT 6.9 09/10/2020  ? ALBUMIN 4.3 09/10/2020  ? LABGLOB 2.6 09/10/2020  ? AGRATIO 1.7 09/10/2020  ? BILITOT 0.4 09/10/2020  ? ALKPHOS 40 (L) 09/10/2020  ? AST 13 09/10/2020  ? ALT 16 09/10/2020  ? ANIONGAP 8 08/01/2018  ? ?Last lipids ?Lab Results  ?Component Value Date  ? CHOL 126 09/10/2020  ? HDL 24 (L) 09/10/2020  ? [place] 70 09/10/2020  ? TRIG 191 (H) 09/10/2020  ? CHOLHDL 5.3 (H) 09/10/2020  ? ?Last hemoglobin A1c ?Lab Results   ?Component Value Date  ? HGBA1C 5.7 (H) 05/10/2021  ? ?Last thyroid functions ?Lab Results  ?Component Value Date  ? TSH 2.300 05/10/2021  ? ?  ?  ? Objective  ?  ?Vitals: BP 114/68 (BP Location: Left Arm, Patient Position: Sitting, Cuff Size: Large)   Pulse 65   Temp 97.8 ?F (36.6 ?C) (Oral)   Resp 20   Ht $R'5\' 11"'Zn$  (1.803 m)   Wt 186 lb (84.4 kg)   SpO2 98%   BMI 25.94 kg/m?  ?BP Readings from Last 3 Encounters:  ?01/11/22 114/68  ?05/10/21 137/74  ?09/10/20 120/75  ? ?Wt Readings from Last 3 Encounters:  ?01/11/22 186 lb (84.4 kg)  ?05/10/21 188 lb (85.3 kg)  ?09/10/20 200 lb (90.7 kg)  ? ?  ?Physical Exam ?Vitals reviewed.  ?Constitutional:   ?   General: He is not in acute distress. ?   Appearance: Normal appearance. He is well-developed. He is not diaphoretic.  ?HENT:  ?   Head: Normocephalic and atraumatic.  ?   Right Ear: Tympanic membrane, ear canal and external ear normal.  ?   Left Ear: Tympanic membrane, ear canal and external ear normal.  ?   Nose: Nose normal.  ?   Mouth/Throat:  ?   Mouth: Mucous membranes are moist.  ?   Pharynx: Oropharynx is clear. No oropharyngeal exudate.  ?Eyes:  ?   General: No scleral icterus. ?   Conjunctiva/sclera: Conjunctivae normal.  ?   Pupils: Pupils are equal, round, and reactive to light.  ?Neck:  ?   Thyroid: No thyromegaly.  ?Cardiovascular:  ?   Rate and Rhythm: Normal rate and regular rhythm.  ?   Pulses: Normal pulses.  ?   Heart sounds: Normal heart sounds. No murmur heard. ?Pulmonary:  ?   Effort: Pulmonary effort is normal. No respiratory distress.  ?   Breath sounds: Normal breath sounds. No wheezing or rales.  ?Abdominal:  ?   General: There is no distension.  ?   Palpations: Abdomen is soft.  ?   Tenderness: There is no abdominal tenderness.  ?Musculoskeletal:     ?   General: No deformity.  ?   Cervical back: Neck supple.  ?   Right lower leg: No edema.  ?   Left lower leg: No edema.  ?Lymphadenopathy:  ?   Cervical: No cervical adenopathy.  ?Skin: ?    General: Skin is warm and dry.  ?Neurological:  ?   Mental Status: He is alert and oriented to person, place, and time. Mental status is at baseline.  ?   Gait: Gait  normal.  ?Psychiatric:     ?   Mood and Affect: Mood normal.     ?   Behavior: Behavior normal.     ?   Thought Content: Thought content normal.  ? ? ? ?Most recent functional status assessment: ? ?  01/11/2022  ?  8:47 AM  ?In your present state of health, do you have any difficulty performing the following activities:  ?Hearing? 0  ?Vision? 0  ?Difficulty concentrating or making decisions? 0  ?Walking or climbing stairs? 0  ?Dressing or bathing? 0  ?Doing errands, shopping? 0  ? ?Most recent fall risk assessment: ? ?  01/11/2022  ?  8:47 AM  ?Fall Risk   ?Falls in the past year? 0  ?Number falls in past yr: 0  ?Injury with Fall? 0  ?Risk for fall due to : No Fall Risks  ?Follow up Falls evaluation completed  ? ? Most recent depression screenings: ? ?  01/11/2022  ?  8:47 AM 05/10/2021  ?  8:34 AM  ?PHQ 2/9 Scores  ?PHQ - 2 Score 0 0  ?PHQ- 9 Score 0 0  ? ?Most recent cognitive screening: ?   ? View : No data to display.  ?  ?  ?  ? ?Most recent Audit-C alcohol use screening ? ?  01/11/2022  ?  8:46 AM  ?Alcohol Use Disorder Test (AUDIT)  ?1. How often do you have a drink containing alcohol? 0  ?2. How many drinks containing alcohol do you have on a typical day when you are drinking? 0  ?3. How often do you have six or more drinks on one occasion? 0  ?AUDIT-C Score 0  ? ?A score of 3 or more in women, and 4 or more in men indicates increased risk for alcohol abuse, EXCEPT if all of the points are from question 1  ? ?No results found for any visits on 01/11/22. ? Assessment & Plan  ?  ? ?Annual wellness visit done today including the all of the following: ?Reviewed patient's Family Medical History ?Reviewed and updated list of patient's medical providers ?Assessment of cognitive impairment was done ?Assessed patient's functional ability ?Established a  written schedule for health screening services ?Health Risk Assessent Completed and Reviewed ? ?Exercise Activities and Dietary recommendations ? Goals   ? ?  DIET - INCREASE WATER INTAKE   ?  Recommend to drink at [place]

## 2022-01-11 NOTE — Assessment & Plan Note (Signed)
Has seen urology and had negative MRI/biopsy ?PSA slightly improved on last labs ?

## 2022-01-11 NOTE — Assessment & Plan Note (Signed)
Discussed importance of healthy weight management Discussed diet and exercise  

## 2022-01-11 NOTE — Assessment & Plan Note (Signed)
Reviewed recent TSH - wnl ?Continue current meds ?

## 2022-01-11 NOTE — Assessment & Plan Note (Signed)
Last A1c back to normal ?Encourage low carb diet ?

## 2022-01-11 NOTE — Assessment & Plan Note (Addendum)
Well controlled Continue current medications Reviewed recent metabolic panel F/u in 6 months  

## 2022-01-11 NOTE — Assessment & Plan Note (Signed)
Chronic and stable ?Reviewed last CMP - has repeat in a few weeks given slightly elevated Cr ?Avoid NSAIDs ?

## 2022-01-11 NOTE — Assessment & Plan Note (Signed)
Previously well controlled ?Continue statin ?Reviewed recent FLP and CMP - well controlled ?

## 2022-01-17 DIAGNOSIS — Z1211 Encounter for screening for malignant neoplasm of colon: Secondary | ICD-10-CM | POA: Diagnosis not present

## 2022-01-19 ENCOUNTER — Other Ambulatory Visit: Payer: Self-pay | Admitting: Family Medicine

## 2022-01-20 ENCOUNTER — Other Ambulatory Visit: Payer: Self-pay | Admitting: Family Medicine

## 2022-01-20 MED ORDER — ROSUVASTATIN CALCIUM 5 MG PO TABS: 5.0000 mg | ORAL_TABLET | Freq: Every day | ORAL | 3 refills | Status: DC

## 2022-01-25 ENCOUNTER — Other Ambulatory Visit: Payer: Self-pay

## 2022-01-25 DIAGNOSIS — R195 Other fecal abnormalities: Secondary | ICD-10-CM

## 2022-01-25 LAB — COLOGUARD: COLOGUARD: POSITIVE — AB

## 2022-01-26 ENCOUNTER — Other Ambulatory Visit: Payer: Self-pay

## 2022-01-26 ENCOUNTER — Telehealth: Payer: Self-pay

## 2022-01-26 DIAGNOSIS — R195 Other fecal abnormalities: Secondary | ICD-10-CM

## 2022-01-26 MED ORDER — PEG 3350-KCL-NA BICARB-NACL 420 G PO SOLR: 4000.0000 mL | Freq: Once | ORAL | 0 refills | Status: AC

## 2022-01-26 NOTE — Telephone Encounter (Signed)
Gastroenterology Pre-Procedure Review ? ?Request Date: 02/01/22 ?Requesting Physician: Dr. Vicente Males ? ?PATIENT REVIEW QUESTIONS: The patient responded to the following health history questions as indicated:   ? ?1. Are you having any GI issues? yes (possible hemorrhoids, and positive cologuard) ?2. Do you have a personal history of Polyps? no ?3. Do you have a family history of Colon Cancer or Polyps? no ?4. Diabetes Mellitus? no ?5. Joint replacements in the past 12 months?no ?6. Major health problems in the past 3 months?no ?7. Any artificial heart valves, MVP, or defibrillator?no ?   ?MEDICATIONS & ALLERGIES:    ?Patient reports the following regarding taking any anticoagulation/antiplatelet therapy:   ?Plavix, Coumadin, Eliquis, Xarelto, Lovenox, Pradaxa, Brilinta, or Effient? no ?Aspirin? no ? ?Patient confirms/reports the following medications:  ?Current Outpatient Medications  ?Medication Sig Dispense Refill  ? amLODipine-benazepril (LOTREL) 5-10 MG capsule TAKE 1 CAPSULE BY MOUTH ONCE DAILY 90 capsule 3  ? ARMOUR THYROID 60 MG tablet TAKE ONE TABLET EVERY DAY BEFORE BREAKFAST 90 tablet 0  ? Biotin 5000 MCG CAPS Take 1 capsule by mouth daily. Taking at 1:00 am    ? Cholecalciferol (VITAMIN D3) 10000 units TABS Take 1 tablet by mouth daily. Taking at 1:00 am    ? Coenzyme Q10 (COQ-10) 50 MG CAPS Take 50 mg by mouth daily. Taking at 4:26 am.    ? GARLIC PO Take 834 mg by mouth 2 (two) times daily. Aged Garlic Extract. Taking at 2:00 am and 2:00 pm    ? Gymnema Sylvestris Leaf POWD Take 400 mg by mouth 2 (two) times daily. Taking at 1:00 am and 1:00 pm    ? Levomefolate Glucosamine (METHYLFOLATE PO) Take 1,000 mcg by mouth daily. Taking at 1:00 am.    ? milk thistle 175 MG tablet Take 175 mg by mouth 2 (two) times daily. Taking at 1:00 am and 1:00 pm    ? Misc Natural Products (BETA-SITOSTEROL PLANT STEROLS) CAPS Take 60 mg by mouth 2 (two) times daily. Taking at 3:00 and 3:00 pm    ? OVER THE COUNTER MEDICATION  Take 750 mg by mouth 2 (two) times daily. Curamed. Taking 1 tablet at 1:00 am, and 1 tablet at 1:00 pm.    ? PHOSPHATIDYLSERINE PO Take 300 mg by mouth daily. Taking at 1:00 am    ? RESVERATROL PO Take 500 mg by mouth daily. Taking at 1:00 am.    ? rosuvastatin (CRESTOR) 5 MG tablet Take 1 tablet (5 mg total) by mouth daily. 90 tablet 3  ? tadalafil (CIALIS) 5 MG tablet TAKE 1 TABLET BY MOUTH ONCE DAILY 90 tablet 0  ? testosterone (ANDROGEL) 50 MG/5GM (1%) GEL Place 5 g onto the skin daily.    ? tretinoin (RETIN-A) 0.1 % cream Apply to face qhs, wash off qam 20 g 2  ? triamcinolone ointment (KENALOG) 0.5 % Apply 1 application topically 2 (two) times daily. 30 g 2  ? vitamin E 400 UNIT capsule Take 400 Units by mouth daily. Taking at 1:00 am    ? Vitamin Mixture (ESTER-C PO) Take 1,000 mg by mouth daily. Taking at 1:00 am    ? ?No current facility-administered medications for this visit.  ? ? ?Patient confirms/reports the following allergies:  ?No Known Allergies ? ?No orders of the defined types were placed in this encounter. ? ? ?AUTHORIZATION INFORMATION ?Primary Insurance: ?1D#: ?Group #: ? ?Secondary Insurance: ?1D#: ?Group #: ? ?SCHEDULE INFORMATION: ?Date: 02/01/22 ?Time: ?Location: [facility] ?

## 2022-01-27 ENCOUNTER — Other Ambulatory Visit: Payer: Self-pay

## 2022-02-01 ENCOUNTER — Encounter
Admission: RE | Disposition: A | Discharge status: Home / Self Care | Payer: Self-pay | Source: Home / Self Care | Attending: Gastroenterology

## 2022-02-01 ENCOUNTER — Encounter: Payer: Self-pay | Admitting: Gastroenterology

## 2022-02-01 ENCOUNTER — Ambulatory Visit: Payer: Medicare Other | Admitting: Certified Registered Nurse Anesthetist

## 2022-02-01 ENCOUNTER — Other Ambulatory Visit: Payer: Self-pay

## 2022-02-01 ENCOUNTER — Ambulatory Visit
Admission: RE | Admit: 2022-02-01 | Discharge: 2022-02-01 | Disposition: A | Discharge status: Home / Self Care | Payer: Medicare Other | Attending: Gastroenterology | Admitting: Gastroenterology

## 2022-02-01 DIAGNOSIS — D125 Benign neoplasm of sigmoid colon: Secondary | ICD-10-CM | POA: Diagnosis not present

## 2022-02-01 DIAGNOSIS — I480 Paroxysmal atrial fibrillation: Secondary | ICD-10-CM | POA: Diagnosis not present

## 2022-02-01 DIAGNOSIS — D374 Neoplasm of uncertain behavior of colon: Secondary | ICD-10-CM | POA: Insufficient documentation

## 2022-02-01 DIAGNOSIS — K6389 Other specified diseases of intestine: Secondary | ICD-10-CM

## 2022-02-01 DIAGNOSIS — I10 Essential (primary) hypertension: Secondary | ICD-10-CM | POA: Diagnosis not present

## 2022-02-01 DIAGNOSIS — R195 Other fecal abnormalities: Secondary | ICD-10-CM

## 2022-02-01 DIAGNOSIS — Z1211 Encounter for screening for malignant neoplasm of colon: Secondary | ICD-10-CM | POA: Diagnosis not present

## 2022-02-01 DIAGNOSIS — K635 Polyp of colon: Secondary | ICD-10-CM

## 2022-02-01 DIAGNOSIS — E039 Hypothyroidism, unspecified: Secondary | ICD-10-CM | POA: Diagnosis not present

## 2022-02-01 DIAGNOSIS — N289 Disorder of kidney and ureter, unspecified: Secondary | ICD-10-CM | POA: Diagnosis not present

## 2022-02-01 DIAGNOSIS — C182 Malignant neoplasm of ascending colon: Secondary | ICD-10-CM | POA: Diagnosis not present

## 2022-02-01 DIAGNOSIS — D122 Benign neoplasm of ascending colon: Secondary | ICD-10-CM

## 2022-02-01 HISTORY — PX: COLONOSCOPY WITH PROPOFOL: SHX5780

## 2022-02-01 SURGERY — COLONOSCOPY WITH PROPOFOL
Anesthesia: General

## 2022-02-01 MED ORDER — PHENYLEPHRINE HCL (PRESSORS) 10 MG/ML IV SOLN
INTRAVENOUS | Status: DC | PRN
  Administered 2022-02-01 (×2): 80 ug via INTRAVENOUS

## 2022-02-01 MED ORDER — PROPOFOL 10 MG/ML IV BOLUS
INTRAVENOUS | Status: DC | PRN
  Administered 2022-02-01: 20 mg via INTRAVENOUS
  Administered 2022-02-01: 60 mg via INTRAVENOUS
  Administered 2022-02-01: 20 mg via INTRAVENOUS

## 2022-02-01 MED ORDER — SODIUM CHLORIDE 0.9 % IV SOLN: INTRAVENOUS | Status: DC

## 2022-02-01 MED ORDER — PROPOFOL 500 MG/50ML IV EMUL
INTRAVENOUS | Status: DC | PRN
Start: 2022-02-01 — End: 2022-02-01
  Administered 2022-02-01: 160 ug/kg/min via INTRAVENOUS

## 2022-02-01 MED ORDER — PHENYLEPHRINE 80 MCG/ML (10ML) SYRINGE FOR IV PUSH (FOR BLOOD PRESSURE SUPPORT)
PREFILLED_SYRINGE | INTRAVENOUS | Status: AC
Start: 2022-02-01 — End: ?
  Filled 2022-02-01: qty 10

## 2022-02-01 NOTE — Transfer of Care (Signed)
Immediate Anesthesia Transfer of Care Note ? ?Patient: Gregory Mckay ? ?Procedure(s) Performed: COLONOSCOPY WITH PROPOFOL ? ?Patient Location: PACU ? ?Anesthesia Type:General ? ?Level of Consciousness: drowsy ? ?Airway & Oxygen Therapy: Patient Spontanous Breathing ? ?Post-op Assessment: Report given to RN and Post -op Vital signs reviewed and stable ? ?Post vital signs: Reviewed and stable ? ?Last Vitals:  ?Vitals Value Taken Time  ?BP 130/80 02/01/22 1119  ?Temp    ?Pulse 68 02/01/22 1120  ?Resp 14 02/01/22 1120  ?SpO2 98 % 02/01/22 1120  ?Vitals shown include unvalidated device data. ? ?Last Pain:  ?Vitals:  ? 02/01/22 0936  ?TempSrc: Temporal  ?PainSc: 0-No pain  ?   ? ?  ? ?Complications: No notable events documented. ?

## 2022-02-01 NOTE — Op Note (Signed)
[facility] ?Gastroenterology ?Patient Name: Gregory Mckay ?Procedure Date: 02/01/2022 10:39 AM ?MRN: [id number] ?Account #: [id number] ?Date of Birth: 1946/09/13 ?Admit Type: Outpatient ?Age: 76 ?Room: [facility] ENDO ROOM 2 ?Gender: Male ?Note Status: Finalized ?Instrument Name: Colonoscope [id number] ?Procedure:             Colonoscopy ?Indications:           Positive Cologuard test ?Providers:             Jonathon Bellows MD, MD ?Referring MD:          Dionne Bucy. Bacigalupo (Referring MD) ?Medicines:             Monitored Anesthesia Care ?Complications:         No immediate complications. ?Procedure:             Pre-Anesthesia Assessment: ?                       - Prior to the procedure, a History and Physical was  ?                       performed, and patient medications, allergies and  ?                       sensitivities were reviewed. The patient's tolerance  ?                       of previous anesthesia was reviewed. ?                       - The risks and benefits of the procedure and the  ?                       sedation options and risks were discussed with the  ?                       patient. All questions were answered and informed  ?                       consent was obtained. ?                       - ASA Grade Assessment: II - A patient with mild  ?                       systemic disease. ?                       - After reviewing the risks and benefits, the patient  ?                       was deemed in satisfactory condition to undergo the  ?                       procedure. ?                       After obtaining informed consent, the colonoscope was  ?                       passed under direct vision. Throughout the procedure,  ?  the patient's blood pressure, pulse, and oxygen  ?                       saturations were monitored continuously. The  ?                       Colonoscope was introduced through the anus and  ?                       advanced to the the cecum,  identified by the  ?                       appendiceal orifice. The colonoscopy was performed  ?                       with ease. The patient tolerated the procedure well.  ?                       The quality of the bowel preparation was poor. ?Findings: ?     The perianal and digital rectal examinations were normal. ?     Three sessile polyps were found in the ascending colon. The polyps were  ?     4 to 6 mm in size. These polyps were removed with a cold snare.  ?     Resection and retrieval were complete. ?     Two semi-pedunculated polyps were found in the ascending colon. The  ?     polyps were 10 to 12 mm in size. These polyps were removed with a hot  ?     snare. Resection and retrieval were complete. ?     An ulcerated non-obstructing large mass was found in the distal  ?     ascending colon. The mass was partially circumferential (involving  ?     one-third of the lumen circumference). The mass measured two cm in  ?     length. In addition, its diameter measured four mm. No bleeding was  ?     present. Biopsies were taken with a cold forceps for histology. Area was  ?     tattooed with an injection of Spot (carbon black). Tatoo applied to  ?     proximal and distal asapects of the mass ?     Two sessile polyps were found in the sigmoid colon. The polyps were 8 to  ?     10 mm in size. These polyps were removed with a hot snare. Resection and  ?     retrieval were complete. ?Impression:            - Preparation of the colon was poor. ?                       - Three 4 to 6 mm polyps in the ascending colon,  ?                       removed with a cold snare. Resected and retrieved. ?                       - Two 10 to 12 mm polyps in the ascending colon,  ?  removed with a hot snare. Resected and retrieved. ?                       - Rule out malignancy, tumor in the distal ascending  ?                       colon. Biopsied. Tattooed. ?                       - Two 8 to 10 mm polyps in the  sigmoid colon, removed  ?                       with a hot snare. Resected and retrieved. ?Recommendation:        - Discharge patient to home (with escort). ?                       - Resume previous diet. ?                       - Continue present medications. ?                       - Await pathology results. ?                       - 1. Poor prep repeat colonoscopy would bne needed  ?                       with 2 day prep depenmding on biopsies of the large  ?                       mass likel lesion that was sampled ?                       2. Likely failure of cologuard to pick up these  ?                       lesions that were multiple on last cologuard 3 years  ?                       back that was negative . ?                       3. Will need to discuss next steps after path results ?Procedure Code(s):     --- Professional --- ?                       [phone], Colonoscopy, flexible; with removal of  ?                       tumor(s), polyp(s), or other lesion(s) by snare  ?                       technique ?                       45381, Colonoscopy, flexible; with directed submucosal  ?  injection(s), any substance ?                       45380, 59, Colonoscopy, flexible; with biopsy, single  ?                       or multiple ?Diagnosis Code(s):     --- Professional --- ?                       K63.5, Polyp of colon ?                       D49.0, Neoplasm of unspecified behavior of digestive  ?                       system ?                       R19.5, Other fecal abnormalities ?CPT copyright 2019 American Medical Association. All rights reserved. ?The codes documented in this report are preliminary and upon coder review may  ?be revised to meet current compliance requirements. ?Jonathon Bellows, MD ?Jonathon Bellows MD, MD ?02/01/2022 11:21:43 AM ?This report has been signed electronically. ?Number of Addenda: 0 ?Note Initiated On: 02/01/2022 10:39 AM ?Scope Withdrawal Time: 0 hours 13 minutes 3 seconds   ?Total Procedure Duration: 0 hours 20 minutes 1 second  ?Estimated Blood Loss:  Estimated blood loss: none. ?     [facility] ?

## 2022-02-01 NOTE — Anesthesia Preprocedure Evaluation (Signed)
Anesthesia Evaluation  ?Patient identified by MRN, date of birth, ID band ?Patient awake ? ? ? ?Reviewed: ?Allergy & Precautions, H&P , NPO status , Patient's Chart, lab work & pertinent test results, reviewed documented beta blocker date and time  ? ?Airway ?Mallampati: II ? ? ?Neck ROM: full ? ? ? Dental ? ?(+) Poor Dentition ?  ?Pulmonary ?neg pulmonary ROS,  ?  ?Pulmonary exam normal ? ? ? ? ? ? ? Cardiovascular ?Exercise Tolerance: Good ?hypertension, negative cardio ROS ?Normal cardiovascular exam ?Rhythm:regular Rate:Normal ? ? ?  ?Neuro/Psych ?negative neurological ROS ? negative psych ROS  ? GI/Hepatic ?negative GI ROS, Neg liver ROS,   ?Endo/Other  ?Hypothyroidism  ? Renal/GU ?Renal disease  ?negative genitourinary ?  ?Musculoskeletal ? ? Abdominal ?  ?Peds ? Hematology ?negative hematology ROS ?(+)   ?Anesthesia Other Findings ?Past Medical History: ?10/08/2019: Basal cell carcinoma ?    Comment:  Right calf. Nodular ?10/08/2019: Basal cell carcinoma ?    Comment:  Right mid medial dorsum forearm. Nodular and  ?             infiltrative ?11/05/2019: Basal cell carcinoma ?    Comment:  Left superior pectoral/medial infraclavicular. Nodular. ?No date: Cataract ?No date: Hypertension ?No date: Thyroid disease ?Past Surgical History: ?No date: EYE SURGERY ?1998: HERNIA REPAIR ?    Comment:  abdominal ?BMI   ? Body Mass Index: 25.80 kg/m?  ?  ? Reproductive/Obstetrics ?negative OB ROS ? ?  ? ? ? ? ? ? ? ? ? ? ? ? ? ?  ?  ? ? ? ? ? ? ? ? ?Anesthesia Physical ?Anesthesia Plan ? ?ASA: 3 ? ?Anesthesia Plan: General  ? ?Post-op Pain Management:   ? ?Induction:  ? ?PONV Risk Score and Plan:  ? ?Airway Management Planned:  ? ?Additional Equipment:  ? ?Intra-op Plan:  ? ?Post-operative Plan:  ? ?Informed Consent: I have reviewed the patients History and Physical, chart, labs and discussed the procedure including the risks, benefits and alternatives for the proposed anesthesia with the  patient or authorized representative who has indicated his/her understanding and acceptance.  ? ? ? ?Dental Advisory Given ? ?Plan Discussed with: CRNA ? ?Anesthesia Plan Comments:   ? ? ? ? ? ? ?Anesthesia Quick Evaluation ? ?

## 2022-02-01 NOTE — Consult Note (Signed)
? ?[facility] Cardiology Consultation Note  ?Patient ID: Gregory Mckay, MRN: [id number], DOB/AGE: 75-Jun-1947 75 y.o. ?Admit date: 02/01/2022   Date of Consult: 02/01/2022 ?Primary Physician: Virginia Crews, MD ?Primary Cardiologist: Nehemiah Massed ? ?Chief Complaint: No chief complaint on file. ? ?Reason for Consult:  Atrial fibrillation ? ?HPI: 76 y.o. male with known hypertension hyperlipidemia with previous history of apparent coronary atherosclerosis who has had some significant concerns for colonoscopy and colon polyps.  The patient underwent a colonoscopy for which he fared well with no evidence of significant side effects or issues.  At that time the telemetry did show May had an irregular heartbeat and EKG subsequently has shown atrial fibrillation with controlled ventricular rate.  The patient recovered well but has had some concerns for irregular heartbeat especially since she will have to have surgical intervention for his colon polyp.  Currently the patient does have due to moderate amount of activity with no evidence of significant angina but does have shortness of breath with activity relieved by rest.  This may be secondary to atrial fibrillation or other cardiovascular concerns currently he is stable and okay for discharge home ? ?Past Medical History:  ?Diagnosis Date  ? Basal cell carcinoma 10/08/2019  ? Right calf. Nodular  ? Basal cell carcinoma 10/08/2019  ? Right mid medial dorsum forearm. Nodular and infiltrative  ? Basal cell carcinoma 11/05/2019  ? Left superior pectoral/medial infraclavicular. Nodular.  ? Cataract   ? Hypertension   ? Thyroid disease   ?   ? ?Surgical History:  ?Past Surgical History:  ?Procedure Laterality Date  ? EYE SURGERY    ? HERNIA REPAIR  1998  ? abdominal  ?  ? ?Home Meds: ?Prior to Admission medications   ?Medication Sig Start Date End Date Taking? Authorizing Provider  ?amLODipine-benazepril (LOTREL) 5-10 MG capsule TAKE 1 CAPSULE BY MOUTH ONCE DAILY 01/19/22  Yes  Bacigalupo, Dionne Bucy, MD  ?ARMOUR THYROID 60 MG tablet TAKE ONE TABLET EVERY DAY BEFORE BREAKFAST 10/29/21  Yes Bacigalupo, Dionne Bucy, MD  ?Biotin 5000 MCG CAPS Take 1 capsule by mouth daily. Taking at 1:00 am   Yes [provider]  ?Cholecalciferol (VITAMIN D3) 10000 units TABS Take 1 tablet by mouth daily. Taking at 1:00 am   Yes [provider]  ?Coenzyme Q10 (COQ-10) 50 MG CAPS Take 50 mg by mouth daily. Taking at 1:00 am.   Yes [provider]  ?GARLIC PO Take 481 mg by mouth 2 (two) times daily. Aged Garlic Extract. Taking at 2:00 am and 2:00 pm   Yes [provider]  ?Gymnema Sylvestris Leaf POWD Take 400 mg by mouth 2 (two) times daily. Taking at 1:00 am and 1:00 pm   Yes [provider]  ?Levomefolate Glucosamine (METHYLFOLATE PO) Take 1,000 mcg by mouth daily. Taking at 1:00 am.   Yes [provider]  ?milk thistle 175 MG tablet Take 175 mg by mouth 2 (two) times daily. Taking at 1:00 am and 1:00 pm   Yes [provider]  ?Misc Natural Products (BETA-SITOSTEROL PLANT STEROLS) CAPS Take 60 mg by mouth 2 (two) times daily. Taking at 3:00 and 3:00 pm   Yes [provider]  ?OVER THE COUNTER MEDICATION Take 750 mg by mouth 2 (two) times daily. Curamed. Taking 1 tablet at 1:00 am, and 1 tablet at 1:00 pm.   Yes [provider]  ?PHOSPHATIDYLSERINE PO Take 300 mg by mouth daily. Taking at 1:00 am   Yes [provider]  ?RESVERATROL PO Take 500 mg by mouth daily. Taking at 1:00 am.   Yes [provider]  ?rosuvastatin (CRESTOR) 5 MG tablet Take 1 tablet (5 mg total) by mouth daily. 01/20/22 02/16/23 Yes Bacigalupo, Dionne Bucy, MD  ?tadalafil (CIALIS) 5 MG tablet TAKE 1 TABLET BY MOUTH ONCE DAILY 01/19/22  Yes Myles Gip, DO  ?testosterone (ANDROGEL) 50 MG/5GM (1%) GEL Place 5 g onto the skin daily.   Yes [provider]  ?tretinoin (RETIN-A) 0.1 % cream Apply to face qhs, wash off qam 10/06/21  Yes Brendolyn Patty, MD  ?triamcinolone ointment (KENALOG) 0.5 % Apply 1 application topically 2 (two) times daily. 09/05/18  Yes Bacigalupo, Dionne Bucy, MD  ?vitamin E 400 UNIT capsule Take 400 Units by mouth daily. Taking at 1:00 am   Yes [provider]  ?Vitamin Mixture (ESTER-C PO) Take 1,000 mg by mouth daily. Taking at 1:00 am   Yes [provider]  ? ? ?Inpatient Medications:  ? ? sodium chloride 700 mL/hr at 02/01/22 1113  ? ? ?Allergies: No Known Allergies ? ?Social History  ? ?Socioeconomic History  ? Marital status: Married  ?  Spouse name: Tommie  ? Number of children: 2  ? Years of education: Not on file  ? Highest education level: Associate degree: occupational, Hotel manager, or vocational program  ?Occupational History  ? Occupation: President/CEO  ?  Comment: [employer]  ?Tobacco Use  ? Smoking status: Never  ? Smokeless tobacco: Never  ?Vaping Use  ? Vaping Use: Never used  ?Substance and Sexual Activity  ? Alcohol use: No  ? Drug use: No  ? Sexual activity: Yes  ?  Partners: Female  ?Other Topics Concern  ? Not on file  ?Social History Narrative  ? Not on file  ? ?Social Determinants of Health  ? ?Financial Resource Strain: Not on file  ?Food Insecurity: Not on file  ?Transportation Needs: Not on file  ?Physical Activity: Not on file  ?Stress: Not on file  ?Social Connections: Not on file  ?Intimate Partner Violence: Not on file  ?  ? ?Family History  ?Problem Relation Age of Onset  ? Healthy Mother   ? Healthy Father   ? Colon cancer Neg Hx   ? Prostate cancer Neg Hx   ?  ? ?Review of Systems ?Positive for palpitations ?Negative for: ?General:  chills, fever, night sweats or weight changes.  ?Cardiovascular: PND orthopnea syncope dizziness  ?Dermatological skin lesions rashes ?Respiratory: Cough congestion ?Urologic: Frequent urination urination at night and hematuria ?Abdominal: negative for nausea, vomiting, diarrhea, bright red blood per rectum, melena, or  hematemesis ?Neurologic: negative for visual changes, and/or hearing changes  ?All other systems reviewed and are otherwise negative except as noted above. ? ?Labs: ?No results for input(s): CKTOTAL, CKMB, TROPONINI in the last 72 hours. ?Lab Results  ?Component Value Date  ? WBC 8.2 12/29/2021  ? HGB 16.9 12/29/2021  ? HCT 50 12/29/2021  ? MCV 82.0 08/01/2018  ? PLT 165 12/29/2021  ? No results for input(s): NA, K, CL, CO2, BUN, CREATININE, CALCIUM, PROT, BILITOT, ALKPHOS, ALT, AST, GLUCOSE in the last 168 hours. ? ?Invalid input(s): LABALBU ?Lab Results  ?Component Value Date  ? CHOL 150 12/29/2021  ? HDL 20 (A) 12/29/2021  ? Floyd 20 12/29/2021  ? TRIG 295 (A) 12/29/2021  ? ?No results found for: DDIMER ? ?Radiology/Studies:  ?No results found. ? ?EKG: Atrial fibrillation with nonspecific ST and T  wave changes ? ?Weights: ?Filed Weights  ? 01/27/22 1436 02/01/22 0936  ?Weight: 85.3 kg 83.9 kg  ? ? ? ?Physical Exam: ?Blood pressure 130/70, pulse 78, temperature (!) 97 ?F (36.1 ?C), temperature source Temporal, resp. rate 18, height '5\' 11"'$  (1.803 m), weight 83.9 kg, SpO2 100 %. Body mass index is 25.8 kg/m?. ?General: Well developed, well nourished, in no acute distress. ?Head eyes ears nose throat: Normocephalic, atraumatic, sclera non-icteric, no xanthomas, nares are without discharge. No apparent thyromegaly and/or mass  ?Lungs: Normal respiratory effort.  no wheezes, no rales, no rhonchi.  ?Heart: Irregular with normal S1 S2. no murmur gallop, no rub, PMI is normal size and placement, carotid upstroke normal without bruit, jugular venous pressure is normal ?Abdomen: Soft, non-tender, non-distended with normoactive bowel sounds. No hepatomegaly. No rebound/guarding. No obvious abdominal masses. Abdominal aorta is normal size without bruit ?Extremities: No edema. no cyanosis, no clubbing, no ulcers  ?Peripheral : 2+ bilateral upper extremity pulses, 2+ bilateral femoral pulses, 2+ bilateral dorsal pedal  pulse ?Neuro: Alert and oriented. No facial asymmetry. No focal deficit. Moves all extremities spontaneously. ?Musculoskeletal: Normal muscle tone without kyphosis ?Psych:  Responds to questions appropriately with a normal aff

## 2022-02-01 NOTE — Anesthesia Postprocedure Evaluation (Signed)
Anesthesia Post Note ? ?Patient: Gregory Mckay ? ?Procedure(s) Performed: COLONOSCOPY WITH PROPOFOL ? ?Patient location during evaluation: PACU ?Anesthesia Type: General ?Level of consciousness: awake and alert ?Pain management: pain level controlled ?Vital Signs Assessment: post-procedure vital signs reviewed and stable ?Respiratory status: spontaneous breathing, nonlabored ventilation, respiratory function stable and patient connected to nasal cannula oxygen ?Cardiovascular status: blood pressure returned to baseline and stable ?Postop Assessment: no apparent nausea or vomiting ?Anesthetic complications: no ? ? ?No notable events documented. ? ? ?Last Vitals:  ?Vitals:  ? 02/01/22 0936 02/01/22 1210  ?BP: (!) 142/76 130/70  ?Pulse: 78   ?Resp: 18   ?Temp: (!) 36.1 ?C   ?SpO2: 100%   ?  ?Last Pain:  ?Vitals:  ? 02/01/22 1210  ?TempSrc:   ?PainSc: 0-No pain  ? ? ?  ?  ?  ?  ?  ?  ? ?Molli Barrows ? ? ? ? ?

## 2022-02-01 NOTE — H&P (Signed)
? ? ? ?Gregory Bellows, MD ?[address] [place], [place], [place], [address] ?[address] [place], [place], [place], [address] ?Phone: [phone]  ?Fax: [phone] ? ?Primary Care Physician:  Virginia Crews, MD ? ? ?Pre-Procedure History & Physical: ?HPI:  Gregory Mckay is a 76 y.o. male is here for an colonoscopy. ?  ?Past Medical History:  ?Diagnosis Date  ? Basal cell carcinoma 10/08/2019  ? Right calf. Nodular  ? Basal cell carcinoma 10/08/2019  ? Right mid medial dorsum forearm. Nodular and infiltrative  ? Basal cell carcinoma 11/05/2019  ? Left superior pectoral/medial infraclavicular. Nodular.  ? Cataract   ? Hypertension   ? Thyroid disease   ? ? ?Past Surgical History:  ?Procedure Laterality Date  ? EYE SURGERY    ? HERNIA REPAIR  1998  ? abdominal  ? ? ?Prior to Admission medications   ?Medication Sig Start Date End Date Taking? Authorizing Provider  ?amLODipine-benazepril (LOTREL) 5-10 MG capsule TAKE 1 CAPSULE BY MOUTH ONCE DAILY 01/19/22  Yes Bacigalupo, Dionne Bucy, MD  ?ARMOUR THYROID 60 MG tablet TAKE ONE TABLET EVERY DAY BEFORE BREAKFAST 10/29/21  Yes Bacigalupo, Dionne Bucy, MD  ?Biotin 5000 MCG CAPS Take 1 capsule by mouth daily. Taking at 1:00 am   Yes [provider]  ?Cholecalciferol (VITAMIN D3) 10000 units TABS Take 1 tablet by mouth daily. Taking at 1:00 am   Yes [provider]  ?Coenzyme Q10 (COQ-10) 50 MG CAPS Take 50 mg by mouth daily. Taking at 1:00 am.   Yes [provider]  ?GARLIC PO Take 761 mg by mouth 2 (two) times daily. Aged Garlic Extract. Taking at 2:00 am and 2:00 pm   Yes [provider]  ?Gymnema Sylvestris Leaf POWD Take 400 mg by mouth 2 (two) times daily. Taking at 1:00 am and 1:00 pm   Yes [provider]  ?Levomefolate Glucosamine (METHYLFOLATE PO) Take 1,000 mcg by mouth daily. Taking at 1:00 am.   Yes [provider]  ?milk thistle 175 MG tablet Take 175 mg by mouth 2 (two) times daily. Taking at 1:00 am and  1:00 pm   Yes [provider]  ?Misc Natural Products (BETA-SITOSTEROL PLANT STEROLS) CAPS Take 60 mg by mouth 2 (two) times daily. Taking at 3:00 and 3:00 pm   Yes [provider]  ?OVER THE COUNTER MEDICATION Take 750 mg by mouth 2 (two) times daily. Curamed. Taking 1 tablet at 1:00 am, and 1 tablet at 1:00 pm.   Yes [provider]  ?PHOSPHATIDYLSERINE PO Take 300 mg by mouth daily. Taking at 1:00 am   Yes [provider]  ?RESVERATROL PO Take 500 mg by mouth daily. Taking at 1:00 am.   Yes [provider]  ?rosuvastatin (CRESTOR) 5 MG tablet Take 1 tablet (5 mg total) by mouth daily. 01/20/22 02/16/23 Yes Bacigalupo, Dionne Bucy, MD  ?tadalafil (CIALIS) 5 MG tablet TAKE 1 TABLET BY MOUTH ONCE DAILY 01/19/22  Yes Myles Gip, DO  ?testosterone (ANDROGEL) 50 MG/5GM (1%) GEL Place 5 g onto the skin daily.   Yes [provider]  ?tretinoin (RETIN-A) 0.1 % cream Apply to face qhs, wash off qam 10/06/21  Yes Brendolyn Patty, MD  ?triamcinolone ointment (KENALOG) 0.5 % Apply 1 application topically 2 (two) times daily. 09/05/18  Yes Bacigalupo, Dionne Bucy, MD  ?vitamin E 400 UNIT capsule Take 400 Units by mouth daily. Taking at 1:00 am   Yes [provider]  ?Vitamin  Mixture (ESTER-C PO) Take 1,000 mg by mouth daily. Taking at 1:00 am   Yes [provider]  ? ? ?Allergies as of 01/26/2022  ? (No Known Allergies)  ? ? ?Family History  ?Problem Relation Age of Onset  ? Healthy Mother   ? Healthy Father   ? Colon cancer Neg Hx   ? Prostate cancer Neg Hx   ? ? ?Social History  ? ?Socioeconomic History  ? Marital status: Married  ?  Spouse name: Tommie  ? Number of children: 2  ? Years of education: Not on file  ? Highest education level: Associate degree: occupational, Hotel manager, or vocational program  ?Occupational History  ? Occupation: President/CEO  ?  Comment: [employer]  ?Tobacco Use  ? Smoking status: Never  ? Smokeless tobacco:  Never  ?Vaping Use  ? Vaping Use: Never used  ?Substance and Sexual Activity  ? Alcohol use: No  ? Drug use: No  ? Sexual activity: Yes  ?  Partners: Female  ?Other Topics Concern  ? Not on file  ?Social History Narrative  ? Not on file  ? ?Social Determinants of Health  ? ?Financial Resource Strain: Not on file  ?Food Insecurity: Not on file  ?Transportation Needs: Not on file  ?Physical Activity: Not on file  ?Stress: Not on file  ?Social Connections: Not on file  ?Intimate Partner Violence: Not on file  ? ? ?Review of Systems: ?See HPI, otherwise negative ROS ? ?Physical Exam: ?BP (!) 142/76   Pulse 78   Temp (!) 97 ?F (36.1 ?C) (Temporal)   Resp 18   Ht '5\' 11"'$  (1.803 m)   Wt 83.9 kg   SpO2 100%   BMI 25.80 kg/m?  ?General:   Alert,  pleasant and cooperative in NAD ?Head:  Normocephalic and atraumatic. ?Neck:  Supple; no masses or thyromegaly. ?Lungs:  Clear throughout to auscultation, normal respiratory effort.    ?Heart:  +S1, +S2, Regular rate and rhythm, No edema. ?Abdomen:  Soft, nontender and nondistended. Normal bowel sounds, without guarding, and without rebound.   ?Neurologic:  Alert and  oriented x4;  grossly normal neurologically. ? ?Impression/Plan: ?Gregory Mckay is here for an colonoscopy to be performed for positive cologuard.  ?Risks, benefits, limitations, and alternatives regarding  colonoscopy have been reviewed with the patient.  Questions have been answered.  All parties agreeable. ? ? ?Gregory Bellows, MD  02/01/2022, 10:38 AM ? ?

## 2022-02-01 NOTE — Anesthesia Procedure Notes (Signed)
Date/Time: 02/01/2022 10:52 AM ?Performed by: Demetrius Charity, CRNA ?Pre-anesthesia Checklist: Patient identified, Emergency Drugs available, Suction available, Patient being monitored and Timeout performed ?Patient Re-evaluated:Patient Re-evaluated prior to induction ?Oxygen Delivery Method: Nasal cannula ?Induction Type: IV induction ?Placement Confirmation: CO2 detector and positive ETCO2 ? ? ? ? ?

## 2022-02-02 ENCOUNTER — Other Ambulatory Visit: Payer: Self-pay | Admitting: Family Medicine

## 2022-02-02 ENCOUNTER — Encounter: Payer: Self-pay | Admitting: Gastroenterology

## 2022-02-02 DIAGNOSIS — E039 Hypothyroidism, unspecified: Secondary | ICD-10-CM

## 2022-02-02 LAB — SURGICAL PATHOLOGY

## 2022-02-03 DIAGNOSIS — I4891 Unspecified atrial fibrillation: Secondary | ICD-10-CM | POA: Diagnosis not present

## 2022-02-07 ENCOUNTER — Telehealth: Payer: Self-pay | Admitting: Gastroenterology

## 2022-02-07 DIAGNOSIS — D126 Benign neoplasm of colon, unspecified: Secondary | ICD-10-CM

## 2022-02-07 NOTE — Telephone Encounter (Signed)
Dr. Vicente Males, I know that you sent me a message through his pathology report. Do you want me to schedule an appointment first and then send a referral after you see him or do I send a referral now. Please advise. ?

## 2022-02-07 NOTE — Progress Notes (Signed)
Gregory Mckay needs office visit to discuss results and next steps within a week please

## 2022-02-07 NOTE — Telephone Encounter (Signed)
Patient states he had a colonoscopy with Dr Vicente Males on 02/01/2022. States they removed multiple polyps but there was a large one that Dr Vicente Males recommended him to go to [facility] for. Patient is requesting for the referral to be sent over and requesting a call back when the referral is sent.  ?

## 2022-02-08 NOTE — Addendum Note (Signed)
Addended by: Wayna Chalet on: 02/08/2022 10:34 AM ? ? Modules accepted: Orders ? ?

## 2022-02-08 NOTE — Telephone Encounter (Signed)
Called patient to let him know that Dr. Vicente Males agreed on sending a referral to [facility] so they could evaluate patient and treat. ?

## 2022-02-09 DIAGNOSIS — E782 Mixed hyperlipidemia: Secondary | ICD-10-CM | POA: Diagnosis not present

## 2022-02-09 DIAGNOSIS — I4891 Unspecified atrial fibrillation: Secondary | ICD-10-CM | POA: Diagnosis not present

## 2022-02-09 DIAGNOSIS — I10 Essential (primary) hypertension: Secondary | ICD-10-CM | POA: Diagnosis not present

## 2022-02-09 DIAGNOSIS — R0789 Other chest pain: Secondary | ICD-10-CM | POA: Diagnosis not present

## 2022-02-11 NOTE — Telephone Encounter (Signed)
Patient has an appointment with [facility] on 02/24/2022.

## 2022-02-22 DIAGNOSIS — R0789 Other chest pain: Secondary | ICD-10-CM | POA: Diagnosis not present

## 2022-02-22 DIAGNOSIS — I4891 Unspecified atrial fibrillation: Secondary | ICD-10-CM | POA: Diagnosis not present

## 2022-02-23 DIAGNOSIS — R079 Chest pain, unspecified: Secondary | ICD-10-CM | POA: Diagnosis not present

## 2022-02-23 DIAGNOSIS — I4891 Unspecified atrial fibrillation: Secondary | ICD-10-CM | POA: Diagnosis not present

## 2022-03-02 DIAGNOSIS — I480 Paroxysmal atrial fibrillation: Secondary | ICD-10-CM | POA: Diagnosis not present

## 2022-03-02 DIAGNOSIS — R002 Palpitations: Secondary | ICD-10-CM | POA: Diagnosis not present

## 2022-03-02 DIAGNOSIS — I10 Essential (primary) hypertension: Secondary | ICD-10-CM | POA: Diagnosis not present

## 2022-03-02 DIAGNOSIS — E782 Mixed hyperlipidemia: Secondary | ICD-10-CM | POA: Diagnosis not present

## 2022-03-14 ENCOUNTER — Other Ambulatory Visit: Payer: Self-pay | Admitting: Family Medicine

## 2022-03-14 NOTE — Telephone Encounter (Signed)
Requested Prescriptions  Pending Prescriptions Disp Refills  . tadalafil (CIALIS) 5 MG tablet [Pharmacy Med Name: TADALAFIL 5 MG TAB] 90 tablet 3    Sig: TAKE 1 TABLET BY MOUTH ONCE DAILY     Urology: Erectile Dysfunction Agents Failed - 03/14/2022  8:04 AM      Failed - AST in normal range and within 360 days    AST  Date Value Ref Range Status  12/29/2021 13 (A) 14 - 40 Final   SGOT(AST)  Date Value Ref Range Status  12/14/2013 20 15 - 37 Unit/L Final         Passed - ALT in normal range and within 360 days    ALT  Date Value Ref Range Status  12/29/2021 18 10 - 40 U/L Final   SGPT (ALT)  Date Value Ref Range Status  12/14/2013 21 12 - 78 U/L Final         Passed - Last BP in normal range    BP Readings from Last 1 Encounters:  02/01/22 130/70         Passed - Valid encounter within last 12 months    Recent Outpatient Visits          2 months ago Encounter for annual wellness visit (AWV) in Medicare patient   [facility] [place], Dionne Bucy, MD   10 months ago Hypothyroidism, unspecified type   [facility], Dionne Bucy, MD   1 year ago Encounter for annual physical exam   [facility] [place], Dionne Bucy, MD   2 years ago Essential hypertension   [place], Dionne Bucy, MD   2 years ago Encounter for annual physical exam   [facility] Bacigalupo, Dionne Bucy, MD      Future Appointments            In 4 months Bacigalupo, Dionne Bucy, MD [facility], [place]

## 2022-03-16 DIAGNOSIS — D126 Benign neoplasm of colon, unspecified: Secondary | ICD-10-CM | POA: Diagnosis not present

## 2022-03-18 DIAGNOSIS — E7211 Homocystinuria: Secondary | ICD-10-CM | POA: Diagnosis not present

## 2022-03-18 DIAGNOSIS — I4891 Unspecified atrial fibrillation: Secondary | ICD-10-CM | POA: Diagnosis not present

## 2022-03-18 DIAGNOSIS — E789 Disorder of lipoprotein metabolism, unspecified: Secondary | ICD-10-CM | POA: Diagnosis not present

## 2022-03-24 DIAGNOSIS — I480 Paroxysmal atrial fibrillation: Secondary | ICD-10-CM | POA: Diagnosis not present

## 2022-03-25 DIAGNOSIS — D123 Benign neoplasm of transverse colon: Secondary | ICD-10-CM | POA: Diagnosis not present

## 2022-03-25 DIAGNOSIS — E039 Hypothyroidism, unspecified: Secondary | ICD-10-CM | POA: Diagnosis not present

## 2022-03-25 DIAGNOSIS — E785 Hyperlipidemia, unspecified: Secondary | ICD-10-CM | POA: Diagnosis not present

## 2022-03-25 DIAGNOSIS — I4891 Unspecified atrial fibrillation: Secondary | ICD-10-CM | POA: Diagnosis not present

## 2022-03-25 DIAGNOSIS — I10 Essential (primary) hypertension: Secondary | ICD-10-CM | POA: Diagnosis not present

## 2022-03-25 DIAGNOSIS — K635 Polyp of colon: Secondary | ICD-10-CM | POA: Diagnosis not present

## 2022-03-25 DIAGNOSIS — D126 Benign neoplasm of colon, unspecified: Secondary | ICD-10-CM | POA: Diagnosis not present

## 2022-03-30 ENCOUNTER — Other Ambulatory Visit: Payer: Self-pay | Admitting: Dermatology

## 2022-03-30 DIAGNOSIS — L738 Other specified follicular disorders: Secondary | ICD-10-CM

## 2022-03-31 DIAGNOSIS — E785 Hyperlipidemia, unspecified: Secondary | ICD-10-CM | POA: Diagnosis not present

## 2022-03-31 DIAGNOSIS — I129 Hypertensive chronic kidney disease with stage 1 through stage 4 chronic kidney disease, or unspecified chronic kidney disease: Secondary | ICD-10-CM | POA: Diagnosis not present

## 2022-03-31 DIAGNOSIS — K625 Hemorrhage of anus and rectum: Secondary | ICD-10-CM | POA: Diagnosis not present

## 2022-03-31 DIAGNOSIS — N1831 Chronic kidney disease, stage 3a: Secondary | ICD-10-CM | POA: Diagnosis not present

## 2022-03-31 DIAGNOSIS — I480 Paroxysmal atrial fibrillation: Secondary | ICD-10-CM | POA: Diagnosis not present

## 2022-03-31 DIAGNOSIS — Z86010 Personal history of colonic polyps: Secondary | ICD-10-CM | POA: Diagnosis not present

## 2022-03-31 DIAGNOSIS — Z7982 Long term (current) use of aspirin: Secondary | ICD-10-CM | POA: Diagnosis not present

## 2022-03-31 DIAGNOSIS — Z7722 Contact with and (suspected) exposure to environmental tobacco smoke (acute) (chronic): Secondary | ICD-10-CM | POA: Diagnosis not present

## 2022-03-31 DIAGNOSIS — Z79899 Other long term (current) drug therapy: Secondary | ICD-10-CM | POA: Diagnosis not present

## 2022-03-31 DIAGNOSIS — K635 Polyp of colon: Secondary | ICD-10-CM | POA: Diagnosis not present

## 2022-03-31 DIAGNOSIS — K91840 Postprocedural hemorrhage and hematoma of a digestive system organ or structure following a digestive system procedure: Secondary | ICD-10-CM | POA: Diagnosis not present

## 2022-03-31 DIAGNOSIS — Z9889 Other specified postprocedural states: Secondary | ICD-10-CM | POA: Diagnosis not present

## 2022-03-31 DIAGNOSIS — D126 Benign neoplasm of colon, unspecified: Secondary | ICD-10-CM | POA: Diagnosis not present

## 2022-03-31 DIAGNOSIS — E038 Other specified hypothyroidism: Secondary | ICD-10-CM | POA: Diagnosis not present

## 2022-03-31 DIAGNOSIS — I10 Essential (primary) hypertension: Secondary | ICD-10-CM | POA: Diagnosis not present

## 2022-03-31 DIAGNOSIS — D62 Acute posthemorrhagic anemia: Secondary | ICD-10-CM | POA: Diagnosis not present

## 2022-03-31 DIAGNOSIS — Z79890 Hormone replacement therapy (postmenopausal): Secondary | ICD-10-CM | POA: Diagnosis not present

## 2022-04-01 DIAGNOSIS — N189 Chronic kidney disease, unspecified: Secondary | ICD-10-CM | POA: Diagnosis not present

## 2022-04-01 DIAGNOSIS — I129 Hypertensive chronic kidney disease with stage 1 through stage 4 chronic kidney disease, or unspecified chronic kidney disease: Secondary | ICD-10-CM | POA: Diagnosis not present

## 2022-04-01 DIAGNOSIS — D126 Benign neoplasm of colon, unspecified: Secondary | ICD-10-CM | POA: Diagnosis not present

## 2022-04-01 DIAGNOSIS — K625 Hemorrhage of anus and rectum: Secondary | ICD-10-CM | POA: Diagnosis not present

## 2022-04-01 DIAGNOSIS — K91840 Postprocedural hemorrhage and hematoma of a digestive system organ or structure following a digestive system procedure: Secondary | ICD-10-CM | POA: Diagnosis not present

## 2022-04-14 DIAGNOSIS — K625 Hemorrhage of anus and rectum: Secondary | ICD-10-CM | POA: Diagnosis not present

## 2022-04-14 DIAGNOSIS — K635 Polyp of colon: Secondary | ICD-10-CM | POA: Diagnosis not present

## 2022-04-18 DIAGNOSIS — K625 Hemorrhage of anus and rectum: Secondary | ICD-10-CM | POA: Diagnosis not present

## 2022-04-18 DIAGNOSIS — K635 Polyp of colon: Secondary | ICD-10-CM | POA: Diagnosis not present

## 2022-05-26 DIAGNOSIS — N1831 Chronic kidney disease, stage 3a: Secondary | ICD-10-CM | POA: Diagnosis not present

## 2022-05-26 DIAGNOSIS — I10 Essential (primary) hypertension: Secondary | ICD-10-CM | POA: Diagnosis not present

## 2022-05-26 DIAGNOSIS — E782 Mixed hyperlipidemia: Secondary | ICD-10-CM | POA: Diagnosis not present

## 2022-05-26 DIAGNOSIS — I480 Paroxysmal atrial fibrillation: Secondary | ICD-10-CM | POA: Diagnosis not present

## 2022-06-06 ENCOUNTER — Other Ambulatory Visit: Payer: Self-pay | Admitting: Family Medicine

## 2022-06-06 DIAGNOSIS — E039 Hypothyroidism, unspecified: Secondary | ICD-10-CM

## 2022-06-29 ENCOUNTER — Ambulatory Visit (INDEPENDENT_AMBULATORY_CARE_PROVIDER_SITE_OTHER): Payer: Medicare Other

## 2022-06-29 DIAGNOSIS — Z23 Encounter for immunization: Secondary | ICD-10-CM

## 2022-07-15 ENCOUNTER — Ambulatory Visit (INDEPENDENT_AMBULATORY_CARE_PROVIDER_SITE_OTHER): Payer: Medicare Other | Admitting: Family Medicine

## 2022-07-15 ENCOUNTER — Encounter: Payer: Self-pay | Admitting: Family Medicine

## 2022-07-15 VITALS — BP 134/80 | HR 79 | Temp 98.4°F | Resp 16 | Wt 189.0 lb

## 2022-07-15 DIAGNOSIS — I10 Essential (primary) hypertension: Secondary | ICD-10-CM | POA: Diagnosis not present

## 2022-07-15 DIAGNOSIS — N1831 Chronic kidney disease, stage 3a: Secondary | ICD-10-CM | POA: Diagnosis not present

## 2022-07-15 DIAGNOSIS — E039 Hypothyroidism, unspecified: Secondary | ICD-10-CM | POA: Diagnosis not present

## 2022-07-15 DIAGNOSIS — E663 Overweight: Secondary | ICD-10-CM

## 2022-07-15 DIAGNOSIS — R972 Elevated prostate specific antigen [PSA]: Secondary | ICD-10-CM

## 2022-07-15 DIAGNOSIS — E785 Hyperlipidemia, unspecified: Secondary | ICD-10-CM

## 2022-07-15 DIAGNOSIS — R7303 Prediabetes: Secondary | ICD-10-CM

## 2022-07-15 LAB — POCT GLYCOSYLATED HEMOGLOBIN (HGB A1C)
Est. average glucose Bld gHb Est-mCnc: 103
Hemoglobin A1C: 5.2 % (ref 4.0–5.6)

## 2022-07-15 NOTE — Assessment & Plan Note (Signed)
Well controlled Continue current medications Recheck metabolic panel F/u in 6 months  

## 2022-07-15 NOTE — Assessment & Plan Note (Signed)
Chronic and stable Recheck metabolic panel Avoid nephrotoxic meds  

## 2022-07-15 NOTE — Assessment & Plan Note (Addendum)
Recommend low carb diet A1c normalized

## 2022-07-15 NOTE — Assessment & Plan Note (Signed)
Has previously seen urology and had negative MRI and biopsy PSA remains slightly elevated We will continue to trend and refer back to urology if it increases significantly

## 2022-07-15 NOTE — Progress Notes (Signed)
I,Sulibeya S Dimas,acting as a scribe for Lavon Paganini, MD.,have documented all relevant documentation on the behalf of Lavon Paganini, MD,as directed by  Lavon Paganini, MD while in the presence of Lavon Paganini, MD.     Established patient visit   Patient: Gregory Mckay   DOB: 09-12-1946   76 y.o. Male  MRN: [id number] Visit Date: 07/15/2022  Today's healthcare provider: Lavon Paganini, MD   Chief Complaint  Patient presents with   Hypertension   Hyperlipidemia   Hyperglycemia   Subjective      Prediabetes, Follow-up  Lab Results  Component Value Date   HGBA1C 5.5 12/29/2021   HGBA1C 5.7 (H) 05/10/2021   GLUCOSE 96 05/10/2021   GLUCOSE 100 (H) 09/10/2020   GLUCOSE 87 03/03/2020    Last seen for for this6 months ago.  Management since that visit includes no changes. Current symptoms include none and have been stable.  Prior visit with dietician: no Current diet: in general, a "healthy" diet   Current exercise: walking and weightlifting  Pertinent Labs:    Component Value Date/Time   CHOL 150 12/29/2021 0000   CHOL 126 09/10/2020 1033   TRIG 295 (A) 12/29/2021 0000   CHOLHDL 5.3 (H) 09/10/2020 1033   CREATININE 1.5 (A) 12/29/2021 0000   CREATININE 1.17 05/10/2021 0828   CREATININE 1.44 (H) 12/15/2013 0414    Wt Readings from Last 3 Encounters:  07/15/22 189 lb (85.7 kg)  02/01/22 185 lb (83.9 kg)  01/11/22 186 lb (84.4 kg)    -----------------------------------------------------------------------------------------  Hypertension, follow-up  BP Readings from Last 3 Encounters:  07/15/22 134/80  02/01/22 130/70  01/11/22 114/68   Wt Readings from Last 3 Encounters:  07/15/22 189 lb (85.7 kg)  02/01/22 185 lb (83.9 kg)  01/11/22 186 lb (84.4 kg)     He was last seen for hypertension 6 months ago.  BP at that visit was 130/70. Management since that visit includes no changes. He reports excellent compliance with treatment. He  is not having side effects.  He is exercising. He is adherent to low salt diet.   Outside blood pressures are stable.  He does not smoke.  Use of agents associated with hypertension: none.   --------------------------------------------------------------------------------------------------- Lipid/Cholesterol, follow-up  Last Lipid Panel: Lab Results  Component Value Date   CHOL 150 12/29/2021   LDLCALC 20 12/29/2021   HDL 20 (A) 12/29/2021   TRIG 295 (A) 12/29/2021    He was last seen for this 6 months ago.  Management since that visit includes no changes.  He reports excellent compliance with treatment. He is not having side effects.   Symptoms: No appetite changes No foot ulcerations  No chest pain No chest pressure/discomfort  No dyspnea No orthopnea  No fatigue No lower extremity edema  No palpitations No paroxysmal nocturnal dyspnea  No nausea No numbness or tingling of extremity  No polydipsia No polyuria  No speech difficulty No syncope     Last metabolic panel Lab Results  Component Value Date   GLUCOSE 96 05/10/2021   NA 140 12/29/2021   K 4.4 12/29/2021   BUN 31 (A) 12/29/2021   CREATININE 1.5 (A) 12/29/2021   EGFR 49 12/29/2021   GFRNONAA 50 (L) 09/10/2020   CALCIUM 10.4 12/29/2021   AST 13 (A) 12/29/2021   ALT 18 12/29/2021   The 10-year ASCVD risk score (Arnett DK, et al., 2019) is: 36.4%* (Cholesterol units were assumed)  ---------------------------------------------------------------------------------------------------   Medications: Outpatient Medications Prior to Visit  Medication Sig   amLODipine-benazepril (LOTREL) 5-10 MG capsule TAKE 1 CAPSULE BY MOUTH ONCE DAILY   ARMOUR THYROID 60 MG tablet TAKE ONE TABLET EVERY DAY BEFORE BREAKFAST   Biotin 5000 MCG CAPS Take 1 capsule by mouth daily. Taking at 1:00 am   Cholecalciferol (VITAMIN D3) 10000 units TABS Take 1 tablet by mouth daily. Taking at 1:00 am   Coenzyme Q10 (COQ-10) 50 MG  CAPS Take 50 mg by mouth daily. Taking at 8:58 am.   GARLIC PO Take 850 mg by mouth 2 (two) times daily. Aged Garlic Extract. Taking at 2:00 am and 2:00 pm   Gymnema Sylvestris Leaf POWD Take 400 mg by mouth 2 (two) times daily. Taking at 1:00 am and 1:00 pm   Levomefolate Glucosamine (METHYLFOLATE PO) Take 1,000 mcg by mouth daily. Taking at 1:00 am.   milk thistle 175 MG tablet Take 175 mg by mouth 2 (two) times daily. Taking at 1:00 am and 1:00 pm   Misc Natural Products (BETA-SITOSTEROL PLANT STEROLS) CAPS Take 60 mg by mouth 2 (two) times daily. Taking at 3:00 and 3:00 pm   OVER THE COUNTER MEDICATION Take 750 mg by mouth 2 (two) times daily. Curamed. Taking 1 tablet at 1:00 am, and 1 tablet at 1:00 pm.   PHOSPHATIDYLSERINE PO Take 300 mg by mouth daily. Taking at 1:00 am   RESVERATROL PO Take 500 mg by mouth daily. Taking at 1:00 am.   rosuvastatin (CRESTOR) 5 MG tablet Take 1 tablet (5 mg total) by mouth daily.   tadalafil (CIALIS) 5 MG tablet TAKE 1 TABLET BY MOUTH ONCE DAILY   testosterone (ANDROGEL) 50 MG/5GM (1%) GEL Place 5 g onto the skin daily.   tretinoin (RETIN-A) 0.1 % cream APPLY TO FACE AT BEDTIME AND WASH OFF IN THE MORNING   triamcinolone ointment (KENALOG) 0.5 % Apply 1 application topically 2 (two) times daily.   vitamin E 400 UNIT capsule Take 400 Units by mouth daily. Taking at 1:00 am   Vitamin Mixture (ESTER-C PO) Take 1,000 mg by mouth daily. Taking at 1:00 am   No facility-administered medications prior to visit.    Review of Systems  Constitutional:  Negative for activity change, appetite change, chills, fatigue, fever and unexpected weight change.  HENT:  Negative for ear pain, sinus pressure, sinus pain and sore throat.   Eyes:  Negative for pain and visual disturbance.  Respiratory:  Negative for cough, chest tightness, shortness of breath and wheezing.   Cardiovascular:  Negative for chest pain, palpitations and leg swelling.  Gastrointestinal:  Negative  for abdominal pain, blood in stool, diarrhea, nausea and vomiting.  Genitourinary:  Negative for flank pain, frequency and urgency.  Musculoskeletal:  Negative for back pain, myalgias and neck pain.  Neurological:  Negative for dizziness, weakness, light-headedness and headaches.       Objective    BP 134/80 (BP Location: Left Arm, Patient Position: Sitting, Cuff Size: Large)   Pulse 79   Temp 98.4 F (36.9 C) (Oral)   Resp 16   Wt 189 lb (85.7 kg)   BMI 26.36 kg/m    Physical Exam Vitals reviewed.  Constitutional:      General: He is not in acute distress.    Appearance: Normal appearance. He is not diaphoretic.  HENT:     Head: Normocephalic and atraumatic.  Eyes:     General: No scleral icterus.    Conjunctiva/sclera: Conjunctivae normal.  Cardiovascular:     Rate and Rhythm: Normal  rate and regular rhythm.     Pulses: Normal pulses.     Heart sounds: Normal heart sounds. No murmur heard. Pulmonary:     Effort: Pulmonary effort is normal. No respiratory distress.     Breath sounds: Normal breath sounds. No wheezing or rhonchi.  Musculoskeletal:     Cervical back: Neck supple.     Right lower leg: No edema.     Left lower leg: No edema.  Lymphadenopathy:     Cervical: No cervical adenopathy.  Skin:    General: Skin is warm and dry.     Findings: No rash.  Neurological:     Mental Status: He is alert and oriented to person, place, and time. Mental status is at baseline.  Psychiatric:        Mood and Affect: Mood normal.        Behavior: Behavior normal.       No results found for any visits on 07/15/22.  Assessment & Plan     Problem List Items Addressed This Visit       Cardiovascular and Mediastinum   Hypertension - Primary    Well controlled Continue current medications Recheck metabolic panel F/u in 6 months       Relevant Orders   Basic Metabolic Panel (BMET)     Endocrine   Hypothyroidism    Reviewed last TSH - wnl Repeat today No  changes to meds pending results      Relevant Orders   TSH     Genitourinary   Stage 3a chronic kidney disease (HCC)    Chronic and stable Recheck metabolic panel Avoid nephrotoxic meds       Relevant Orders   TSH     Other   Elevated PSA    Has previously seen urology and had negative MRI and biopsy PSA remains slightly elevated We will continue to trend and refer back to urology if it increases significantly      Relevant Orders   PSA Total (Reflex To Free)   Hyperlipidemia    Previously well controlled Continue statin Repeat FLP and CMP annually      Overweight    Discussed importance of healthy weight management Discussed diet and exercise       Prediabetes    Recommend low carb diet A1c normalized      Relevant Orders   POCT glycosylated hemoglobin (Hb A1C)     Return in about 6 months (around 01/14/2023) for AWV, CPE.      I, Lavon Paganini, MD, have reviewed all documentation for this visit. The documentation on 07/15/22 for the exam, diagnosis, procedures, and orders are all accurate and complete.   Gilmar Bua, Dionne Bucy, MD, MPH [facility]

## 2022-07-15 NOTE — Assessment & Plan Note (Signed)
Previously well controlled Continue statin Repeat FLP and CMP annually 

## 2022-07-15 NOTE — Assessment & Plan Note (Signed)
Discussed importance of healthy weight management Discussed diet and exercise  

## 2022-07-15 NOTE — Assessment & Plan Note (Signed)
Reviewed last TSH - wnl Repeat today No changes to meds pending results

## 2022-07-16 LAB — PSA TOTAL (REFLEX TO FREE): Prostate Specific Ag, Serum: 12.1 ng/mL — ABNORMAL HIGH (ref 0.0–4.0)

## 2022-07-16 LAB — BASIC METABOLIC PANEL
BUN/Creatinine Ratio: 14 (ref 10–24)
BUN: 19 mg/dL (ref 8–27)
CO2: 24 mmol/L (ref 20–29)
Calcium: 9.6 mg/dL (ref 8.6–10.2)
Chloride: 105 mmol/L (ref 96–106)
Creatinine, Ser: 1.33 mg/dL — ABNORMAL HIGH (ref 0.76–1.27)
Glucose: 97 mg/dL (ref 70–99)
Potassium: 4.4 mmol/L (ref 3.5–5.2)
Sodium: 140 mmol/L (ref 134–144)
eGFR: 56 mL/min/{1.73_m2} — ABNORMAL LOW (ref >59)

## 2022-07-16 LAB — TSH: TSH: 2.53 u[IU]/mL (ref 0.450–4.500)

## 2022-07-28 ENCOUNTER — Other Ambulatory Visit: Payer: Self-pay | Admitting: Family Medicine

## 2022-07-28 NOTE — Telephone Encounter (Signed)
Medication Refill - Medication: testosterone (ANDROGEL) 50 MG/5GM (1%) GEL   Has the patient contacted their pharmacy? Yes.    (Agent: If yes, when and what did the pharmacy advise?)  Preferred Pharmacy (with phone number or street name):  [place], [place] - [place]  [place] [address]  Phone: [phone] Fax: [phone]  Hours: Not open 24 hours   Has the patient been seen for an appointment in the last year OR does the patient have an upcoming appointment? Yes.    Agent: Please be advised that RX refills may take up to 3 business days. We ask that you follow-up with your pharmacy.

## 2022-07-28 NOTE — Telephone Encounter (Signed)
Requested medication (s) are due for refill today: historical medication  Requested medication (s) are on the active medication list: yes  Last refill:  na  Future visit scheduled: yes in 6 months  Notes to clinic:  historical medication. Medication not assigned to a protocol. Do you want to order Rx?     Requested Prescriptions  Pending Prescriptions Disp Refills   testosterone (ANDROGEL) 50 MG/5GM (1%) GEL      Sig: Place 5 g onto the skin daily.     Off-Protocol Failed - 07/28/2022  3:35 PM      Failed - Medication not assigned to a protocol, review manually.      Passed - Valid encounter within last 12 months    Recent Outpatient Visits           1 week ago Primary hypertension   [place], Dionne Bucy, MD   6 months ago Encounter for annual wellness visit (AWV) in Medicare patient   [facility] [place], Dionne Bucy, MD   1 year ago Hypothyroidism, unspecified type   [facility], Dionne Bucy, MD   1 year ago Encounter for annual physical exam   [facility], Dionne Bucy, MD   2 years ago Essential hypertension   [place], Dionne Bucy, MD       Future Appointments             In 6 months Bacigalupo, Dionne Bucy, MD [facility], [place]

## 2022-08-01 ENCOUNTER — Ambulatory Visit: Payer: Self-pay | Admitting: *Deleted

## 2022-08-01 NOTE — Telephone Encounter (Signed)
Please Review

## 2022-08-01 NOTE — Telephone Encounter (Signed)
  Chief Complaint: requesting medication refill for testosterone (androgel) 50 mg 1 % gel  Symptoms: feeling tired. Has run out of medication and reports he can notice the difference how he is feeling  Frequency: requested medication 07/28/22 Pertinent Negatives: Patient denies na Disposition: '[]'$ ED /'[]'$ Urgent Care (no appt availability in office) / '[]'$ Appointment(In office/virtual)/ '[]'$  [place] [facility]/ '[]'$ Home Care/ '[]'$ Refused Recommended Disposition /'[]'$ [facility] [place] [facility]/ '[x]'$  Follow-up with PCP Additional Notes:   Please advise if Rx can be called in to pharmacy . Rx historical provider. Patient reports Dr. Jacinto Reap has prescribed medication before.  Patient reports he contacted pharmacy Saturday and no medication has been refilled. Patient would like call back [phone].     Reason for Disposition  [1] Prescription refill request for NON-ESSENTIAL medicine (i.e., no harm to patient if med not taken) AND [2] triager unable to refill per department policy  Answer Assessment - Initial Assessment Questions 1. DRUG NAME: "What medicine do you need to have refilled?"     Testosterone (androgel) 50 mg 1% gel 2. REFILLS REMAINING: "How many refills are remaining?" (Note: The label on the medicine or pill bottle will show how many refills are remaining. If there are no refills remaining, then a renewal may be needed.)     na 3. EXPIRATION DATE: "What is the expiration date?" (Note: The label states when the prescription will expire, and thus can no longer be refilled.)     na 4. PRESCRIBING HCP: "Who prescribed it?" Reason: If prescribed by specialist, call should be referred to that group.     Historical provider  5. SYMPTOMS: "Do you have any symptoms?"     Starting to feel very tired 6. PREGNANCY: "Is there any chance that you are pregnant?" "When was your last menstrual period?"     na  Protocols used: Medication Refill and Renewal Call-A-AH

## 2022-08-01 NOTE — Telephone Encounter (Signed)
Spoke with patient and advised

## 2022-08-01 NOTE — Telephone Encounter (Signed)
I have never Rx'd this for him. He was managed before by Urology. I do not Rx testosterone therapy. We can re-refer to Urology for continued therapy if he'd like.

## 2022-08-23 DIAGNOSIS — N1831 Chronic kidney disease, stage 3a: Secondary | ICD-10-CM | POA: Diagnosis not present

## 2022-08-23 DIAGNOSIS — D124 Benign neoplasm of descending colon: Secondary | ICD-10-CM | POA: Diagnosis not present

## 2022-08-23 DIAGNOSIS — D125 Benign neoplasm of sigmoid colon: Secondary | ICD-10-CM | POA: Diagnosis not present

## 2022-08-23 DIAGNOSIS — D122 Benign neoplasm of ascending colon: Secondary | ICD-10-CM | POA: Diagnosis not present

## 2022-08-23 DIAGNOSIS — Z79899 Other long term (current) drug therapy: Secondary | ICD-10-CM | POA: Diagnosis not present

## 2022-08-23 DIAGNOSIS — D123 Benign neoplasm of transverse colon: Secondary | ICD-10-CM | POA: Diagnosis not present

## 2022-08-23 DIAGNOSIS — Z86010 Personal history of colonic polyps: Secondary | ICD-10-CM | POA: Diagnosis not present

## 2022-08-23 DIAGNOSIS — E782 Mixed hyperlipidemia: Secondary | ICD-10-CM | POA: Diagnosis not present

## 2022-08-23 DIAGNOSIS — I129 Hypertensive chronic kidney disease with stage 1 through stage 4 chronic kidney disease, or unspecified chronic kidney disease: Secondary | ICD-10-CM | POA: Diagnosis not present

## 2022-08-23 DIAGNOSIS — K6389 Other specified diseases of intestine: Secondary | ICD-10-CM | POA: Diagnosis not present

## 2022-08-23 DIAGNOSIS — D490 Neoplasm of unspecified behavior of digestive system: Secondary | ICD-10-CM | POA: Diagnosis not present

## 2022-08-23 DIAGNOSIS — K635 Polyp of colon: Secondary | ICD-10-CM | POA: Diagnosis not present

## 2022-08-23 DIAGNOSIS — Z1211 Encounter for screening for malignant neoplasm of colon: Secondary | ICD-10-CM | POA: Diagnosis not present

## 2022-08-24 DIAGNOSIS — M9903 Segmental and somatic dysfunction of lumbar region: Secondary | ICD-10-CM | POA: Diagnosis not present

## 2022-08-24 DIAGNOSIS — M62830 Muscle spasm of back: Secondary | ICD-10-CM | POA: Diagnosis not present

## 2022-08-24 DIAGNOSIS — M5416 Radiculopathy, lumbar region: Secondary | ICD-10-CM | POA: Diagnosis not present

## 2022-08-24 DIAGNOSIS — M5417 Radiculopathy, lumbosacral region: Secondary | ICD-10-CM | POA: Diagnosis not present

## 2022-08-26 DIAGNOSIS — M5416 Radiculopathy, lumbar region: Secondary | ICD-10-CM | POA: Diagnosis not present

## 2022-08-26 DIAGNOSIS — M5417 Radiculopathy, lumbosacral region: Secondary | ICD-10-CM | POA: Diagnosis not present

## 2022-08-26 DIAGNOSIS — M62830 Muscle spasm of back: Secondary | ICD-10-CM | POA: Diagnosis not present

## 2022-08-26 DIAGNOSIS — M9903 Segmental and somatic dysfunction of lumbar region: Secondary | ICD-10-CM | POA: Diagnosis not present

## 2022-08-29 DIAGNOSIS — D375 Neoplasm of uncertain behavior of rectum: Secondary | ICD-10-CM | POA: Diagnosis not present

## 2022-09-05 DIAGNOSIS — K6389 Other specified diseases of intestine: Secondary | ICD-10-CM | POA: Diagnosis not present

## 2022-09-05 DIAGNOSIS — N1831 Chronic kidney disease, stage 3a: Secondary | ICD-10-CM | POA: Diagnosis not present

## 2022-09-05 DIAGNOSIS — Z01818 Encounter for other preprocedural examination: Secondary | ICD-10-CM | POA: Diagnosis not present

## 2022-09-05 DIAGNOSIS — K625 Hemorrhage of anus and rectum: Secondary | ICD-10-CM | POA: Diagnosis not present

## 2022-09-05 DIAGNOSIS — E039 Hypothyroidism, unspecified: Secondary | ICD-10-CM | POA: Diagnosis not present

## 2022-09-05 DIAGNOSIS — I129 Hypertensive chronic kidney disease with stage 1 through stage 4 chronic kidney disease, or unspecified chronic kidney disease: Secondary | ICD-10-CM | POA: Diagnosis not present

## 2022-09-05 DIAGNOSIS — Z01812 Encounter for preprocedural laboratory examination: Secondary | ICD-10-CM | POA: Diagnosis not present

## 2022-09-05 DIAGNOSIS — Z01810 Encounter for preprocedural cardiovascular examination: Secondary | ICD-10-CM | POA: Diagnosis not present

## 2022-09-05 DIAGNOSIS — I480 Paroxysmal atrial fibrillation: Secondary | ICD-10-CM | POA: Diagnosis not present

## 2022-09-05 DIAGNOSIS — Z79899 Other long term (current) drug therapy: Secondary | ICD-10-CM | POA: Diagnosis not present

## 2022-09-05 DIAGNOSIS — E782 Mixed hyperlipidemia: Secondary | ICD-10-CM | POA: Diagnosis not present

## 2022-09-05 DIAGNOSIS — D649 Anemia, unspecified: Secondary | ICD-10-CM | POA: Diagnosis not present

## 2022-09-05 DIAGNOSIS — D126 Benign neoplasm of colon, unspecified: Secondary | ICD-10-CM | POA: Diagnosis not present

## 2022-09-06 ENCOUNTER — Other Ambulatory Visit: Payer: Self-pay | Admitting: Family Medicine

## 2022-09-06 DIAGNOSIS — E039 Hypothyroidism, unspecified: Secondary | ICD-10-CM

## 2022-09-06 NOTE — Telephone Encounter (Signed)
Requested Prescriptions  Pending Prescriptions Disp Refills   ARMOUR THYROID 60 MG tablet [Pharmacy Med Name: ARMOUR THYROID 60 MG TAB] 90 tablet 0    Sig: TAKE ONE TABLET EVERY DAY BEFORE BREAKFAST     Endocrinology:  Hypothyroid Agents Passed - 09/06/2022 10:50 AM      Passed - TSH in normal range and within 360 days    TSH  Date Value Ref Range Status  07/15/2022 2.530 0.450 - 4.500 uIU/mL Final         Passed - Valid encounter within last 12 months    Recent Outpatient Visits           1 month ago Primary hypertension   [place], Dionne Bucy, MD   7 months ago Encounter for annual wellness visit (AWV) in Medicare patient   [facility] [place], Dionne Bucy, MD   1 year ago Hypothyroidism, unspecified type   [facility], Dionne Bucy, MD   1 year ago Encounter for annual physical exam   [facility] [place], Dionne Bucy, MD   2 years ago Essential hypertension   [place], Dionne Bucy, MD       Future Appointments             In 4 months Bacigalupo, Dionne Bucy, MD [facility], [place]

## 2022-09-10 DIAGNOSIS — K6389 Other specified diseases of intestine: Secondary | ICD-10-CM | POA: Diagnosis not present

## 2022-09-10 DIAGNOSIS — K639 Disease of intestine, unspecified: Secondary | ICD-10-CM | POA: Diagnosis not present

## 2022-09-10 DIAGNOSIS — J9811 Atelectasis: Secondary | ICD-10-CM | POA: Diagnosis not present

## 2022-09-10 DIAGNOSIS — N289 Disorder of kidney and ureter, unspecified: Secondary | ICD-10-CM | POA: Diagnosis not present

## 2022-09-10 DIAGNOSIS — K625 Hemorrhage of anus and rectum: Secondary | ICD-10-CM | POA: Diagnosis not present

## 2022-09-16 DIAGNOSIS — K6389 Other specified diseases of intestine: Secondary | ICD-10-CM | POA: Diagnosis not present

## 2022-09-16 DIAGNOSIS — C182 Malignant neoplasm of ascending colon: Secondary | ICD-10-CM | POA: Diagnosis not present

## 2022-09-16 DIAGNOSIS — I10 Essential (primary) hypertension: Secondary | ICD-10-CM | POA: Diagnosis not present

## 2022-09-16 DIAGNOSIS — Z79899 Other long term (current) drug therapy: Secondary | ICD-10-CM | POA: Diagnosis not present

## 2022-09-16 DIAGNOSIS — E785 Hyperlipidemia, unspecified: Secondary | ICD-10-CM | POA: Diagnosis not present

## 2022-09-16 DIAGNOSIS — D123 Benign neoplasm of transverse colon: Secondary | ICD-10-CM | POA: Diagnosis not present

## 2022-09-21 ENCOUNTER — Telehealth: Payer: Self-pay | Admitting: *Deleted

## 2022-09-21 NOTE — Patient Outreach (Signed)
  Care Coordination [facility] Note Transition Care Management Follow-up Telephone Call Date of discharge and from where: [facility] [id number] How have you been since you were released from the hospital? IU am doing pretty good. I am a little sore. Any questions or concerns? Yes Patient wanted to know when he could start weight lifting again. RN read the notes about no lifting anything greater than 10 pounds for 6 weeks. Also the complications that can occur if not following the Dr order. RN discussed showering and patting the incision. RN discussed walking 10 min 3x a day. Wife had concerns about patient is not using any pain medication and the tylenol is all he needs since being home. Does he have to take the pain narcotic medication. RN explained that the Dr was wanting to stay ahead of the pain, but if he is comfortable that is ok.   Items Reviewed: Did the pt receive and understand the discharge instructions provided? No  Medications obtained and verified? Yes  Other? No  Any new allergies since your discharge? No  Dietary orders reviewed? No Do you have support at home? Yes   Home Care and Equipment/Supplies: Were home health services ordered? not applicable If so, what is the name of the agency? n  Has the agency set up a time to come to the patient's home? no Were any new equipment or medical supplies ordered?  No What is the name of the medical supply agency? N/a Were you able to get the supplies/equipment? not applicable Do you have any questions related to the use of the equipment or supplies? No  Functional Questionnaire: (I = Independent and D = Dependent) ADLs: I  Bathing/Dressing- I  Meal Prep- D  Eating- I  Maintaining continence- I  Transferring/Ambulation- I  Managing Meds- I  Follow up appointments reviewed:  PCP Hospital f/u appt confirmed? No  Patient will schedule appointment [facility] f/u appt confirmed? No  . Are transportation arrangements needed? No   If their condition worsens, is the pt aware to call PCP or go to the Emergency Dept.? Yes Was the patient provided with contact information for the PCP's office or ED? Yes Was to pt encouraged to call back with questions or concerns? Yes  SDOH assessments and interventions completed:   Yes SDOH Interventions Today    Flowsheet Row Most Recent Value  SDOH Interventions   Food Insecurity Interventions Intervention Not Indicated  Housing Interventions Intervention Not Indicated  Transportation Interventions Intervention Not Indicated       Care Coordination Interventions:  RN discussed wd care, ambulating and medications    Encounter Outcome:  Pt. Visit Completed    [facility] [phone]

## 2022-10-06 DIAGNOSIS — E785 Hyperlipidemia, unspecified: Secondary | ICD-10-CM | POA: Diagnosis not present

## 2022-10-06 DIAGNOSIS — E782 Mixed hyperlipidemia: Secondary | ICD-10-CM | POA: Diagnosis not present

## 2022-10-06 DIAGNOSIS — I10 Essential (primary) hypertension: Secondary | ICD-10-CM | POA: Diagnosis not present

## 2022-10-06 DIAGNOSIS — I480 Paroxysmal atrial fibrillation: Secondary | ICD-10-CM | POA: Diagnosis not present

## 2022-10-17 DIAGNOSIS — C182 Malignant neoplasm of ascending colon: Secondary | ICD-10-CM | POA: Diagnosis not present

## 2022-10-23 DIAGNOSIS — I10 Essential (primary) hypertension: Secondary | ICD-10-CM | POA: Diagnosis not present

## 2022-10-23 DIAGNOSIS — K5989 Other specified functional intestinal disorders: Secondary | ICD-10-CM | POA: Diagnosis not present

## 2022-10-23 DIAGNOSIS — R1084 Generalized abdominal pain: Secondary | ICD-10-CM | POA: Diagnosis not present

## 2022-10-23 DIAGNOSIS — Z79899 Other long term (current) drug therapy: Secondary | ICD-10-CM | POA: Diagnosis not present

## 2022-10-23 DIAGNOSIS — Z9049 Acquired absence of other specified parts of digestive tract: Secondary | ICD-10-CM | POA: Diagnosis not present

## 2022-10-23 DIAGNOSIS — K56609 Unspecified intestinal obstruction, unspecified as to partial versus complete obstruction: Secondary | ICD-10-CM | POA: Diagnosis not present

## 2022-10-23 DIAGNOSIS — E039 Hypothyroidism, unspecified: Secondary | ICD-10-CM | POA: Diagnosis not present

## 2022-10-23 DIAGNOSIS — I959 Hypotension, unspecified: Secondary | ICD-10-CM | POA: Diagnosis not present

## 2022-10-23 DIAGNOSIS — I4891 Unspecified atrial fibrillation: Secondary | ICD-10-CM | POA: Diagnosis not present

## 2022-10-23 DIAGNOSIS — Z85038 Personal history of other malignant neoplasm of large intestine: Secondary | ICD-10-CM | POA: Diagnosis not present

## 2022-10-23 DIAGNOSIS — K56699 Other intestinal obstruction unspecified as to partial versus complete obstruction: Secondary | ICD-10-CM | POA: Diagnosis not present

## 2022-10-23 DIAGNOSIS — Z4659 Encounter for fitting and adjustment of other gastrointestinal appliance and device: Secondary | ICD-10-CM | POA: Diagnosis not present

## 2022-10-23 DIAGNOSIS — E785 Hyperlipidemia, unspecified: Secondary | ICD-10-CM | POA: Diagnosis not present

## 2022-10-23 DIAGNOSIS — K56600 Partial intestinal obstruction, unspecified as to cause: Secondary | ICD-10-CM | POA: Diagnosis not present

## 2022-10-31 ENCOUNTER — Telehealth: Payer: Self-pay | Admitting: *Deleted

## 2022-10-31 NOTE — Progress Notes (Signed)
  Care Coordination  Note  10/31/2022 Name: Gregory Mckay MRN: [id number] DOB: Mar 18, 1946  Gregory Mckay is a 77 y.o. year old primary care patient of Brita Romp, Dionne Bucy, MD. I reached out to Gwenith Spitz by phone today to assist with scheduling a follow up appointment. Gwenith Spitz verbally consented to my assistance.       Follow up plan: Hospital Follow Up appointment scheduled with (Dr Brita Romp) on (11/01/2022) at (3pm).  Julian Hy, [place] Direct Dial: [phone]

## 2022-10-31 NOTE — Patient Outreach (Signed)
  Care Coordination [facility] Note Transition Care Management Follow-up Telephone Call Date of discharge and from where: [facility] [id number] How have you been since you were released from the hospital? Doing ok. A little weak and dizzy. Any questions or concerns? Yes You should not lift more than ~10 lbs (about a milk jug) for the next 6 weeks.  It is important that you continue walking at least 3 times a day, at least 10 minutes per walk (as tolerated). This will help prevent blood clots in your legs as well as help prevent an pneumonia by keeping your lungs expanded.  Items Reviewed: Did the pt receive and understand the discharge instructions provided? Yes  Medications obtained and verified? Yes   Other? No  Any new allergies since your discharge? No  Dietary orders reviewed? No Do you have support at home? Yes   Home Care and Equipment/Supplies: Were home health services ordered? no If so, what is the name of the agency? N/a  Has the agency set up a time to come to the patient's home? not applicable Were any new equipment or medical supplies ordered?  No What is the name of the medical supply agency? N  Were you able to get the supplies/equipment? not applicable Do you have any questions related to the use of the equipment or supplies? No  Functional Questionnaire: (I = Independent and D = Dependent) ADLs: D  Bathing/Dressing- D  Meal Prep- D  Eating- I  Maintaining continence- I  Transferring/Ambulation- I  Managing Meds- I  Follow up appointments reviewed:  PCP Hospital f/u appt confirmed? Yes   Dr Brita Romp [id number] 3:00  [facility] f/u appt confirmed? Yes  02/14/2023 11:30 AM Janice Coffin  Are transportation arrangements needed? No  If their condition worsens, is the pt aware to call PCP or go to the Emergency Dept.? Yes Was the patient provided with contact information for the PCP's office or ED? Yes Was to pt encouraged to call back with questions or  concerns? Yes  SDOH assessments and interventions completed:   Yes   Care Coordination Interventions:  PCP follow up appointment requested   Encounter Outcome:  Pt. Visit Completed

## 2022-11-01 ENCOUNTER — Ambulatory Visit (INDEPENDENT_AMBULATORY_CARE_PROVIDER_SITE_OTHER): Payer: Medicare Other | Admitting: Family Medicine

## 2022-11-01 ENCOUNTER — Encounter: Payer: Self-pay | Admitting: Family Medicine

## 2022-11-01 VITALS — BP 138/84 | HR 69 | Temp 97.9°F | Resp 16 | Wt 172.0 lb

## 2022-11-01 DIAGNOSIS — K56609 Unspecified intestinal obstruction, unspecified as to partial versus complete obstruction: Secondary | ICD-10-CM

## 2022-11-01 DIAGNOSIS — I10 Essential (primary) hypertension: Secondary | ICD-10-CM | POA: Diagnosis not present

## 2022-11-01 DIAGNOSIS — C182 Malignant neoplasm of ascending colon: Secondary | ICD-10-CM | POA: Diagnosis not present

## 2022-11-01 NOTE — Assessment & Plan Note (Signed)
S/p R hemicolectomy  F/b colorectal surgeon  Continue miralax daily No heavy lifting

## 2022-11-01 NOTE — Assessment & Plan Note (Signed)
Recent hospitalization Doing better with soft and non-tender abd on exam At the end of the week, ok to advance diet slowly - discussed soft/bland foods first and slowly increase from there Return precautions discussed

## 2022-11-01 NOTE — Progress Notes (Signed)
I,Sulibeya S Dimas,acting as a scribe for Lavon Paganini, MD.,have documented all relevant documentation on the behalf of Lavon Paganini, MD,as directed by  Lavon Paganini, MD while in the presence of Lavon Paganini, MD.     Established patient visit   Patient: Gregory Mckay   DOB: 08/24/1946   77 y.o. Male  MRN: [id number] Visit Date: 11/01/2022  Today's healthcare provider: Lavon Paganini, MD   Chief Complaint  Patient presents with   Hospitalization Follow-up   Subjective    HPI  Follow up Hospitalization  Patient was admitted to [facility] on 10/23/22 and discharged on 10/28/22. He was treated for small bowel obstruction. Treatment for this included patient was hypotensive to systolic of 16B and received a 1L LR bolus with an appropriate response. Otherwise lab work was normal. CT scan showed evidence of dilated loops of small bowel with no clear transition point. Continue senokot and miralax.   Hydrating well and no longer dizzy. Able to walk 1 mile per day Telephone follow up was done on 10/31/22 He reports excellent compliance with treatment. He reports this condition is resolved.   Colon cancer s/p R hemicolectomy 10/17/22, back to ED with abd pain diagnosed with SBO  He was treated using non-operative management with NG decompression and Gastroview challenge. The patient was ambulating independently; voiding independently, tolerating a full liquid diet without nausea or vomiting, and subsequently deemed appropriate for discharge.   Once follow-up arrangements were confirmed, he was felt suitable for discharge to home on 10/28/2022 with the following recommendations:  1) No heavy lifting >10 pounds for 4-6 weeks. Showering is okay. Do not submerge in water such as for bathing or in a pool or lake for 4 weeks.  2) Pain control medications with the following: Tylenol and ibuprofen.  3) Continue senokot and miralax for bowel regimen.  4) Follow-up  appointment with Dr. Ebony Hail to be scheduled 4 weeks.  5) Full liquid diet for 7 days and then transition to regular diet as tolerated    Taking miralax 17 g daily. No further abd pain ----------------------------------------------------------------------------------------- -   Medications: Outpatient Medications Prior to Visit  Medication Sig   amLODipine-benazepril (LOTREL) 5-10 MG capsule TAKE 1 CAPSULE BY MOUTH ONCE DAILY   ARMOUR THYROID 60 MG tablet TAKE ONE TABLET EVERY DAY BEFORE BREAKFAST   Biotin 5000 MCG CAPS Take 1 capsule by mouth daily. Taking at 1:00 am   Cholecalciferol (VITAMIN D3) 10000 units TABS Take 1 tablet by mouth daily. Taking at 1:00 am   Coenzyme Q10 (COQ-10) 50 MG CAPS Take 50 mg by mouth daily. Taking at 8:46 am.   GARLIC PO Take 659 mg by mouth 2 (two) times daily. Aged Garlic Extract. Taking at 2:00 am and 2:00 pm   Gymnema Sylvestris Leaf POWD Take 400 mg by mouth 2 (two) times daily. Taking at 1:00 am and 1:00 pm   Levomefolate Glucosamine (METHYLFOLATE PO) Take 1,000 mcg by mouth daily. Taking at 1:00 am.   milk thistle 175 MG tablet Take 175 mg by mouth 2 (two) times daily. Taking at 1:00 am and 1:00 pm   Misc Natural Products (BETA-SITOSTEROL PLANT STEROLS) CAPS Take 60 mg by mouth 2 (two) times daily. Taking at 3:00 and 3:00 pm   OVER THE COUNTER MEDICATION Take 750 mg by mouth 2 (two) times daily. Curamed. Taking 1 tablet at 1:00 am, and 1 tablet at 1:00 pm.   PHOSPHATIDYLSERINE PO Take 300 mg by mouth daily. Taking at  1:00 am   RESVERATROL PO Take 500 mg by mouth daily. Taking at 1:00 am.   rosuvastatin (CRESTOR) 5 MG tablet Take 1 tablet (5 mg total) by mouth daily.   tadalafil (CIALIS) 5 MG tablet TAKE 1 TABLET BY MOUTH ONCE DAILY   testosterone (ANDROGEL) 50 MG/5GM (1%) GEL Place 5 g onto the skin daily.   tretinoin (RETIN-A) 0.1 % cream APPLY TO FACE AT BEDTIME AND WASH OFF IN THE MORNING   triamcinolone ointment (KENALOG) 0.5 % Apply 1  application topically 2 (two) times daily.   vitamin E 400 UNIT capsule Take 400 Units by mouth daily. Taking at 1:00 am   Vitamin Mixture (ESTER-C PO) Take 1,000 mg by mouth daily. Taking at 1:00 am   No facility-administered medications prior to visit.    Review of Systems  Constitutional:  Positive for appetite change. Negative for chills and fever.  Respiratory:  Negative for cough, chest tightness and shortness of breath.   Cardiovascular:  Negative for chest pain.  Gastrointestinal:  Negative for abdominal pain, constipation, diarrhea, nausea and vomiting.       Objective    BP 138/84 (BP Location: Left Arm, Patient Position: Sitting, Cuff Size: Large)   Pulse 69   Temp 97.9 F (36.6 C) (Temporal)   Resp 16   Wt 172 lb (78 kg) Comment: per pt  SpO2 98%   BMI 23.99 kg/m    Physical Exam Vitals reviewed.  Constitutional:      General: He is not in acute distress.    Appearance: Normal appearance. He is not diaphoretic.  HENT:     Head: Normocephalic and atraumatic.  Eyes:     General: No scleral icterus.    Conjunctiva/sclera: Conjunctivae normal.  Cardiovascular:     Rate and Rhythm: Normal rate and regular rhythm.     Heart sounds: Normal heart sounds.  Pulmonary:     Effort: Pulmonary effort is normal. No respiratory distress.     Breath sounds: Normal breath sounds. No wheezing or rhonchi.  Abdominal:     General: Bowel sounds are normal. There is no distension.     Palpations: Abdomen is soft.     Tenderness: There is no abdominal tenderness.     Comments: Incisions are healing well, c/d/i  Musculoskeletal:     Right lower leg: No edema.     Left lower leg: No edema.  Skin:    General: Skin is warm and dry.     Findings: No rash.  Neurological:     Mental Status: He is alert and oriented to person, place, and time. Mental status is at baseline.  Psychiatric:        Mood and Affect: Mood normal.        Behavior: Behavior normal.       No results  found for any visits on 11/01/22.  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      I, Lavon Paganini, MD, have reviewed all documentation for this visit. The documentation on 11/01/22 for the exam, diagnosis, procedures, and orders are all accurate and complete.   Khris Jansson, Dionne Bucy, MD, MPH [facility]

## 2022-11-01 NOTE — Assessment & Plan Note (Signed)
No further hypotension since hospitalization Asymptomatic Well controlled today Continue home antiHTN

## 2022-11-03 ENCOUNTER — Ambulatory Visit: Payer: Self-pay

## 2022-11-03 NOTE — Telephone Encounter (Signed)
Message from Erick Blinks sent at 11/03/2022  3:15 PM EST  Summary: Hemorrhoid, seeking advice   Pt called reporting that he has a hemorrhoid and wants OTC advice, he is using preparation H and wants to know if he should be using something else.         Chief Complaint: tender hemorrhoid x 1  Symptoms: tenderness Frequency: yesterday Pertinent Negatives: Patient denies abd. Pain, fever, rectal bleeding, vomiting Disposition: '[]'$ ED /'[]'$ Urgent Care (no appt availability in office) / '[]'$ Appointment(In office/virtual)/ '[]'$  [facility]/ '[x]'$ Home Care/ '[]'$ Refused Recommended Disposition /'[]'$ [facility] [facility]/ '[]'$  Follow-up with PCP Additional Notes: Pt requesting prescription for hemorrhoid call to [facility]- please advise pt if called in   Reason for Disposition  Mild rectal pain  Answer Assessment - Initial Assessment Questions 1. SYMPTOM:  "What's the main symptom you're concerned about?" (e.g., pain, itching, swelling, rash)     Hemor- yest 2. ONSET: "When did the *No Answer*  start?"     Yesterday  3. RECTAL PAIN: "Do you have any pain around your rectum?" "How bad is the pain?"  (Scale 0-10; or mild, moderate, severe)   - NONE (0): no pain   - MILD (1-3): doesn't interfere with normal activities    - MODERATE (4-7): interferes with normal activities or awakens from sleep, limping    - SEVERE (8-10): excruciating pain, unable to have a bowel movement      no 4. RECTAL ITCHING: "Do you have any itching in this area?" "How bad is the itching?"  (Scale 0-10; or mild, moderate, severe)   - NONE: no itching   - MILD: doesn't interfere with normal activities    - MODERATE-SEVERE: interferes with normal activities or awakens from sleep     no 5. CONSTIPATION: "Do you have constipation?" If Yes, ask: "How bad is it?"     staringin 6. CAUSE: "What do you think is causing the anus symptoms?"     *No Answer* 7. OTHER SYMPTOMS: "Do you have any other symptoms?"  (e.g.,  abdomen pain, fever, rectal bleeding, vomiting)     no 8. PREGNANCY: "Is there any chance you are pregnant?" "When was your last menstrual period?"     *No Answer*  Protocols used: Rectal Symptoms-A-AH

## 2022-11-04 ENCOUNTER — Ambulatory Visit: Payer: Self-pay

## 2022-11-04 MED ORDER — HYDROCORTISONE ACETATE 25 MG RE SUPP: 25.0000 mg | Freq: Two times a day (BID) | RECTAL | 0 refills | Status: DC

## 2022-11-04 NOTE — Addendum Note (Signed)
Addended by: Virginia Crews on: 11/04/2022 08:00 AM   Modules accepted: Orders

## 2022-11-04 NOTE — Telephone Encounter (Signed)
  Chief Complaint: Wants something external for hemorrhoid relief Symptoms: 1 external  Frequency:  Pertinent Negatives: Patient denies  Disposition: '[]'$ ED /'[]'$ Urgent Care (no appt availability in office) / '[]'$ Appointment(In office/virtual)/ '[]'$  [facility]/ '[]'$ Home Care/ '[]'$ Refused Recommended Disposition /'[]'$ [place] [facility]/ '[]'$  Follow-up with PCP Additional Notes: Pt recently had colon sx. He does not want to place anything inside. He would like an external gel or cream.  Pt would like this this afternoon if possible.     Summary: Hemorrhoid, seeking advice   Pt called back saying the office sent him a suppository and he said he wants cream.  He does not want anything internal.  He ask that he please get this today so he can get some relief  CB#  [phone]  [facility]  ----- Message from Erick Blinks sent at 11/03/2022  3:15 PM EST ----- Pt called reporting that he has a hemorrhoid and wants OTC advice, he is using preparation H and wants to know if he should be using something else.

## 2022-11-04 NOTE — Telephone Encounter (Signed)
Rx for hydrocortisone suppositories sent to pharmacy.

## 2022-11-04 NOTE — Telephone Encounter (Signed)
  Chief Complaint: medication problem Symptoms: hemorrhoids and rectal pain Frequency: 2 days  Pertinent Negatives: NA Disposition: '[]'$ ED /'[]'$ Urgent Care (no appt availability in office) / '[]'$ Appointment(In office/virtual)/ '[]'$  [facility]/ '[x]'$ Home Care/ '[]'$ Refused Recommended Disposition /'[]'$ [place] [facility]/ '[]'$  Follow-up with PCP Additional Notes: pt has been using Prep-H for hemorrhoids and pain but wanting the cream so he doesn't have to place internal. Pt asked if cream could be sent in instead of suppository. Secured chatted Dr. B and said cream was OTC. Advised pt of this and recommended he try Tucks pads to get extra relief. Pt verbalized understanding.   Summary: med for hemorrhoids   Patient called back checking status of med sent for hemorrhoids/ He says he would rather have the cream than the suppository. Please call back with further status.     Reason for Disposition  Caller has medicine question only, adult not sick, AND triager answers question  Answer Assessment - Initial Assessment Questions 1. NAME of MEDICINE: "What medicine(s) are you calling about?"     Hydrocortisone suppository 2. QUESTION: "What is your question?" (e.g., double dose of medicine, side effect)     Wanting the cream not the suppository since just had colon sx 8 weeks ago  3. PRESCRIBER: "Who prescribed the medicine?" Reason: if prescribed by specialist, call should be referred to that group.     Dr. Jacinto Reap 4. SYMPTOMS: "Do you have any symptoms?" If Yes, ask: "What symptoms are you having?"  "How bad are the symptoms (e.g., mild, moderate, severe)     Hemorrhoids and rectal pain  Protocols used: Medication Question Call-A-AH

## 2022-11-07 MED ORDER — HYDROCORTISONE ACETATE 1 % EX OINT: TOPICAL_OINTMENT | CUTANEOUS | 1 refills | Status: AC

## 2022-11-07 NOTE — Addendum Note (Signed)
Addended by: Virginia Crews on: 11/07/2022 08:39 AM   Modules accepted: Orders

## 2022-11-14 DIAGNOSIS — K6289 Other specified diseases of anus and rectum: Secondary | ICD-10-CM | POA: Diagnosis not present

## 2022-11-14 DIAGNOSIS — K56699 Other intestinal obstruction unspecified as to partial versus complete obstruction: Secondary | ICD-10-CM | POA: Diagnosis not present

## 2022-11-14 DIAGNOSIS — K5730 Diverticulosis of large intestine without perforation or abscess without bleeding: Secondary | ICD-10-CM | POA: Diagnosis not present

## 2022-11-14 DIAGNOSIS — K5900 Constipation, unspecified: Secondary | ICD-10-CM | POA: Diagnosis not present

## 2022-11-17 DIAGNOSIS — N200 Calculus of kidney: Secondary | ICD-10-CM | POA: Diagnosis not present

## 2022-11-17 DIAGNOSIS — K913 Postprocedural intestinal obstruction, unspecified as to partial versus complete: Secondary | ICD-10-CM | POA: Diagnosis not present

## 2022-11-17 DIAGNOSIS — K8020 Calculus of gallbladder without cholecystitis without obstruction: Secondary | ICD-10-CM | POA: Diagnosis not present

## 2022-11-17 DIAGNOSIS — K9189 Other postprocedural complications and disorders of digestive system: Secondary | ICD-10-CM | POA: Diagnosis not present

## 2022-11-17 DIAGNOSIS — D735 Infarction of spleen: Secondary | ICD-10-CM | POA: Diagnosis not present

## 2022-12-07 DIAGNOSIS — I4891 Unspecified atrial fibrillation: Secondary | ICD-10-CM | POA: Diagnosis not present

## 2022-12-07 DIAGNOSIS — E039 Hypothyroidism, unspecified: Secondary | ICD-10-CM | POA: Diagnosis not present

## 2022-12-07 DIAGNOSIS — Z79890 Hormone replacement therapy (postmenopausal): Secondary | ICD-10-CM | POA: Diagnosis not present

## 2022-12-07 DIAGNOSIS — I10 Essential (primary) hypertension: Secondary | ICD-10-CM | POA: Diagnosis not present

## 2022-12-07 DIAGNOSIS — Z980 Intestinal bypass and anastomosis status: Secondary | ICD-10-CM | POA: Diagnosis not present

## 2022-12-07 DIAGNOSIS — K9189 Other postprocedural complications and disorders of digestive system: Secondary | ICD-10-CM | POA: Diagnosis not present

## 2022-12-07 DIAGNOSIS — R933 Abnormal findings on diagnostic imaging of other parts of digestive tract: Secondary | ICD-10-CM | POA: Diagnosis not present

## 2022-12-07 DIAGNOSIS — R194 Change in bowel habit: Secondary | ICD-10-CM | POA: Diagnosis not present

## 2022-12-07 DIAGNOSIS — Z79899 Other long term (current) drug therapy: Secondary | ICD-10-CM | POA: Diagnosis not present

## 2022-12-16 DIAGNOSIS — K56699 Other intestinal obstruction unspecified as to partial versus complete obstruction: Secondary | ICD-10-CM | POA: Diagnosis not present

## 2022-12-16 DIAGNOSIS — K56609 Unspecified intestinal obstruction, unspecified as to partial versus complete obstruction: Secondary | ICD-10-CM | POA: Diagnosis not present

## 2022-12-16 DIAGNOSIS — K9189 Other postprocedural complications and disorders of digestive system: Secondary | ICD-10-CM | POA: Diagnosis not present

## 2022-12-16 DIAGNOSIS — I10 Essential (primary) hypertension: Secondary | ICD-10-CM | POA: Diagnosis not present

## 2022-12-16 DIAGNOSIS — D638 Anemia in other chronic diseases classified elsewhere: Secondary | ICD-10-CM | POA: Diagnosis not present

## 2022-12-16 DIAGNOSIS — I4891 Unspecified atrial fibrillation: Secondary | ICD-10-CM | POA: Diagnosis not present

## 2022-12-16 DIAGNOSIS — K660 Peritoneal adhesions (postprocedural) (postinfection): Secondary | ICD-10-CM | POA: Diagnosis not present

## 2022-12-16 DIAGNOSIS — E785 Hyperlipidemia, unspecified: Secondary | ICD-10-CM | POA: Diagnosis not present

## 2022-12-16 DIAGNOSIS — E039 Hypothyroidism, unspecified: Secondary | ICD-10-CM | POA: Diagnosis not present

## 2022-12-20 LAB — BASIC METABOLIC PANEL
BUN: 14 (ref 4–21)
CO2: 26 — AB (ref 13–22)
Chloride: 106 (ref 99–108)
Creatinine: 1.1 (ref 0.6–1.3)
Glucose: 98
Potassium: 4 mEq/L (ref 3.5–5.1)
Sodium: 137 (ref 137–147)

## 2022-12-20 LAB — COMPREHENSIVE METABOLIC PANEL
Calcium: 9.5 (ref 8.7–10.7)
eGFR: 67

## 2022-12-20 LAB — CBC AND DIFFERENTIAL
HCT: 40 — AB (ref 41–53)
Hemoglobin: 13.3 — AB (ref 13.5–17.5)
Platelets: 157 10*3/uL (ref 150–400)
WBC: 8.7

## 2022-12-20 LAB — CBC: RBC: 4.86 (ref 3.87–5.11)

## 2022-12-22 ENCOUNTER — Telehealth: Payer: Self-pay

## 2022-12-22 NOTE — Transitions of Care (Post Inpatient/ED Visit) (Signed)
   12/22/2022  Name: Gregory Mckay MRN: [id number] DOB: October 08, 1945  Today's TOC FU Call Status: Today's TOC FU Call Status:: Successful TOC FU Call Competed TOC FU Call Complete Date: 12/22/22  Transition Care Management Follow-up Telephone Call Date of Discharge: 12/21/22 Discharge Facility: Other ([facility]) Name of Other (Non[facility]) Discharge Facility: [facility] Type of Discharge: Inpatient Admission Primary Inpatient Discharge Diagnosis:: Ileal Stricture How have you been since you were released from the hospital?: Better Any questions or concerns?: No  Items Reviewed: Did you receive and understand the discharge instructions provided?: Yes Medications obtained and verified?: Yes (Medications Reviewed) Any new allergies since your discharge?: No Dietary orders reviewed?: Yes Type of Diet Ordered:: Regular Do you have support at home?: Yes People in Home: spouse Name of Support/Comfort Primary Source: [place] and Equipment/Supplies: [place] Ordered?: No Any new equipment or medical supplies ordered?: No  Functional Questionnaire: Do you need assistance with bathing/showering or dressing?: No Do you need assistance with meal preparation?: No Do you need assistance with eating?: No Do you have difficulty maintaining continence: No Do you need assistance with getting out of bed/getting out of a chair/moving?: No Do you have difficulty managing or taking your medications?: No  Follow up appointments reviewed: PCP Follow-up appointment confirmed?: NA (Patient had surgery, needs f/u w/surgeon) [facility] Follow-up appointment confirmed?: Yes Date of Specialist follow-up appointment?: 01/12/23 Follow-Up Specialty Provider:: Dr. Milus Banister Do you need transportation to your follow-up appointment?: No Do you understand care options if your condition(s) worsen?: Yes-patient verbalized understanding  SDOH Interventions Today    Flowsheet Row  Most Recent Value  SDOH Interventions   Food Insecurity Interventions Intervention Not Indicated  Housing Interventions Intervention Not Indicated      Gregory Killian, RN, BSN, CCM Care Management Coordinator [facility] Phone: [phone]: [phone]

## 2023-01-20 ENCOUNTER — Other Ambulatory Visit: Payer: Self-pay | Admitting: Family Medicine

## 2023-01-24 ENCOUNTER — Ambulatory Visit (INDEPENDENT_AMBULATORY_CARE_PROVIDER_SITE_OTHER): Payer: Medicare Other | Admitting: Family Medicine

## 2023-01-24 ENCOUNTER — Encounter: Payer: Self-pay | Admitting: Family Medicine

## 2023-01-24 VITALS — BP 130/77 | HR 61 | Temp 97.7°F | Resp 16 | Ht 71.0 in | Wt 172.0 lb

## 2023-01-24 DIAGNOSIS — R972 Elevated prostate specific antigen [PSA]: Secondary | ICD-10-CM | POA: Diagnosis not present

## 2023-01-24 DIAGNOSIS — R7303 Prediabetes: Secondary | ICD-10-CM | POA: Diagnosis not present

## 2023-01-24 DIAGNOSIS — E785 Hyperlipidemia, unspecified: Secondary | ICD-10-CM

## 2023-01-24 DIAGNOSIS — Z0000 Encounter for general adult medical examination without abnormal findings: Secondary | ICD-10-CM | POA: Diagnosis not present

## 2023-01-24 DIAGNOSIS — R7989 Other specified abnormal findings of blood chemistry: Secondary | ICD-10-CM

## 2023-01-24 DIAGNOSIS — N1831 Chronic kidney disease, stage 3a: Secondary | ICD-10-CM

## 2023-01-24 DIAGNOSIS — E039 Hypothyroidism, unspecified: Secondary | ICD-10-CM

## 2023-01-24 DIAGNOSIS — I10 Essential (primary) hypertension: Secondary | ICD-10-CM

## 2023-01-24 NOTE — Progress Notes (Signed)
I,Sulibeya S Dimas,acting as a Neurosurgeon for Shirlee Latch, MD.,have documented all relevant documentation on the behalf of Shirlee Latch, MD,as directed by  Shirlee Latch, MD while in the presence of Shirlee Latch, MD.    Annual Wellness Visit     Patient: Gregory Mckay, Male    DOB: [birth year], 77 y.o.   MRN: [id number] Visit Date: 01/24/2023  Today's Provider: Shirlee Latch, MD   Chief Complaint  Patient presents with   Medicare Wellness   Subjective    Gregory Mckay is a 77 y.o. male who presents today for his Annual Wellness Visit. He reports consuming a general diet. The patient does not participate in regular exercise at present. He generally feels well. He reports sleeping fairly well. He does have additional problems to discuss today.   HPI Patient C/O elevated BP readings in the evenings, last night it was 142/90. Usually lowers when taking cialis and going to resume that later this week  Gets hot flashes in the evening. Short lived and cools right back down.  Has not been taking thyroid meds since surgery.  Medications: Outpatient Medications Prior to Visit  Medication Sig   amLODipine-benazepril (LOTREL) 5-10 MG capsule TAKE 1 CAPSULE BY MOUTH ONCE DAILY   Cholecalciferol (VITAMIN D3) 10000 units TABS Take 1 tablet by mouth daily. Taking at 1:00 am   Coenzyme Q10 (COQ-10) 50 MG CAPS Take 50 mg by mouth daily. Taking at 1:00 am.   Gymnema Sylvestris Leaf POWD Take 400 mg by mouth 2 (two) times daily. Taking at 1:00 am and 1:00 pm   Hydrocortisone Acetate 1 % OINT Apply externally to hemorrhoids BID prn   Misc Natural Products (BETA-SITOSTEROL PLANT STEROLS) CAPS Take 60 mg by mouth 2 (two) times daily. Taking at 3:00 and 3:00 pm   RESVERATROL PO Take 500 mg by mouth daily. Taking at 1:00 am.   rosuvastatin (CRESTOR) 5 MG tablet TAKE ONE TABLET BY MOUTH EVERY DAY   tadalafil (CIALIS) 5 MG tablet TAKE 1 TABLET BY MOUTH ONCE DAILY   Testosterone  (ANDROGEL) 20.25 MG/1.25GM (1.62%) GEL Place 2 Pump onto the skin in the morning.   tretinoin (RETIN-A) 0.1 % cream APPLY TO FACE AT BEDTIME AND WASH OFF IN THE MORNING   triamcinolone ointment (KENALOG) 0.5 % Apply 1 application topically 2 (two) times daily.   vitamin E 400 UNIT capsule Take 400 Units by mouth daily. Taking at 1:00 am   Vitamin Mixture (ESTER-C PO) Take 1,000 mg by mouth daily. Taking at 1:00 am   [DISCONTINUED] testosterone (ANDROGEL) 50 MG/5GM (1%) GEL Place 5 g onto the skin daily.   ARMOUR THYROID 60 MG tablet TAKE ONE TABLET EVERY DAY BEFORE BREAKFAST   OVER THE COUNTER MEDICATION Take 750 mg by mouth 2 (two) times daily. Curamed. Taking 1 tablet at 1:00 am, and 1 tablet at 1:00 pm.   [DISCONTINUED] Biotin 5000 MCG CAPS Take 1 capsule by mouth daily. Taking at 1:00 am (Patient not taking: Reported on 01/24/2023)   [DISCONTINUED] GARLIC PO Take 132 mg by mouth 2 (two) times daily. Aged Garlic Extract. Taking at 2:00 am and 2:00 pm (Patient not taking: Reported on 01/24/2023)   [DISCONTINUED] Levomefolate Glucosamine (METHYLFOLATE PO) Take 1,000 mcg by mouth daily. Taking at 1:00 am. (Patient not taking: Reported on 01/24/2023)   [DISCONTINUED] milk thistle 175 MG tablet Take 175 mg by mouth 2 (two) times daily. Taking at 1:00 am and 1:00 pm (Patient not taking: Reported on 01/24/2023)   [  DISCONTINUED] PHOSPHATIDYLSERINE PO Take 300 mg by mouth daily. Taking at 1:00 am (Patient not taking: Reported on 01/24/2023)   No facility-administered medications prior to visit.    No Known Allergies  Patient Care Team: Erasmo Downer, MD as PCP - General (Family Medicine) Lamar Blinks, MD as Consulting Physician (Cardiology) Orson Ape, MD as Consulting Physician (Urology)  Review of Systems  All other systems reviewed and are negative.   Last CBC Lab Results  Component Value Date   WBC 8.2 12/29/2021   HGB 16.9 12/29/2021   HCT 50 12/29/2021   MCV 82.0  08/01/2018   MCH 25.1 (L) 08/01/2018   RDW 14.5 08/01/2018   PLT 165 12/29/2021   Last metabolic panel Lab Results  Component Value Date   GLUCOSE 97 07/15/2022   NA 140 07/15/2022   K 4.4 07/15/2022   CL 105 07/15/2022   CO2 24 07/15/2022   BUN 19 07/15/2022   CREATININE 1.33 (H) 07/15/2022   EGFR 56 (L) 07/15/2022   CALCIUM 9.6 07/15/2022   PROT 6.9 09/10/2020   ALBUMIN 4.6 12/29/2021   LABGLOB 2.6 09/10/2020   AGRATIO 1.7 09/10/2020   BILITOT 0.4 09/10/2020   ALKPHOS 58 12/29/2021   AST 13 (A) 12/29/2021   ALT 18 12/29/2021   ANIONGAP 8 08/01/2018   Last lipids Lab Results  Component Value Date   CHOL 150 12/29/2021   HDL 20 (A) 12/29/2021   LDLCALC 20 12/29/2021   TRIG 295 (A) 12/29/2021   CHOLHDL 5.3 (H) 09/10/2020   Last hemoglobin A1c Lab Results  Component Value Date   HGBA1C 5.2 07/15/2022   Last thyroid functions Lab Results  Component Value Date   TSH 2.530 07/15/2022   Last vitamin D Lab Results  Component Value Date   VD25OH 81.9 12/29/2021   Last vitamin B12 and Folate Lab Results  Component Value Date   VITAMINB12 734 12/29/2021        Objective    Vitals: BP 130/77 (BP Location: Left Arm, Patient Position: Sitting, Cuff Size: Large)   Pulse 61   Temp 97.7 F (36.5 C) (Temporal)   Resp 16   Ht 5\' 11"  (1.803 m)   Wt 172 lb (78 kg)   SpO2 99%   BMI 23.99 kg/m  BP Readings from Last 3 Encounters:  01/24/23 130/77  11/01/22 138/84  07/15/22 134/80   Wt Readings from Last 3 Encounters:  01/24/23 172 lb (78 kg)  11/01/22 172 lb (78 kg)  07/15/22 189 lb (85.7 kg)      Physical Exam Vitals reviewed.  Constitutional:      General: He is not in acute distress.    Appearance: Normal appearance. He is well-developed. He is not diaphoretic.  HENT:     Head: Normocephalic and atraumatic.     Right Ear: Tympanic membrane, ear canal and external ear normal.     Left Ear: Tympanic membrane, ear canal and external ear normal.      Nose: Nose normal.     Mouth/Throat:     Mouth: Mucous membranes are moist.     Pharynx: Oropharynx is clear. No oropharyngeal exudate.  Eyes:     General: No scleral icterus.    Conjunctiva/sclera: Conjunctivae normal.     Pupils: Pupils are equal, round, and reactive to light.  Neck:     Thyroid: No thyromegaly.  Cardiovascular:     Rate and Rhythm: Normal rate and regular rhythm.     Heart sounds:  Normal heart sounds. No murmur heard. Pulmonary:     Effort: Pulmonary effort is normal. No respiratory distress.     Breath sounds: Normal breath sounds. No wheezing or rales.  Abdominal:     General: There is no distension.     Palpations: Abdomen is soft.     Tenderness: There is no abdominal tenderness.  Musculoskeletal:        General: No deformity.     Cervical back: Neck supple.     Right lower leg: No edema.     Left lower leg: No edema.  Lymphadenopathy:     Cervical: No cervical adenopathy.  Skin:    General: Skin is warm and dry.     Findings: No rash.  Neurological:     Mental Status: He is alert and oriented to person, place, and time. Mental status is at baseline.     Gait: Gait normal.  Psychiatric:        Mood and Affect: Mood normal.        Behavior: Behavior normal.        Thought Content: Thought content normal.      Most recent functional status assessment:    01/24/2023    8:51 AM  In your present state of health, do you have any difficulty performing the following activities:  Hearing? 1  Vision? 0  Difficulty concentrating or making decisions? 0  Walking or climbing stairs? 0  Dressing or bathing? 0  Doing errands, shopping? 0   Most recent fall risk assessment:    01/24/2023    8:51 AM  Fall Risk   Falls in the past year? 0  Number falls in past yr: 0  Injury with Fall? 0  Risk for fall due to : No Fall Risks  Follow up Falls evaluation completed    Most recent depression screenings:    01/24/2023    8:51 AM 07/15/2022    8:50 AM   PHQ 2/9 Scores  PHQ - 2 Score 0 0  PHQ- 9 Score 0 0   Most recent cognitive screening:     No data to display         Most recent Audit-C alcohol use screening    01/24/2023    8:51 AM  Alcohol Use Disorder Test (AUDIT)  1. How often do you have a drink containing alcohol? 0  2. How many drinks containing alcohol do you have on a typical day when you are drinking? 0  3. How often do you have six or more drinks on one occasion? 0  AUDIT-C Score 0   A score of 3 or more in women, and 4 or more in men indicates increased risk for alcohol abuse, EXCEPT if all of the points are from question 1   No results found for any visits on 01/24/23.  Assessment & Plan     Annual wellness visit done today including the all of the following: Reviewed patient's Family Medical History Reviewed and updated list of patient's medical providers Assessment of cognitive impairment was done Assessed patient's functional ability Established a written schedule for health screening services Health Risk Assessent Completed and Reviewed  Exercise Activities and Dietary recommendations  Goals      DIET - INCREASE WATER INTAKE     Recommend to drink at least 6-8 8oz glasses of water per day.         Immunization History  Administered Date(s) Administered   Fluad Quad(high Dose 65+) 06/04/2019, 06/16/2021, 06/29/2022  Influenza-Unspecified 06/09/2020   PFIZER(Purple Top)SARS-COV-2 Vaccination 10/17/2019, 11/04/2019   Pneumococcal Conjugate-13 12/20/2016   Pneumococcal Polysaccharide-23 02/27/2018   Zoster Recombinat (Shingrix) 02/27/2018, 04/30/2018    Health Maintenance  Topic Date Due   DTaP/Tdap/Td (1 - Tdap) Never done   COVID-19 Vaccine (3 - Pfizer risk series) 12/02/2019   COLONOSCOPY (Pts 45-60yrs Insurance coverage will need to be confirmed)  02/02/2023   INFLUENZA VACCINE  04/27/2023   Medicare Annual Wellness (AWV)  01/24/2024   Pneumonia Vaccine 56+ Years old  Completed    Hepatitis C Screening  Completed   Zoster Vaccines- Shingrix  Completed   HPV VACCINES  Aged Out   Fecal DNA (Cologuard)  Discontinued     Discussed health benefits of physical activity, and encouraged him to engage in regular exercise appropriate for his age and condition.    Problem List Items Addressed This Visit       Cardiovascular and Mediastinum   Hypertension    Well controlled Continue current medications Recheck metabolic panel F/u in 6 months       Relevant Orders   Hepatic function panel     Endocrine   Hypothyroidism    Previously well controlled Has not been taking armour thyroid since surgery - will likely need to resume pending lab result Recheck TSH        Relevant Orders   TSH     Genitourinary   Stage 3a chronic kidney disease (HCC)    Chronic and stable Reviewed metabolic panel Avoid nephrotoxic meds         Other   Elevated PSA    Has previously seen urology and had negative MRI and biopsy PSA remains elevated We will continue to trend and refer back to urology if it increases significantly      Relevant Orders   PSA Total (Reflex To Free)   Hyperlipidemia    Previously well controlled Continue statin Repeat FLP and CMP annually      Relevant Orders   Lipid panel   Hepatic function panel   Prediabetes    Recommend low carb diet A1c normalized - recheck today      Relevant Orders   Hemoglobin A1c   Other Visit Diagnoses     Encounter for annual wellness visit (AWV) in Medicare patient    -  Primary   Encounter for annual physical exam       Relevant Orders   Lipid panel   Hepatic function panel   Hemoglobin A1c   PSA Total (Reflex To Free)   TSH   Low testosterone       Relevant Orders   Testosterone,Free and Total      Agree to refill Androgel as urologist is retiring  Return in about 6 months (around 07/26/2023) for chronic disease f/u.     I, Shirlee Latch, MD, have reviewed all documentation for this  visit. The documentation on 01/24/23 for the exam, diagnosis, procedures, and orders are all accurate and complete.   Anna Livers, Marzella Schlein, MD, MPH [facility]

## 2023-01-24 NOTE — Assessment & Plan Note (Signed)
Has previously seen urology and had negative MRI and biopsy PSA remains elevated We will continue to trend and refer back to urology if it increases significantly

## 2023-01-24 NOTE — Assessment & Plan Note (Signed)
Well controlled Continue current medications Recheck metabolic panel F/u in 6 months  

## 2023-01-24 NOTE — Assessment & Plan Note (Signed)
Recommend low carb diet A1c normalized - recheck today

## 2023-01-24 NOTE — Assessment & Plan Note (Signed)
Chronic and stable Reviewed metabolic panel Avoid nephrotoxic meds  

## 2023-01-24 NOTE — Assessment & Plan Note (Signed)
Previously well controlled Continue statin Repeat FLP and CMP annually 

## 2023-01-24 NOTE — Assessment & Plan Note (Signed)
Previously well controlled Has not been taking armour thyroid since surgery - will likely need to resume pending lab result Recheck TSH

## 2023-01-25 LAB — TESTOSTERONE,FREE AND TOTAL: Testosterone: 20 ng/dL — ABNORMAL LOW (ref 264–916)

## 2023-01-28 LAB — LIPID PANEL
Chol/HDL Ratio: 3.4 ratio (ref 0.0–5.0)
Cholesterol, Total: 143 mg/dL (ref 100–199)
HDL: 42 mg/dL (ref >39)
LDL Chol Calc (NIH): 80 mg/dL (ref 0–99)
Triglycerides: 116 mg/dL (ref 0–149)
VLDL Cholesterol Cal: 21 mg/dL (ref 5–40)

## 2023-01-28 LAB — HEPATIC FUNCTION PANEL
ALT: 63 IU/L — ABNORMAL HIGH (ref 0–44)
AST: 32 IU/L (ref 0–40)
Albumin: 4.2 g/dL (ref 3.8–4.8)
Alkaline Phosphatase: 70 IU/L (ref 44–121)
Bilirubin Total: 0.5 mg/dL (ref 0.0–1.2)
Bilirubin, Direct: 0.14 mg/dL (ref 0.00–0.40)
Total Protein: 6.4 g/dL (ref 6.0–8.5)

## 2023-01-28 LAB — PSA TOTAL (REFLEX TO FREE): Prostate Specific Ag, Serum: 3.8 ng/mL (ref 0.0–4.0)

## 2023-01-28 LAB — HEMOGLOBIN A1C
Est. average glucose Bld gHb Est-mCnc: 117 mg/dL
Hgb A1c MFr Bld: 5.7 % — ABNORMAL HIGH (ref 4.8–5.6)

## 2023-01-28 LAB — TSH: TSH: 2.76 u[IU]/mL (ref 0.450–4.500)

## 2023-01-30 ENCOUNTER — Telehealth: Payer: Self-pay

## 2023-01-30 DIAGNOSIS — E039 Hypothyroidism, unspecified: Secondary | ICD-10-CM

## 2023-01-30 NOTE — Telephone Encounter (Signed)
-----   Message from Erasmo Downer, MD sent at 01/27/2023 10:36 AM EDT ----- Normal/stable labs.  Testosterone level is low as expected from not currently being on AndroGel.  Resume at previous dose.  Thyroid level is normal, but okay to resume previous thyroid supplement dose.  Recheck TSH in 2 months after resuming dose.

## 2023-02-14 ENCOUNTER — Other Ambulatory Visit: Payer: Self-pay | Admitting: Family Medicine

## 2023-02-14 NOTE — Telephone Encounter (Signed)
Requested medication (s) are due for refill today - yes  Requested medication (s) are on the active medication list -yes  Future visit scheduled -yes  Last refill: 11/15/22  Notes to clinic: listed as historical, off protocol- provider review   Requested Prescriptions  Pending Prescriptions Disp Refills   Testosterone 1.62 % GEL [Pharmacy Med Name: TESTOSTERONE 1.62% GEL GM] 75 g     Sig: APPLY 2 PUMPS TO INNER BICEP OR CHEST EVERY MORNING AS DIRECTED.     Off-Protocol Failed - 02/14/2023 10:55 AM      Failed - Medication not assigned to a protocol, review manually.      Passed - Valid encounter within last 12 months    Recent Outpatient Visits           3 weeks ago Encounter for annual wellness visit (AWV) in Medicare patient   [place] [facility] [place], Marzella Schlein, MD   3 months ago Malignant neoplasm of ascending colon [facility])   [place] [facility] [place] Fl[place], Marzella Schlein, MD   7 months ago Primary hypertension   [place] [facility] [place], Marzella Schlein, MD   1 year ago Encounter for annual wellness visit (AWV) in Medicare patient   [place] [facility] [place], Marzella Schlein, MD   1 year ago Hypothyroidism, unspecified type   [place] [facility] [place], Marzella Schlein, MD       Future Appointments             In 5 months Bacigalupo, Marzella Schlein, MD [facility]               Requested Prescriptions  Pending Prescriptions Disp Refills   Testosterone 1.62 % GEL [Pharmacy Med Name: TESTOSTERONE 1.62% GEL GM] 75 g     Sig: APPLY 2 PUMPS TO INNER BICEP OR CHEST EVERY MORNING AS DIRECTED.     Off-Protocol Failed - 02/14/2023 10:55 AM      Failed - Medication not assigned to a protocol, review manually.      Passed - Valid encounter within last 12 months    Recent Outpatient Visits           3 weeks ago Encounter for annual wellness visit (AWV) in  Medicare patient   [place] [facility] [place], Marzella Schlein, MD   3 months ago Malignant neoplasm of ascending colon [facility])   [place] [facility] [place], Marzella Schlein, MD   7 months ago Primary hypertension    [facility] [place], Marzella Schlein, MD   1 year ago Encounter for annual wellness visit (AWV) in Medicare patient   [facility] [place], Marzella Schlein, MD   1 year ago Hypothyroidism, unspecified type   [facility] [place], Marzella Schlein, MD       Future Appointments             In 5 months Bacigalupo, Marzella Schlein, MD [facility], [facility]

## 2023-02-15 DIAGNOSIS — I4510 Unspecified right bundle-branch block: Secondary | ICD-10-CM | POA: Diagnosis not present

## 2023-02-15 DIAGNOSIS — I10 Essential (primary) hypertension: Secondary | ICD-10-CM | POA: Diagnosis not present

## 2023-02-15 DIAGNOSIS — E785 Hyperlipidemia, unspecified: Secondary | ICD-10-CM | POA: Diagnosis not present

## 2023-02-15 DIAGNOSIS — I480 Paroxysmal atrial fibrillation: Secondary | ICD-10-CM | POA: Diagnosis not present

## 2023-02-27 ENCOUNTER — Other Ambulatory Visit: Payer: Self-pay | Admitting: Family Medicine

## 2023-02-27 DIAGNOSIS — E039 Hypothyroidism, unspecified: Secondary | ICD-10-CM

## 2023-02-28 NOTE — Telephone Encounter (Signed)
Requested Prescriptions  Pending Prescriptions Disp Refills   ARMOUR THYROID 60 MG tablet [Pharmacy Med Name: ARMOUR THYROID 60 MG TAB] 90 tablet 2    Sig: TAKE ONE TABLET EVERY DAY BEFORE BREAKFAST     Endocrinology:  Hypothyroid Agents Passed - 02/27/2023 10:30 AM      Passed - TSH in normal range and within 360 days    TSH  Date Value Ref Range Status  01/24/2023 2.760 0.450 - 4.500 uIU/mL Final         Passed - Valid encounter within last 12 months    Recent Outpatient Visits           1 month ago Encounter for annual wellness visit (AWV) in Medicare patient   [place] [facility] [place], Marzella Schlein, MD   3 months ago Malignant neoplasm of ascending colon [facility])   [place] [facility] [place], Marzella Schlein, MD   7 months ago Primary hypertension   [place] [facility] [place], Marzella Schlein, MD   1 year ago Encounter for annual wellness visit (AWV) in Medicare patient   [place] [facility] [place], Marzella Schlein, MD   1 year ago Hypothyroidism, unspecified type   [facility] [place], Marzella Schlein, MD       Future Appointments             In 5 months Bacigalupo, Marzella Schlein, MD [facility], [facility]

## 2023-03-02 ENCOUNTER — Ambulatory Visit: Payer: Medicare Other | Admitting: Dermatology

## 2023-03-02 VITALS — BP 123/63 | HR 82

## 2023-03-02 DIAGNOSIS — C44519 Basal cell carcinoma of skin of other part of trunk: Secondary | ICD-10-CM

## 2023-03-02 DIAGNOSIS — D485 Neoplasm of uncertain behavior of skin: Secondary | ICD-10-CM

## 2023-03-02 DIAGNOSIS — W908XXA Exposure to other nonionizing radiation, initial encounter: Secondary | ICD-10-CM

## 2023-03-02 DIAGNOSIS — X32XXXA Exposure to sunlight, initial encounter: Secondary | ICD-10-CM

## 2023-03-02 DIAGNOSIS — L578 Other skin changes due to chronic exposure to nonionizing radiation: Secondary | ICD-10-CM

## 2023-03-02 NOTE — Progress Notes (Signed)
   Follow-Up Visit   Subjective  Gregory Mckay is a 77 y.o. male who presents for the following: Irregular skin lesion on the back has been there for months, patient thinks it may be a cyst.    The following portions of the chart were reviewed this encounter and updated as appropriate: medications, allergies, medical history  Review of Systems:  No other skin or systemic complaints except as noted in HPI or Assessment and Plan.  Objective  Well appearing patient in no apparent distress; mood and affect are within normal limits.    A focused examination was performed of the following areas:   Relevant exam findings are noted in the Assessment and Plan.  Right Lower Back 1.1 x 0.6 cm ulcerated papule.       Assessment & Plan   ACTINIC DAMAGE - chronic, secondary to cumulative UV radiation exposure/sun exposure over time - diffuse scaly erythematous macules with underlying dyspigmentation - Recommend daily broad spectrum sunscreen SPF 30+ to sun-exposed areas, reapply every 2 hours as needed.  - Recommend staying in the shade or wearing long sleeves, sun glasses (UVA+UVB protection) and wide brim hats (4-inch brim around the entire circumference of the hat). - Call for new or changing lesions.   Neoplasm of uncertain behavior of skin Right Lower Back  Epidermal / dermal shaving  Lesion diameter (cm):  1.1 Informed consent: discussed and consent obtained   Timeout: patient name, date of birth, surgical site, and procedure verified   Anesthesia: the lesion was anesthetized in a standard fashion   Anesthetic:  1% lidocaine w/ epinephrine 1-100,000 buffered w/ 8.4% NaHCO3 Instrument used: flexible razor blade   Hemostasis achieved with: pressure, aluminum chloride and electrodesiccation   Outcome: patient tolerated procedure well    Destruction of lesion  Destruction method: electrodesiccation and curettage   Informed consent: discussed and consent obtained    Curettage performed in three different directions: Yes   Electrodesiccation performed over the curetted area: Yes   Final wound size (cm):  1.3 Hemostasis achieved with:  pressure, aluminum chloride and electrodesiccation Outcome: patient tolerated procedure well with no complications   Post-procedure details: sterile dressing applied and wound care instructions given   Dressing type: bandage   Additional details:  Mupirocin ointment and Bandaid applied   Specimen 1 - Surgical pathology Differential Diagnosis: D48.5 r/o BCC ED&C today  Check Margins: No    Return for follow up pending biopsy results.  Maylene Roes, CMA, am acting as scribe for Willeen Niece, MD .  Documentation: I have reviewed the above documentation for accuracy and completeness, and I agree with the above.  Willeen Niece, MD

## 2023-03-02 NOTE — Patient Instructions (Addendum)
Electrodesiccation and Curettage ("Scrape and Burn") Wound Care Instructions  Leave the original bandage on for 24 hours if possible.  If the bandage becomes soaked or soiled before that time, it is OK to remove it and examine the wound.  A small amount of post-operative bleeding is normal.  If excessive bleeding occurs, remove the bandage, place gauze over the site and apply continuous pressure (no peeking) over the area for 30 minutes. If this does not work, please call our clinic as soon as possible or page your doctor if it is after hours.   Once a day, cleanse the wound with soap and water. It is fine to shower. If a thick crust develops you may use a Q-tip dipped into dilute hydrogen peroxide (mix 1:1 with water) to dissolve it.  Hydrogen peroxide can slow the healing process, so use it only as needed.    After washing, apply petroleum jelly (Vaseline) or an antibiotic ointment if your doctor prescribed one for you, followed by a bandage.    For best healing, the wound should be covered with a layer of ointment at all times. If you are not able to keep the area covered with a bandage to hold the ointment in place, this may mean re-applying the ointment several times a day.  Continue this wound care until the wound has healed and is no longer open. It may take several weeks for the wound to heal and close.  Itching and mild discomfort is normal during the healing process.  If you have any discomfort, you can take Tylenol (acetaminophen) or ibuprofen as directed on the bottle. (Please do not take these if you have an allergy to them or cannot take them for another reason).  Some redness, tenderness and white or yellow material in the wound is normal healing.  If the area becomes very sore and red, or develops a thick yellow-green material (pus), it may be infected; please notify us.    Wound healing continues for up to one year following surgery. It is not unusual to experience pain in the scar  from time to time during the interval.  If the pain becomes severe or the scar thickens, you should notify the office.    A slight amount of redness in a scar is expected for the first six months.  After six months, the redness will fade and the scar will soften and fade.  The color difference becomes less noticeable with time.  If there are any problems, return for a post-op surgery check at your earliest convenience.  To improve the appearance of the scar, you can use silicone scar gel, cream, or sheets (such as Mederma or Serica) every night for up to one year. These are available over the counter (without a prescription).  Please call our office at [phone] for any questions or concerns.     Due to recent changes in healthcare laws, you may see results of your pathology and/or laboratory studies on MyChart before the doctors have had a chance to review them. We understand that in some cases there may be results that are confusing or concerning to you. Please understand that not all results are received at the same time and often the doctors may need to interpret multiple results in order to provide you with the best plan of care or course of treatment. Therefore, we ask that you please give us 2 business days to thoroughly review all your results before contacting the office for clarification. Should   we see a critical lab result, you will be contacted sooner.   If You Need Anything After Your Visit  If you have any questions or concerns for your doctor, please call our main line at [phone] and press option 4 to reach your doctor's medical assistant. If no one answers, please leave a voicemail as directed and we will return your call as soon as possible. Messages left after 4 pm will be answered the following business day.   You may also send us a message via MyChart. We typically respond to MyChart messages within 1-2 business days.  For prescription refills, please ask your  pharmacy to contact our office. Our fax number is [phone].  If you have an urgent issue when the clinic is closed that cannot wait until the next business day, you can page your doctor at the number below.    Please note that while we do our best to be available for urgent issues outside of office hours, we are not available 24/7.   If you have an urgent issue and are unable to reach us, you may choose to seek medical care at your doctor's office, retail clinic, urgent care center, or emergency room.  If you have a medical emergency, please immediately call 911 or go to the emergency department.  Pager Numbers  - Dr. Kowalski: [phone]  - Dr. Moye: [phone]  - Dr. Stewart: [phone]  In the event of inclement weather, please call our main line at [phone] for an update on the status of any delays or closures.  Dermatology Medication Tips: Please keep the boxes that topical medications come in in order to help keep track of the instructions about where and how to use these. Pharmacies typically print the medication instructions only on the boxes and not directly on the medication tubes.   If your medication is too expensive, please contact our office at [phone] option 4 or send us a message through MyChart.   We are unable to tell what your co-pay for medications will be in advance as this is different depending on your insurance coverage. However, we may be able to find a substitute medication at lower cost or fill out paperwork to get insurance to cover a needed medication.   If a prior authorization is required to get your medication covered by your insurance company, please allow us 1-2 business days to complete this process.  Drug prices often vary depending on where the prescription is filled and some pharmacies may offer cheaper prices.  The website www.goodrx.com contains coupons for medications through different pharmacies. The prices here do not  account for what the cost may be with help from insurance (it may be cheaper with your insurance), but the website can give you the price if you did not use any insurance.  - You can print the associated coupon and take it with your prescription to the pharmacy.  - You may also stop by our office during regular business hours and pick up a GoodRx coupon card.  - If you need your prescription sent electronically to a different pharmacy, notify our office through [facility] MyChart or by phone at [phone] option 4.     Si Usted Necesita Algo Despus de Su Visita  Tambin puede enviarnos un mensaje a travs de MyChart. Por lo general respondemos a los mensajes de MyChart en el transcurso de 1 a 2 das hbiles.  Para renovar recetas, por favor pida a su farmacia que se ponga en   contacto con nuestra oficina. Nuestro nmero de fax es el [phone].  Si tiene un asunto urgente cuando la clnica est cerrada y que no puede esperar hasta el siguiente da hbil, puede llamar/localizar a su doctor(a) al nmero que aparece a continuacin.   Por favor, tenga en cuenta que aunque hacemos todo lo posible para estar disponibles para asuntos urgentes fuera del horario de oficina, no estamos disponibles las 24 horas del da, los 7 das de la semana.   Si tiene un problema urgente y no puede comunicarse con nosotros, puede optar por buscar atencin mdica  en el consultorio de su doctor(a), en una clnica privada, en un centro de atencin urgente o en una sala de emergencias.  Si tiene una emergencia mdica, por favor llame inmediatamente al 911 o vaya a la sala de emergencias.  Nmeros de bper  - Dr. Kowalski: [phone]  - Dra. Moye: [phone]  - Dra. Stewart: [phone]  En caso de inclemencias del tiempo, por favor llame a nuestra lnea principal al [phone] para una actualizacin sobre el estado de cualquier retraso o cierre.  Consejos para la medicacin en dermatologa: Por  favor, guarde las cajas en las que vienen los medicamentos de uso tpico para ayudarle a seguir las instrucciones sobre dnde y cmo usarlos. Las farmacias generalmente imprimen las instrucciones del medicamento slo en las cajas y no directamente en los tubos del medicamento.   Si su medicamento es muy caro, por favor, pngase en contacto con nuestra oficina llamando al [phone] y presione la opcin 4 o envenos un mensaje a travs de MyChart.   No podemos decirle cul ser su copago por los medicamentos por adelantado ya que esto es diferente dependiendo de la cobertura de su seguro. Sin embargo, es posible que podamos encontrar un medicamento sustituto a menor costo o llenar un formulario para que el seguro cubra el medicamento que se considera necesario.   Si se requiere una autorizacin previa para que su compaa de seguros cubra su medicamento, por favor permtanos de 1 a 2 das hbiles para completar este proceso.  Los precios de los medicamentos varan con frecuencia dependiendo del lugar de dnde se surte la receta y alguna farmacias pueden ofrecer precios ms baratos.  El sitio web www.goodrx.com tiene cupones para medicamentos de diferentes farmacias. Los precios aqu no tienen en cuenta lo que podra costar con la ayuda del seguro (puede ser ms barato con su seguro), pero el sitio web puede darle el precio si no utiliz ningn seguro.  - Puede imprimir el cupn correspondiente y llevarlo con su receta a la farmacia.  - Tambin puede pasar por nuestra oficina durante el horario de atencin regular y recoger una tarjeta de cupones de GoodRx.  - Si necesita que su receta se enve electrnicamente a una farmacia diferente, informe a nuestra oficina a travs de MyChart de [facility] o por telfono llamando al [phone] y presione la opcin 4.  

## 2023-03-09 ENCOUNTER — Telehealth: Payer: Self-pay

## 2023-03-09 NOTE — Telephone Encounter (Signed)
Advised pt of bx results/sh ?

## 2023-03-09 NOTE — Telephone Encounter (Signed)
-----   Message from Willeen Niece, MD sent at 03/09/2023  8:37 AM EDT ----- Skin , right lower back BASAL CELL CARCINOMA, NODULAR AND INFILTRATIVE PATTERNS, ULCERATED  BCC skin cancer- already treated with EDC at time of biopsy    - please call patient

## 2023-05-19 ENCOUNTER — Other Ambulatory Visit: Payer: Self-pay | Admitting: Family Medicine

## 2023-07-04 ENCOUNTER — Other Ambulatory Visit: Payer: Self-pay | Admitting: Family Medicine

## 2023-07-05 ENCOUNTER — Ambulatory Visit (INDEPENDENT_AMBULATORY_CARE_PROVIDER_SITE_OTHER): Payer: Medicare Other

## 2023-07-05 ENCOUNTER — Telehealth: Payer: Self-pay | Admitting: Family Medicine

## 2023-07-05 DIAGNOSIS — Z23 Encounter for immunization: Secondary | ICD-10-CM | POA: Diagnosis not present

## 2023-07-05 NOTE — Telephone Encounter (Signed)
Recieved a fax from covermymeds for Testosterone 1.62% Gel/ Has been started   key: [id number]

## 2023-07-06 NOTE — Telephone Encounter (Signed)
PA initiated

## 2023-07-07 NOTE — Telephone Encounter (Signed)
Notes with Testostenone level labs faxed to be review by the plan.

## 2023-07-07 NOTE — Telephone Encounter (Signed)
Outcome Approved today by [employer] [facility] 2017 Approved. Authorization Expiration Date: 07/06/2024 Drug Testosterone 1.62% ge

## 2023-07-28 ENCOUNTER — Ambulatory Visit: Payer: Medicare Other | Admitting: Family Medicine

## 2023-08-03 ENCOUNTER — Ambulatory Visit: Payer: Medicare Other | Admitting: Family Medicine

## 2023-08-10 DIAGNOSIS — I480 Paroxysmal atrial fibrillation: Secondary | ICD-10-CM | POA: Diagnosis not present

## 2023-08-10 DIAGNOSIS — I10 Essential (primary) hypertension: Secondary | ICD-10-CM | POA: Diagnosis not present

## 2023-08-10 DIAGNOSIS — E785 Hyperlipidemia, unspecified: Secondary | ICD-10-CM | POA: Diagnosis not present

## 2023-08-10 DIAGNOSIS — I4510 Unspecified right bundle-branch block: Secondary | ICD-10-CM | POA: Diagnosis not present

## 2023-08-14 ENCOUNTER — Ambulatory Visit (INDEPENDENT_AMBULATORY_CARE_PROVIDER_SITE_OTHER): Payer: Medicare Other | Admitting: Family Medicine

## 2023-08-14 ENCOUNTER — Encounter: Payer: Self-pay | Admitting: Family Medicine

## 2023-08-14 ENCOUNTER — Other Ambulatory Visit: Payer: Self-pay

## 2023-08-14 VITALS — BP 117/69 | HR 65 | Ht 71.0 in | Wt 198.0 lb

## 2023-08-14 DIAGNOSIS — R7989 Other specified abnormal findings of blood chemistry: Secondary | ICD-10-CM

## 2023-08-14 DIAGNOSIS — I10 Essential (primary) hypertension: Secondary | ICD-10-CM | POA: Diagnosis not present

## 2023-08-14 DIAGNOSIS — R972 Elevated prostate specific antigen [PSA]: Secondary | ICD-10-CM | POA: Diagnosis not present

## 2023-08-14 DIAGNOSIS — E785 Hyperlipidemia, unspecified: Secondary | ICD-10-CM

## 2023-08-14 DIAGNOSIS — I480 Paroxysmal atrial fibrillation: Secondary | ICD-10-CM

## 2023-08-14 DIAGNOSIS — E039 Hypothyroidism, unspecified: Secondary | ICD-10-CM | POA: Diagnosis not present

## 2023-08-14 DIAGNOSIS — R7303 Prediabetes: Secondary | ICD-10-CM

## 2023-08-14 DIAGNOSIS — N1831 Chronic kidney disease, stage 3a: Secondary | ICD-10-CM | POA: Diagnosis not present

## 2023-08-14 MED ORDER — THYROID 60 MG PO TABS: 60.0000 mg | ORAL_TABLET | Freq: Every day | ORAL | 2 refills | Status: DC

## 2023-08-14 MED ORDER — TESTOSTERONE 1.62 % TD GEL: TRANSDERMAL | 2 refills | Status: DC

## 2023-08-14 MED ORDER — TADALAFIL 5 MG PO TABS: 5.0000 mg | ORAL_TABLET | Freq: Every day | ORAL | 3 refills | Status: DC

## 2023-08-14 MED ORDER — AMLODIPINE BESY-BENAZEPRIL HCL 5-10 MG PO CAPS: 2.0000 | ORAL_CAPSULE | Freq: Every day | ORAL | 3 refills | Status: DC

## 2023-08-14 NOTE — Assessment & Plan Note (Signed)
Recommend low carb diet A1c recheck today

## 2023-08-14 NOTE — Assessment & Plan Note (Signed)
Chronic and stable Reviewed metabolic panel Avoid nephrotoxic meds  

## 2023-08-14 NOTE — Assessment & Plan Note (Signed)
Has previously seen urology and had negative MRI and biopsy PSA improved on last check We will continue to trend and refer back to urology if it increases significantly

## 2023-08-14 NOTE — Assessment & Plan Note (Signed)
Atrial fibrillation managed by Cardiology with Eliquis 5 mg twice daily. No recent episodes. Eliquis to be held before upcoming colonoscopy. - Continue Eliquis 5 mg twice daily - Hold Eliquis for at least two days before colonoscopy in March - Check blood counts with labs

## 2023-08-14 NOTE — Assessment & Plan Note (Signed)
Hypertension managed with amlodipine and benazepril. Blood pressure well-controlled. Discussed potential leg swelling from increased amlodipine dose. - Continue amlodipine and benazepril 5-10 mg 2 pills daily - Monitor blood pressure - Check kidney and liver function with labs

## 2023-08-14 NOTE — Assessment & Plan Note (Signed)
On AndroGel, 2 pumps each morning. Testosterone levels to be monitored. - Check testosterone levels with labs - Refill AndroGel prescription

## 2023-08-14 NOTE — Assessment & Plan Note (Signed)
Hyperlipidemia managed with rosuvastatin 5 mg daily. Previous cholesterol levels excellent. Discussed CoQ10 supplementation to mitigate statin-associated muscle aches. - Continue rosuvastatin 5 mg daily - Check lipid panel with labs - Recommend daily CoQ10 supplementation, 100-200 mg over the counter

## 2023-08-14 NOTE — Assessment & Plan Note (Signed)
Hypothyroidism managed with Armour thyroid 60 mg daily. Reports good appetite and weight gain. - Continue Armour thyroid 60 mg daily - Check thyroid function with labs - Refill Armour thyroid prescription

## 2023-08-14 NOTE — Progress Notes (Signed)
Established Patient Office Visit  Subjective   Patient ID: Gregory Mckay, male    DOB: 05-Jan-1946  Age: 77 y.o. MRN: [id number]  Chief Complaint  Patient presents with   Medical Management of Chronic Issues    6 month follow-up. Patient reports he is taking medications as prescribed with no symptoms to report.     HPI  Discussed the use of AI scribe software for clinical note transcription with the patient, who gave verbal consent to proceed.  History of Present Illness   The patient, a 77 year old male with a history of hypertension, hypothyroidism, hyperlipidemia, and recent surgery, presents for a chronic disease follow-up. The patient reports feeling back to normal after the surgery, with regained weight and strength. He has a good appetite and no reported problems with his belly, which was a concern in previous visits. The patient is on amlodipine and benazepril for hypertension, Armour thyroid for hypothyroidism, and rosuvastatin for hyperlipidemia. He also takes testosterone, the dose of which is checked regularly. The patient recently started a blood thinner due to a touch of atrial fibrillation. He also takes Cialis and requests a refill. The patient is due for a colonoscopy in March, a year after his operation.         ROS    Objective:     BP 117/69 (BP Location: Left Arm, Patient Position: Sitting, Cuff Size: Large)   Pulse 65   Ht 5\' 11"  (1.803 m)   Wt 198 lb (89.8 kg)   SpO2 100%   BMI 27.62 kg/m    Physical Exam Vitals reviewed.  Constitutional:      General: He is not in acute distress.    Appearance: Normal appearance. He is not diaphoretic.  HENT:     Head: Normocephalic and atraumatic.  Eyes:     General: No scleral icterus.    Conjunctiva/sclera: Conjunctivae normal.  Cardiovascular:     Rate and Rhythm: Normal rate and regular rhythm.     Heart sounds: Normal heart sounds. No murmur heard. Pulmonary:     Effort: Pulmonary effort is normal. No  respiratory distress.     Breath sounds: Normal breath sounds. No wheezing or rhonchi.  Musculoskeletal:     Cervical back: Neck supple.     Right lower leg: No edema.     Left lower leg: No edema.  Lymphadenopathy:     Cervical: No cervical adenopathy.  Skin:    General: Skin is warm and dry.     Findings: No rash.  Neurological:     Mental Status: He is alert and oriented to person, place, and time. Mental status is at baseline.  Psychiatric:        Mood and Affect: Mood normal.        Behavior: Behavior normal.      No results found for any visits on 08/14/23.    The 10-year ASCVD risk score (Arnett DK, et al., 2019) is: 25.1%    Assessment & Plan:   Problem List Items Addressed This Visit       Cardiovascular and Mediastinum   Hypertension - Primary    Hypertension managed with amlodipine and benazepril. Blood pressure well-controlled. Discussed potential leg swelling from increased amlodipine dose. - Continue amlodipine and benazepril 5-10 mg 2 pills daily - Monitor blood pressure - Check kidney and liver function with labs      Relevant Medications   apixaban (ELIQUIS) 5 MG TABS tablet   amLODipine-benazepril (LOTREL) 5-10 MG capsule  tadalafil (CIALIS) 5 MG tablet   Other Relevant Orders   Comprehensive metabolic panel   Paroxysmal atrial fibrillation (HCC)    Atrial fibrillation managed by Cardiology with Eliquis 5 mg twice daily. No recent episodes. Eliquis to be held before upcoming colonoscopy. - Continue Eliquis 5 mg twice daily - Hold Eliquis for at least two days before colonoscopy in March - Check blood counts with labs      Relevant Medications   apixaban (ELIQUIS) 5 MG TABS tablet   amLODipine-benazepril (LOTREL) 5-10 MG capsule   tadalafil (CIALIS) 5 MG tablet     Endocrine   Hypothyroidism    Hypothyroidism managed with Armour thyroid 60 mg daily. Reports good appetite and weight gain. - Continue Armour thyroid 60 mg daily - Check  thyroid function with labs - Refill Armour thyroid prescription      Relevant Medications   thyroid (ARMOUR THYROID) 60 MG tablet   Other Relevant Orders   TSH     Genitourinary   Stage 3a chronic kidney disease (HCC)    Chronic and stable Reviewed metabolic panel Avoid nephrotoxic meds       Relevant Orders   Comprehensive metabolic panel     Other   Elevated PSA    Has previously seen urology and had negative MRI and biopsy PSA improved on last check We will continue to trend and refer back to urology if it increases significantly      Relevant Orders   PSA Total (Reflex To Free)   Hyperlipidemia    Hyperlipidemia managed with rosuvastatin 5 mg daily. Previous cholesterol levels excellent. Discussed CoQ10 supplementation to mitigate statin-associated muscle aches. - Continue rosuvastatin 5 mg daily - Check lipid panel with labs - Recommend daily CoQ10 supplementation, 100-200 mg over the counter      Relevant Medications   apixaban (ELIQUIS) 5 MG TABS tablet   amLODipine-benazepril (LOTREL) 5-10 MG capsule   tadalafil (CIALIS) 5 MG tablet   Other Relevant Orders   Lipid panel   Prediabetes    Recommend low carb diet A1c recheck today      Relevant Orders   Hemoglobin A1c   Low testosterone    On AndroGel, 2 pumps each morning. Testosterone levels to be monitored. - Check testosterone levels with labs - Refill AndroGel prescription      Relevant Orders   Testosterone,Free and Total   PSA Total (Reflex To Free)   CBC w/Diff/Platelet        General Health Maintenance Routine health maintenance discussed. Upcoming colonoscopy in March. Physical exam due in six months. - Schedule colonoscopy in March - Schedule physical exam in six months - Check A1c for prediabetes monitoring - Check PSA levels with labs - Check kidney and liver function with labs - Refill Cialis prescription  Follow-up - Review lab results and adjust medications if necessary -  Schedule follow-up visit in six months for physical exam.        Return in about 6 months (around 02/11/2024) for CPE.    Shirlee Latch, MD

## 2023-08-18 LAB — COMPREHENSIVE METABOLIC PANEL
ALT: 22 [IU]/L (ref 0–44)
AST: 19 [IU]/L (ref 0–40)
Albumin: 4.2 g/dL (ref 3.8–4.8)
Alkaline Phosphatase: 60 [IU]/L (ref 44–121)
BUN/Creatinine Ratio: 18 (ref 10–24)
BUN: 24 mg/dL (ref 8–27)
Bilirubin Total: 0.6 mg/dL (ref 0.0–1.2)
CO2: 22 mmol/L (ref 20–29)
Calcium: 9.7 mg/dL (ref 8.6–10.2)
Chloride: 105 mmol/L (ref 96–106)
Creatinine, Ser: 1.36 mg/dL — ABNORMAL HIGH (ref 0.76–1.27)
Globulin, Total: 2.6 g/dL (ref 1.5–4.5)
Glucose: 101 mg/dL — ABNORMAL HIGH (ref 70–99)
Potassium: 4.6 mmol/L (ref 3.5–5.2)
Sodium: 140 mmol/L (ref 134–144)
Total Protein: 6.8 g/dL (ref 6.0–8.5)
eGFR: 54 mL/min/{1.73_m2} — ABNORMAL LOW (ref >59)

## 2023-08-18 LAB — CBC WITH DIFFERENTIAL/PLATELET
Basophils Absolute: 0.1 10*3/uL (ref 0.0–0.2)
Basos: 2 %
EOS (ABSOLUTE): 0.1 10*3/uL (ref 0.0–0.4)
Eos: 2 %
Hematocrit: 47.2 % (ref 37.5–51.0)
Hemoglobin: 14.1 g/dL (ref 13.0–17.7)
Immature Grans (Abs): 0 10*3/uL (ref 0.0–0.1)
Immature Granulocytes: 0 %
Lymphocytes Absolute: 1.2 10*3/uL (ref 0.7–3.1)
Lymphs: 23 %
MCH: 24.9 pg — ABNORMAL LOW (ref 26.6–33.0)
MCHC: 29.9 g/dL — ABNORMAL LOW (ref 31.5–35.7)
MCV: 83 fL (ref 79–97)
Monocytes Absolute: 0.7 10*3/uL (ref 0.1–0.9)
Monocytes: 13 %
Neutrophils Absolute: 3.3 10*3/uL (ref 1.4–7.0)
Neutrophils: 60 %
Platelets: 193 10*3/uL (ref 150–450)
RBC: 5.66 x10E6/uL (ref 4.14–5.80)
RDW: 14.8 % (ref 11.6–15.4)
WBC: 5.4 10*3/uL (ref 3.4–10.8)

## 2023-08-18 LAB — LIPID PANEL
Chol/HDL Ratio: 5.4 ratio — ABNORMAL HIGH (ref 0.0–5.0)
Cholesterol, Total: 145 mg/dL (ref 100–199)
HDL: 27 mg/dL — ABNORMAL LOW (ref >39)
LDL Chol Calc (NIH): 91 mg/dL (ref 0–99)
Triglycerides: 155 mg/dL — ABNORMAL HIGH (ref 0–149)
VLDL Cholesterol Cal: 27 mg/dL (ref 5–40)

## 2023-08-18 LAB — PSA TOTAL (REFLEX TO FREE): Prostate Specific Ag, Serum: 10.5 ng/mL — ABNORMAL HIGH (ref 0.0–4.0)

## 2023-08-18 LAB — TSH: TSH: 2.09 u[IU]/mL (ref 0.450–4.500)

## 2023-08-18 LAB — HEMOGLOBIN A1C
Est. average glucose Bld gHb Est-mCnc: 120 mg/dL
Hgb A1c MFr Bld: 5.8 % — ABNORMAL HIGH (ref 4.8–5.6)

## 2023-08-18 LAB — TESTOSTERONE,FREE AND TOTAL
Testosterone, Free: 7 pg/mL (ref 6.6–18.1)
Testosterone: 509 ng/dL (ref 264–916)

## 2023-08-31 DIAGNOSIS — I480 Paroxysmal atrial fibrillation: Secondary | ICD-10-CM | POA: Diagnosis not present

## 2023-10-13 DIAGNOSIS — H02132 Senile ectropion of right lower eyelid: Secondary | ICD-10-CM | POA: Diagnosis not present

## 2023-11-13 DIAGNOSIS — H10503 Unspecified blepharoconjunctivitis, bilateral: Secondary | ICD-10-CM | POA: Diagnosis not present

## 2023-11-20 DIAGNOSIS — B880 Other acariasis: Secondary | ICD-10-CM | POA: Diagnosis not present

## 2023-12-05 DIAGNOSIS — B880 Other acariasis: Secondary | ICD-10-CM | POA: Diagnosis not present

## 2023-12-26 ENCOUNTER — Ambulatory Visit (INDEPENDENT_AMBULATORY_CARE_PROVIDER_SITE_OTHER)

## 2023-12-26 DIAGNOSIS — Z0000 Encounter for general adult medical examination without abnormal findings: Secondary | ICD-10-CM | POA: Diagnosis not present

## 2023-12-26 NOTE — Patient Instructions (Addendum)
 Mr. Gregory Mckay , Thank you for taking time to come for your Medicare Wellness Visit. I appreciate your ongoing commitment to your health goals. Please review the following plan we discussed and let me know if I can assist you in the future.   Referrals/Orders/Follow-Ups/Clinician Recommendations: NONE  This is a list of the screening recommended for you and due dates:  Health Maintenance  Topic Date Due   DTaP/Tdap/Td vaccine (1 - Tdap) Never done   COVID-19 Vaccine (3 - Pfizer risk series) 12/02/2019   Colon Cancer Screening  12/07/2023   Flu Shot  04/26/2024   Medicare Annual Wellness Visit  12/25/2024   Pneumonia Vaccine  Completed   Hepatitis C Screening  Completed   Zoster (Shingles) Vaccine  Completed   HPV Vaccine  Aged Out   Cologuard (Stool DNA test)  Discontinued    Advanced directives: (ACP Link)Information on Advanced Care Planning can be found at [facility] [place] Advance Health Care Directives Advance Health Care Directives. [email or web address]   Next Medicare Annual Wellness Visit scheduled for next year: Yes   12/31/24 @ 8:10 AM BY PHONE

## 2023-12-26 NOTE — Progress Notes (Signed)
 Subjective:   Gregory Mckay is a 78 y.o. who presents for a Medicare Wellness preventive visit.  Visit Complete: Virtual I connected with  Gregory Mckay on 12/26/23 by a audio enabled telemedicine application and verified that I am speaking with the correct person using two identifiers.  Patient Location: Home  Provider Location: Office/Clinic  I discussed the limitations of evaluation and management by telemedicine. The patient expressed understanding and agreed to proceed.  Vital Signs: Because this visit was a virtual/telehealth visit, some criteria may be missing or patient reported. Any vitals not documented were not able to be obtained and vitals that have been documented are patient reported.  VideoDeclined- This patient declined Librarian, academic. Therefore the visit was completed with audio only.  Persons Participating in Visit: Patient.  AWV Questionnaire: No: Patient Medicare AWV questionnaire was not completed prior to this visit.  Cardiac Risk Factors include: advanced age (>86men, >54 women);dyslipidemia;male gender;hypertension     Objective:    There were no vitals filed for this visit. There is no height or weight on file to calculate BMI.     12/26/2023    8:22 AM 02/01/2022    9:37 AM 09/10/2020    9:00 AM 09/09/2019    2:39 PM 08/29/2018   10:41 AM 08/01/2018    1:14 PM  Advanced Directives  Does Patient Have a Medical Advance Directive? No No No Yes No No  Type of Theme park manager;Living will    Copy of Healthcare Power of Attorney in Chart?    No - copy requested    Would patient like information on creating a medical advance directive? No - Patient declined No - Patient declined No - Patient declined  No - Patient declined     Current Medications (verified) Outpatient Encounter Medications as of 12/26/2023  Medication Sig   amLODipine-benazepril (LOTREL) 5-10 MG capsule Take 2 capsules by  mouth daily.   apixaban (ELIQUIS) 5 MG TABS tablet Take 5 mg by mouth 2 (two) times daily.   Cholecalciferol (VITAMIN D3) 10000 units TABS Take 1 tablet by mouth daily. Taking at 1:00 am   Coenzyme Q10 (COQ-10) 50 MG CAPS Take 50 mg by mouth daily. Taking at 1:00 am.   Gymnema Sylvestris Leaf POWD Take 400 mg by mouth 2 (two) times daily. Taking at 1:00 am and 1:00 pm   Hydrocortisone Acetate 1 % OINT Apply externally to hemorrhoids BID prn   rosuvastatin (CRESTOR) 5 MG tablet TAKE ONE TABLET BY MOUTH EVERY DAY   tadalafil (CIALIS) 5 MG tablet Take 1 tablet (5 mg total) by mouth daily.   Testosterone 1.62 % GEL APPLY 2 PUMPS TO INNER BICEP OR CHEST EVERY MORNING AS DIRECTED.   thyroid (ARMOUR THYROID) 60 MG tablet Take 1 tablet (60 mg total) by mouth daily before breakfast.   Misc Natural Products (BETA-SITOSTEROL PLANT STEROLS) CAPS Take 60 mg by mouth 2 (two) times daily. Taking at 3:00 and 3:00 pm (Patient not taking: Reported on 12/26/2023)   OVER THE COUNTER MEDICATION Take 750 mg by mouth 2 (two) times daily. Curamed. Taking 1 tablet at 1:00 am, and 1 tablet at 1:00 pm. (Patient not taking: Reported on 12/26/2023)   RESVERATROL PO Take 500 mg by mouth daily. Taking at 1:00 am. (Patient not taking: Reported on 12/26/2023)   tretinoin (RETIN-A) 0.1 % cream APPLY TO FACE AT BEDTIME AND WASH OFF IN THE MORNING (Patient not taking: Reported  on 12/26/2023)   triamcinolone ointment (KENALOG) 0.5 % Apply 1 application topically 2 (two) times daily. (Patient not taking: Reported on 12/26/2023)   vitamin E 400 UNIT capsule Take 400 Units by mouth daily. Taking at 1:00 am (Patient not taking: Reported on 12/26/2023)   No facility-administered encounter medications on file as of 12/26/2023.    Allergies (verified) Patient has no known allergies.   History: Past Medical History:  Diagnosis Date   Basal cell carcinoma 10/08/2019   Right calf. Nodular   Basal cell carcinoma 10/08/2019   Right mid medial dorsum  forearm. Nodular and infiltrative   Basal cell carcinoma 11/05/2019   Left superior pectoral/medial infraclavicular. Nodular.   Basal cell carcinoma 03/02/2023   R lower back, [facility]   Cataract    Hypertension    Thyroid disease    Past Surgical History:  Procedure Laterality Date   COLONOSCOPY WITH PROPOFOL N/A 02/01/2022   Procedure: COLONOSCOPY WITH PROPOFOL;  Surgeon: Wyline Mood, MD;  Location: [facility] ENDOSCOPY;  Service: Gastroenterology;  Laterality: N/A;   EYE SURGERY     HERNIA REPAIR  1998   abdominal   Family History  Problem Relation Age of Onset   Healthy Mother    Healthy Father    Colon cancer Neg Hx    Prostate cancer Neg Hx    Social History   Socioeconomic History   Marital status: Married    Spouse name: Tommie   Number of children: 2   Years of education: Not on file   Highest education level: Associate degree: occupational, Scientist, product/process development, or vocational program  Occupational History   Occupation: President/CEO    Comment: Soil scientist  Tobacco Use   Smoking status: Never   Smokeless tobacco: Never  Vaping Use   Vaping status: Never Used  Substance and Sexual Activity   Alcohol use: No   Drug use: No   Sexual activity: Yes    Partners: Female  Other Topics Concern   Not on file  Social History Narrative   Not on file   Social Drivers of Health   Financial Resource Strain: Low Risk  (12/26/2023)   Overall Financial Resource Strain (CARDIA)    Difficulty of Paying Living Expenses: Not hard at all  Food Insecurity: No Food Insecurity (12/26/2023)   Hunger Vital Sign    Worried About Running Out of Food in the Last Year: Never true    Ran Out of Food in the Last Year: Never true  Transportation Needs: No Transportation Needs (12/26/2023)   PRAPARE - Administrator, Civil Service (Medical): No    Lack of Transportation (Non-Medical): No  Physical Activity: Sufficiently Active (12/26/2023)   Exercise Vital Sign    Days of  Exercise per Week: 4 days    Minutes of Exercise per Session: 60 min  Stress: No Stress Concern Present (12/26/2023)   [employer] of Occupational Health - Occupational Stress Questionnaire    Feeling of Stress : Not at all  Social Connections: Moderately Integrated (12/26/2023)   Social Connection and Isolation Panel [NHANES]    Frequency of Communication with Friends and Family: More than three times a week    Frequency of Social Gatherings with Friends and Family: More than three times a week    Attends Religious Services: More than 4 times per year    Active Member of [employer] or Organizations: No    Attends Banker Meetings: Never    Marital Status: Married  Tobacco Counseling Counseling given: Not Answered    Clinical Intake:  Pre-visit preparation completed: Yes  Pain : No/denies pain     BMI - recorded: 27.6 Nutritional Status: BMI 25 -29 Overweight Nutritional Risks: None Diabetes: No  Lab Results  Component Value Date   HGBA1C 5.8 (H) 08/14/2023   HGBA1C 5.7 (H) 01/24/2023   HGBA1C 5.2 07/15/2022     How often do you need to have someone help you when you read instructions, pamphlets, or other written materials from your doctor or pharmacy?: 1 - Never  Interpreter Needed?: No  Information entered by :: Kennedy Bucker, LPN   Activities of Daily Living    12/26/2023    8:23 AM 01/24/2023    8:51 AM  In your present state of health, do you have any difficulty performing the following activities:  Hearing? 0 1  Vision? 0 0  Difficulty concentrating or making decisions? 0 0  Walking or climbing stairs? 0 0  Dressing or bathing? 0 0  Doing errands, shopping? 0 0  Preparing Food and eating ? N   Using the Toilet? N   In the past six months, have you accidently leaked urine? N   Do you have problems with loss of bowel control? N   Managing your Medications? N   Managing your Finances? N   Housekeeping or managing your Housekeeping? N      Patient Care Team: Erasmo Downer, MD as PCP - General (Family Medicine) Lamar Blinks, MD as Consulting Physician (Cardiology) Orson Ape, MD as Consulting Physician (Urology) Pa, [facility])  Indicate any recent Medical Services you may have received from other than [facility] providers in the past year (date may be approximate).     Assessment:   This is a routine wellness examination for [place].  Hearing/Vision screen Hearing Screening - Comments:: NO AIDS Vision Screening - Comments:: GLASSES WHEN DRIVING- [facility]   Goals Addressed             This Visit's Progress    DIET - EAT MORE FRUITS AND VEGETABLES         Depression Screen     12/26/2023    8:21 AM 01/24/2023    8:51 AM 07/15/2022    8:50 AM 01/11/2022    8:47 AM 05/10/2021    8:34 AM 09/10/2020   10:13 AM 09/10/2020    8:59 AM  PHQ 2/9 Scores  PHQ - 2 Score 0 0 0 0 0 0 0  PHQ- 9 Score 0 0 0 0 0 0     Fall Risk     12/26/2023    8:23 AM 01/24/2023    8:51 AM 07/15/2022    8:50 AM 01/11/2022    8:47 AM 05/10/2021    8:34 AM  Fall Risk   Falls in the past year? 0 0 0 0 0  Number falls in past yr: 0 0 0 0 0  Injury with Fall? 0 0 0 0 0  Risk for fall due to : No Fall Risks No Fall Risks No Fall Risks No Fall Risks No Fall Risks  Follow up Falls prevention discussed;Falls evaluation completed Falls evaluation completed Falls evaluation completed Falls evaluation completed Falls evaluation completed    MEDICARE RISK AT HOME:  Medicare Risk at Home Any stairs in or around the home?: Yes If so, are there any without handrails?: No Home free of loose throw rugs in walkways, pet beds, electrical cords, etc?:  Yes Adequate lighting in your home to reduce risk of falls?: Yes Life alert?: No Use of a cane, walker or w/c?: No Grab bars in the bathroom?: Yes Shower chair or bench in shower?: Yes Elevated toilet seat or a handicapped toilet?: No  TIMED UP AND GO:  Was  the test performed?  No  Cognitive Function: 6CIT completed        12/26/2023    8:25 AM  6CIT Screen  What Year? 0 points  What month? 0 points  What time? 0 points  Count back from 20 0 points  Months in reverse 0 points  Repeat phrase 0 points  Total Score 0 points    Immunizations Immunization History  Administered Date(s) Administered   Fluad Quad(high Dose 65+) 06/04/2019, 06/16/2021, 06/29/2022   Fluad Trivalent(High Dose 65+) 07/05/2023   Influenza-Unspecified 06/09/2020   PFIZER(Purple Top)SARS-COV-2 Vaccination 10/17/2019, 11/04/2019   Pneumococcal Conjugate-13 12/20/2016   Pneumococcal Polysaccharide-23 02/27/2018   Zoster Recombinant(Shingrix) 02/27/2018, 04/30/2018    Screening Tests Health Maintenance  Topic Date Due   DTaP/Tdap/Td (1 - Tdap) Never done   COVID-19 Vaccine (3 - Pfizer risk series) 12/02/2019   Colonoscopy  12/07/2023   INFLUENZA VACCINE  04/26/2024   Medicare Annual Wellness (AWV)  12/25/2024   Pneumonia Vaccine 37+ Years old  Completed   Hepatitis C Screening  Completed   Zoster Vaccines- Shingrix  Completed   HPV VACCINES  Aged Out   Fecal DNA (Cologuard)  Discontinued    Health Maintenance  Health Maintenance Due  Topic Date Due   DTaP/Tdap/Td (1 - Tdap) Never done   COVID-19 Vaccine (3 - Pfizer risk series) 12/02/2019   Colonoscopy  12/07/2023   Health Maintenance Items Addressed: UP TO DATE ON SHOTS EXCEPT TDAP  -  HAS APPT FOR COLONOSCOPY THIS MONTH- UP TO DATE  Additional Screening:  Vision Screening: Recommended annual ophthalmology exams for early detection of glaucoma and other disorders of the eye.  Dental Screening: Recommended annual dental exams for proper oral hygiene  Community Resource Referral / Chronic Care Management: CRR required this visit?  No   CCM required this visit?  No     Plan:     I have personally reviewed and noted the following in the patient's chart:   Medical and social  history Use of alcohol, tobacco or illicit drugs  Current medications and supplements including opioid prescriptions. Patient is not currently taking opioid prescriptions. Functional ability and status Nutritional status Physical activity Advanced directives List of other physicians Hospitalizations, surgeries, and ER visits in previous 12 months Vitals Screenings to include cognitive, depression, and falls Referrals and appointments  In addition, I have reviewed and discussed with patient certain preventive protocols, quality metrics, and best practice recommendations. A written personalized care plan for preventive services as well as general preventive health recommendations were provided to patient.     Hal Hope, LPN   11/26/4399   After Visit Summary: (MyChart) Due to this being a telephonic visit, the after visit summary with patients personalized plan was offered to patient via MyChart   Notes: Nothing significant to report at this time.

## 2024-01-01 DIAGNOSIS — K635 Polyp of colon: Secondary | ICD-10-CM | POA: Diagnosis not present

## 2024-01-01 DIAGNOSIS — K621 Rectal polyp: Secondary | ICD-10-CM | POA: Diagnosis not present

## 2024-01-01 DIAGNOSIS — K5730 Diverticulosis of large intestine without perforation or abscess without bleeding: Secondary | ICD-10-CM | POA: Diagnosis not present

## 2024-01-01 DIAGNOSIS — Z980 Intestinal bypass and anastomosis status: Secondary | ICD-10-CM | POA: Diagnosis not present

## 2024-01-03 DIAGNOSIS — H10503 Unspecified blepharoconjunctivitis, bilateral: Secondary | ICD-10-CM | POA: Diagnosis not present

## 2024-01-17 ENCOUNTER — Other Ambulatory Visit: Payer: Self-pay | Admitting: Family Medicine

## 2024-01-18 NOTE — Telephone Encounter (Signed)
 Requested medication (s) are due for refill today: yes  Requested medication (s) are on the active medication list: yes  Last refill:    Future visit scheduled: yes  Notes to clinic:  Medication not assigned to a protocol, review manually.      Requested Prescriptions  Pending Prescriptions Disp Refills   Testosterone  1.62 % GEL [Pharmacy Med Name: TESTOSTERONE  1.62% GEL GM] 75 g     Sig: APPLY 2 PUMPS TO INNER BICEP OR CHEST EVERY MORNING AS DIRECTED.     Off-Protocol Failed - 01/18/2024 12:10 PM      Failed - Medication not assigned to a protocol, review manually.      Failed - Valid encounter within last 12 months    Recent Outpatient Visits   None     Future Appointments             In 3 weeks Bacigalupo, Stan Eans, MD [facility], [facility]

## 2024-01-22 DIAGNOSIS — B880 Other acariasis: Secondary | ICD-10-CM | POA: Diagnosis not present

## 2024-01-22 DIAGNOSIS — H10503 Unspecified blepharoconjunctivitis, bilateral: Secondary | ICD-10-CM | POA: Diagnosis not present

## 2024-01-29 DIAGNOSIS — I4510 Unspecified right bundle-branch block: Secondary | ICD-10-CM | POA: Diagnosis not present

## 2024-01-29 DIAGNOSIS — I10 Essential (primary) hypertension: Secondary | ICD-10-CM | POA: Diagnosis not present

## 2024-01-29 DIAGNOSIS — I480 Paroxysmal atrial fibrillation: Secondary | ICD-10-CM | POA: Diagnosis not present

## 2024-01-29 DIAGNOSIS — E785 Hyperlipidemia, unspecified: Secondary | ICD-10-CM | POA: Diagnosis not present

## 2024-02-05 ENCOUNTER — Ambulatory Visit: Payer: Self-pay

## 2024-02-05 ENCOUNTER — Encounter: Payer: Self-pay | Admitting: Physician Assistant

## 2024-02-05 ENCOUNTER — Telehealth: Payer: Self-pay | Admitting: Physician Assistant

## 2024-02-05 ENCOUNTER — Ambulatory Visit (INDEPENDENT_AMBULATORY_CARE_PROVIDER_SITE_OTHER): Admitting: Physician Assistant

## 2024-02-05 VITALS — BP 132/72 | HR 67 | Resp 16 | Ht 71.0 in | Wt 200.0 lb

## 2024-02-05 DIAGNOSIS — N1831 Chronic kidney disease, stage 3a: Secondary | ICD-10-CM

## 2024-02-05 DIAGNOSIS — R319 Hematuria, unspecified: Secondary | ICD-10-CM | POA: Insufficient documentation

## 2024-02-05 DIAGNOSIS — N400 Enlarged prostate without lower urinary tract symptoms: Secondary | ICD-10-CM

## 2024-02-05 DIAGNOSIS — N200 Calculus of kidney: Secondary | ICD-10-CM | POA: Diagnosis not present

## 2024-02-05 LAB — POCT URINALYSIS DIPSTICK
Glucose, UA: NEGATIVE
Protein, UA: POSITIVE — AB
Spec Grav, UA: 1.025 (ref 1.010–1.025)
Urobilinogen, UA: 0.2 U/dL
pH, UA: 5 (ref 5.0–8.0)

## 2024-02-05 NOTE — Telephone Encounter (Signed)
 Patient was seen this morning by Janna Ostwalt, PA.

## 2024-02-05 NOTE — Progress Notes (Signed)
 Established patient visit  Patient: Gregory Mckay   DOB: 03-Jun-1946   78 y.o. Male  MRN: [id number] Visit Date: 02/05/2024  Today's healthcare provider: Blane Bunting, PA-C   Chief Complaint  Patient presents with   Hematuria    Blood inn Urine.   Subjective      Discussed the use of AI scribe software for clinical note transcription with the patient, who gave verbal consent to proceed.  History of Present Illness Gregory Mckay is a 78 year old male with chronic kidney disease and an enlarged prostate who presents with maroon-colored urine.  His urine turned maroon yesterday, persisting despite increased water intake. This morning, it appeared more red. He typically consumes about fifty ounces of water daily but may have had less recently. No dysuria, increased frequency, abdominal pain, or hematuria-related pain. He has not eaten foods that alter urine color and has no recent illnesses or medication changes.  He takes Eliquis and a statin, with long-term use of AndroGel . He has kidney stones without obstruction and an enlarged prostate managed with daily Cialis . A Urolift procedure was done in 2019. He has a new urologist appointment in two weeks.  No smoking history or bladder issues. Urinary habits are stable, with urination two to three times daily and none at night. A colonoscopy three weeks ago was normal. PSA was elevated at 10.5 five months ago, with no prostate cancer history. No fever, myalgia, arthralgia, bowel changes, or recent NSAID use. No similar past episodes.       12/26/2023    8:21 AM 01/24/2023    8:51 AM 07/15/2022    8:50 AM  Depression screen PHQ 2/9  Decreased Interest 0 0 0  Down, Depressed, Hopeless 0 0 0  PHQ - 2 Score 0 0 0  Altered sleeping 0 0 0  Tired, decreased energy 0 0 0  Change in appetite 0 0 0  Feeling bad or failure about yourself  0 0 0  Trouble concentrating 0 0 0  Moving slowly or fidgety/restless 0 0 0  Suicidal thoughts 0 0 0   PHQ-9 Score 0 0 0  Difficult doing work/chores Not difficult at all Not difficult at all Not difficult at all       No data to display          Medications: Outpatient Medications Prior to Visit  Medication Sig   amLODipine -benazepril  (LOTREL) 5-10 MG capsule Take 2 capsules by mouth daily.   apixaban (ELIQUIS) 5 MG TABS tablet Take 5 mg by mouth 2 (two) times daily.   Cholecalciferol (VITAMIN D3) 10000 units TABS Take 1 tablet by mouth daily. Taking at 1:00 am   Coenzyme Q10 (COQ-10) 50 MG CAPS Take 50 mg by mouth daily. Taking at 1:00 am.   Gymnema Sylvestris Leaf POWD Take 400 mg by mouth 2 (two) times daily. Taking at 1:00 am and 1:00 pm   Hydrocortisone  Acetate 1 % OINT Apply externally to hemorrhoids BID prn   Misc Natural Products (BETA-SITOSTEROL PLANT STEROLS) CAPS Take 60 mg by mouth 2 (two) times daily. Taking at 3:00 and 3:00 pm (Patient not taking: Reported on 12/26/2023)   OVER THE COUNTER MEDICATION Take 750 mg by mouth 2 (two) times daily. Curamed. Taking 1 tablet at 1:00 am, and 1 tablet at 1:00 pm. (Patient not taking: Reported on 12/26/2023)   RESVERATROL PO Take 500 mg by mouth daily. Taking at 1:00 am. (Patient not taking: Reported on 12/26/2023)   rosuvastatin  (CRESTOR ) 5  MG tablet TAKE ONE TABLET BY MOUTH EVERY DAY   tadalafil  (CIALIS ) 5 MG tablet Take 1 tablet (5 mg total) by mouth daily.   Testosterone  1.62 % GEL APPLY 2 PUMPS TO INNER BICEP OR CHEST EVERY MORNING AS DIRECTED   thyroid  (ARMOUR THYROID ) 60 MG tablet Take 1 tablet (60 mg total) by mouth daily before breakfast.   tretinoin  (RETIN-A ) 0.1 % cream APPLY TO FACE AT BEDTIME AND WASH OFF IN THE MORNING (Patient not taking: Reported on 12/26/2023)   triamcinolone  ointment (KENALOG ) 0.5 % Apply 1 application topically 2 (two) times daily. (Patient not taking: Reported on 12/26/2023)   vitamin E 400 UNIT capsule Take 400 Units by mouth daily. Taking at 1:00 am (Patient not taking: Reported on 12/26/2023)   No  facility-administered medications prior to visit.    Review of Systems All negative Except see HPI       Objective    There were no vitals taken for this visit.    Physical Exam Constitutional:      General: He is not in acute distress.    Appearance: Normal appearance. He is not diaphoretic.  HENT:     Head: Normocephalic.  Eyes:     Conjunctiva/sclera: Conjunctivae normal.  Pulmonary:     Effort: Pulmonary effort is normal. No respiratory distress.  Neurological:     Mental Status: He is alert and oriented to person, place, and time. Mental status is at baseline.      No results found for any visits on 02/05/24.      Assessment & Plan Hematuria Acute maroon-colored urine without pain. Differential includes UTI, nephrolithiasis, or food/medication effects. Urinalysis shows positive nitrate, leukocyte esterase, ketones, bilirubin, and protein. Concern due to elevated PSA and nephrolithiasis history. - Perform urinalysis and microscopy. - Check PSA levels. - Advise increased water intake. - Refer to urologist. Pt was scheduled with Dr. Eligio Grumbling in [place] in 2 weeks.  Enlarged prostate Hx of urolift Managed with daily Cialis . No changes in urinary symptoms. Last PSA elevated at 10.5. Urology follow-up in two weeks. - Check PSA levels. - Continue Cialis .  Kidney stones History of non-obstructive, asymptomatic nephrolithiasis. Urinalysis may show crystals if active. - Include nephrolithiasis evaluation in urinalysis and microscopy.  Chronic kidney disease Stable chronic kidney disease without symptom changes. - Repeat blood work to assess kidney function.    Orders Placed This Encounter  Procedures   Urine Culture   CK (Creatine Kinase)   Basic Metabolic Panel (BMET)   PSA Total (Reflex To Free)   Urinalysis, microscopic only   POCT urinalysis dipstick    No follow-ups on file.   The patient was advised to call back or seek an in-person evaluation if  the symptoms worsen or if the condition fails to improve as anticipated.  I discussed the assessment and treatment plan with the patient. The patient was provided an opportunity to ask questions and all were answered. The patient agreed with the plan and demonstrated an understanding of the instructions.  I, Nateisha Moyd, PA-C have reviewed all documentation for this visit. The documentation on 02/05/2024  for the exam, diagnosis, procedures, and orders are all accurate and complete.  Blane Bunting, [facility], MMS [facility] [phone] (phone) [phone] (fax)  [facility]

## 2024-02-05 NOTE — Telephone Encounter (Signed)
 Chief Complaint: Hematuria Symptoms: blood in urine Frequency: since yesterday Pertinent Negatives: Patient denies see notes Disposition: [] ED /[] Urgent Care (no appt availability in office) / [x] Appointment(In office/virtual)/ []  [place] [facility]/ [] Home Care/ [] Refused Recommended Disposition /[] [facility]/ []  Follow-up with PCP Additional Notes: Patient called in reporting he believes he has blood in urine. Patient states yesterday it was a maroon color to his urine, today it is more blood colored and medium red. Patient is on eliquis. Patient denies pain with urination, pelvic pain, abdominal pain, fever, back pain, vomiting, weakness, muscle pain. Patient appt made for evaluation due to being on blood thinners.    Copied from CRM [id number]. Topic: Clinical - Red Word Triage >> Feb 05, 2024  8:37 AM Carlatta H wrote: Red Word that prompted transfer to Nurse Triage: Patient notice yesterday that there is blood in his urine// Reason for Disposition  Taking Coumadin (warfarin) or other strong blood thinner, or known bleeding disorder (e.g., thrombocytopenia)  Answer Assessment - Initial Assessment Questions 1. COLOR of URINE: "Describe the color of the urine."  (e.g., tea-colored, pink, red, bloody) "Do you have blood clots in your urine?" (e.g., none, pea, grape, small coin)     Yesterday - maroon, today is medium red 2. ONSET: "When did the bleeding start?"      Yesterday  3. EPISODES: "How many times has there been blood in the urine?" or "How many times today?"     Every episode since yesterday  4. PAIN with URINATION: "Is there any pain with passing your urine?" If Yes, ask: "How bad is the pain?"  (Scale 1-10; or mild, moderate, severe)    - MILD: Complains slightly about urination hurting.    - MODERATE: Interferes with normal activities.      - SEVERE: Excruciating, unwilling or unable to urinate because of the pain.      None 5. FEVER: "Do you have a fever?" If  Yes, ask: "What is your temperature, how was it measured, and when did it start?"     No 6. ASSOCIATED SYMPTOMS: "Are you passing urine more frequently than usual?"     No 7. OTHER SYMPTOMS: "Do you have any other symptoms?" (e.g., back/flank pain, abdomen pain, vomiting)     No  Protocols used: Urine - Blood In-A-AH

## 2024-02-05 NOTE — Telephone Encounter (Signed)
 Can he be seen by someone sooner

## 2024-02-05 NOTE — Telephone Encounter (Signed)
 Patient dropped a note by for Janna asking her to right a note stating that he needed an "emergency consultation regarding blood in his urine" and send to Dr. Eligio Grumbling so his office will get his appointment moved up to an earlier date.  The patient already has an appt with Dr. Eligio Grumbling but is wanting it moved up since he is having issues.   Fax to [phone]     ATTN:  New Referral to Dr. Eligio Grumbling  Please let patient know if Janna is going to do this.  [phone]

## 2024-02-06 ENCOUNTER — Encounter: Payer: Self-pay | Admitting: Physician Assistant

## 2024-02-06 LAB — URINALYSIS, MICROSCOPIC ONLY
Bacteria, UA: NONE SEEN
Casts: NONE SEEN /LPF
Epithelial Cells (non renal): NONE SEEN /HPF (ref 0–10)
RBC, Urine: >30 /HPF — AB (ref 0–2)

## 2024-02-06 LAB — PSA TOTAL (REFLEX TO FREE): Prostate Specific Ag, Serum: 10.4 ng/mL — ABNORMAL HIGH (ref 0.0–4.0)

## 2024-02-06 LAB — BASIC METABOLIC PANEL WITH GFR
BUN/Creatinine Ratio: 18 (ref 10–24)
BUN: 24 mg/dL (ref 8–27)
CO2: 20 mmol/L (ref 20–29)
Calcium: 10.4 mg/dL — ABNORMAL HIGH (ref 8.6–10.2)
Chloride: 104 mmol/L (ref 96–106)
Creatinine, Ser: 1.3 mg/dL — ABNORMAL HIGH (ref 0.76–1.27)
Glucose: 89 mg/dL (ref 70–99)
Potassium: 4.5 mmol/L (ref 3.5–5.2)
Sodium: 142 mmol/L (ref 134–144)
eGFR: 57 mL/min/{1.73_m2} — ABNORMAL LOW (ref >59)

## 2024-02-06 LAB — CK: Total CK: 50 U/L (ref 41–331)

## 2024-02-07 ENCOUNTER — Encounter: Payer: Self-pay | Admitting: Physician Assistant

## 2024-02-07 ENCOUNTER — Ambulatory Visit: Payer: Self-pay | Admitting: Physician Assistant

## 2024-02-07 LAB — URINE CULTURE

## 2024-02-08 NOTE — Telephone Encounter (Signed)
 Requested information of letter, ov, med list and problem list have been faxed this morning 5/15 @ 8:21am

## 2024-02-12 ENCOUNTER — Encounter: Payer: Self-pay | Admitting: Family Medicine

## 2024-02-12 ENCOUNTER — Ambulatory Visit (INDEPENDENT_AMBULATORY_CARE_PROVIDER_SITE_OTHER): Payer: Self-pay | Admitting: Family Medicine

## 2024-02-12 VITALS — BP 106/67 | HR 69 | Ht 71.0 in | Wt 195.0 lb

## 2024-02-12 DIAGNOSIS — N1831 Chronic kidney disease, stage 3a: Secondary | ICD-10-CM

## 2024-02-12 DIAGNOSIS — I10 Essential (primary) hypertension: Secondary | ICD-10-CM

## 2024-02-12 DIAGNOSIS — Z0000 Encounter for general adult medical examination without abnormal findings: Secondary | ICD-10-CM

## 2024-02-12 DIAGNOSIS — R7989 Other specified abnormal findings of blood chemistry: Secondary | ICD-10-CM | POA: Diagnosis not present

## 2024-02-12 DIAGNOSIS — R7303 Prediabetes: Secondary | ICD-10-CM | POA: Diagnosis not present

## 2024-02-12 DIAGNOSIS — E785 Hyperlipidemia, unspecified: Secondary | ICD-10-CM | POA: Diagnosis not present

## 2024-02-12 DIAGNOSIS — E039 Hypothyroidism, unspecified: Secondary | ICD-10-CM | POA: Diagnosis not present

## 2024-02-12 DIAGNOSIS — I480 Paroxysmal atrial fibrillation: Secondary | ICD-10-CM

## 2024-02-12 MED ORDER — THYROID 60 MG PO TABS: 60.0000 mg | ORAL_TABLET | Freq: Every day | ORAL | 2 refills | Status: AC

## 2024-02-12 MED ORDER — APIXABAN 5 MG PO TABS: 5.0000 mg | ORAL_TABLET | Freq: Two times a day (BID) | ORAL | 3 refills | Status: DC

## 2024-02-12 MED ORDER — TESTOSTERONE 1.62 % TD GEL: TRANSDERMAL | 2 refills | Status: DC

## 2024-02-12 MED ORDER — ROSUVASTATIN CALCIUM 5 MG PO TABS: 5.0000 mg | ORAL_TABLET | Freq: Every day | ORAL | 3 refills | Status: AC

## 2024-02-12 MED ORDER — AMLODIPINE BESY-BENAZEPRIL HCL 5-10 MG PO CAPS: 2.0000 | ORAL_CAPSULE | Freq: Every day | ORAL | 3 refills | Status: AC

## 2024-02-12 MED ORDER — TADALAFIL 5 MG PO TABS: 5.0000 mg | ORAL_TABLET | Freq: Every day | ORAL | 3 refills | Status: AC

## 2024-02-12 NOTE — Assessment & Plan Note (Signed)
 Chronic and stable as of last labs. -CMP ordered -avoid prescribing nephrotoxic medications

## 2024-02-12 NOTE — Progress Notes (Signed)
 Complete physical exam  Patient: Gregory Mckay   DOB: 1946-03-08   78 y.o. Male  MRN: [id number]  Subjective:     Chief Complaint  Patient presents with   Annual Exam    No concerns    Care Management    Pattern of eating  General "watching his sugar intake"  Are you exercising:yes  What exercising: lift weights   How long: 45 min to an hour    How frequent: twice a weeks with a trainer he has been doing this for 25 years   Denied Vaccines      Gregory Mckay is a 78 y.o. male who presents today for a complete physical exam. He reports consuming a low sugar diet  diet as he is trying to lose weight. He is down to 195 from 202 and would like to get down to a maintenance weight of 190. He generally feels well. He reports sleeping well, waking a few times a night for urination. Since his last visit, he did have a colonoscopy last month and reports his GI doctor said it would be 5-years til he needs his next.Aaron AasHis acute concerns include recent hematuria. He also did wish to see if some bilateral, lateral hip pain that he feels while walking on the beach is possibly related to his Crestor . Last week, he noticed some blood in his urine and came in to get labs done. The results dis show RBCs on urinalysis and were negative for UTI. He called his urologist at [facility] who was wondering if his hematuria may be due to him being dehydrated on blood thinners. He stopped Eliquis  for 2 days and his hematuria resolved. He resumed taking his Eliquis  after those 2 days and hasn't had any episodes since. He is following up with his urologist in about a week. He denies any fevers/chills, unintentional weight loss and back pain.  Most recent fall risk assessment:    12/26/2023    8:23 AM  Fall Risk   Falls in the past year? 0  Number falls in past yr: 0  Injury with Fall? 0  Risk for fall due to : No Fall Risks  Follow up Falls prevention discussed;Falls evaluation completed     Most recent depression  screenings:    02/12/2024    9:38 AM 12/26/2023    8:21 AM  PHQ 2/9 Scores  PHQ - 2 Score 0 0  PHQ- 9 Score 0 0    PSA: Agrees to PSA testing, Most recent PSA obtained 02/05/24.  Past Medical History:  Diagnosis Date   Basal cell carcinoma 10/08/2019   Right calf. Nodular   Basal cell carcinoma 10/08/2019   Right mid medial dorsum forearm. Nodular and infiltrative   Basal cell carcinoma 11/05/2019   Left superior pectoral/medial infraclavicular. Nodular.   Basal cell carcinoma 03/02/2023   R lower back, [facility]   Cataract    Hypertension    Thyroid  disease    Past Surgical History:  Procedure Laterality Date   COLONOSCOPY WITH PROPOFOL  N/A 02/01/2022   Procedure: COLONOSCOPY WITH PROPOFOL ;  Surgeon: Luke Salaam, MD;  Location: [facility] ENDOSCOPY;  Service: Gastroenterology;  Laterality: N/A;   EYE SURGERY     HERNIA REPAIR  1998   abdominal     Patient Care Team: Mazie Speed, MD as PCP - General (Family Medicine) Michelle Aid, MD as Consulting Physician (Cardiology) Rea Cambridge, MD as Consulting Physician (Urology) [facility], [facility])   Outpatient  Medications Prior to Visit  Medication Sig   Cholecalciferol (VITAMIN D3) 10000 units TABS Take 1 tablet by mouth daily. Taking at 1:00 am   Coenzyme Q10 (COQ-10) 50 MG CAPS Take 50 mg by mouth daily. Taking at 1:00 am.   Gymnema Sylvestris Leaf POWD Take 400 mg by mouth 2 (two) times daily. Taking at 1:00 am and 1:00 pm   Hydrocortisone  Acetate 1 % OINT Apply externally to hemorrhoids BID prn   [DISCONTINUED] amLODipine -benazepril  (LOTREL) 5-10 MG capsule Take 2 capsules by mouth daily.   [DISCONTINUED] apixaban  (ELIQUIS ) 5 MG TABS tablet Take 5 mg by mouth 2 (two) times daily.   [DISCONTINUED] rosuvastatin  (CRESTOR ) 5 MG tablet TAKE ONE TABLET BY MOUTH EVERY DAY   [DISCONTINUED] tadalafil  (CIALIS ) 5 MG tablet Take 1 tablet (5 mg total) by mouth daily.   [DISCONTINUED] Testosterone  1.62 % GEL APPLY 2  PUMPS TO INNER BICEP OR CHEST EVERY MORNING AS DIRECTED   [DISCONTINUED] thyroid  (ARMOUR THYROID ) 60 MG tablet Take 1 tablet (60 mg total) by mouth daily before breakfast.   No facility-administered medications prior to visit.   Review of Systems  Constitutional:  Positive for weight loss. Negative for chills and fever.       Intentionally working out to lose weight  HENT:  Negative for congestion, ear pain, hearing loss and sinus pain.   Eyes: Negative.   Respiratory:  Negative for cough and shortness of breath.   Cardiovascular:  Negative for chest pain and palpitations.  Gastrointestinal:  Negative for blood in stool, constipation, diarrhea, heartburn and nausea.  Genitourinary:  Positive for hematuria.  Musculoskeletal:  Positive for joint pain.       Bilateral hip tenderness  Neurological:  Negative for dizziness, tingling, sensory change, weakness and headaches.  Psychiatric/Behavioral:  Negative for depression. The patient does not have insomnia.       Objective:     BP 106/67   Pulse 69   Ht 5\' 11"  (1.803 m)   Wt 195 lb (88.5 kg)   SpO2 98%   BMI 27.20 kg/m  BP Readings from Last 3 Encounters:  02/12/24 106/67  02/05/24 132/72  08/14/23 117/69   Wt Readings from Last 3 Encounters:  02/12/24 195 lb (88.5 kg)  02/05/24 200 lb (90.7 kg)  08/14/23 198 lb (89.8 kg)     Physical Exam Constitutional:      General: He is not in acute distress.    Appearance: Normal appearance. He is normal weight.  HENT:     Head: Normocephalic and atraumatic.     Right Ear: External ear normal.     Left Ear: External ear normal.     Nose: Nose normal. No congestion.     Mouth/Throat:     Mouth: Mucous membranes are moist.     Pharynx: Oropharynx is clear. No oropharyngeal exudate.  Eyes:     General: No scleral icterus.    Extraocular Movements: Extraocular movements intact.     Conjunctiva/sclera: Conjunctivae normal.     Pupils: Pupils are equal, round, and reactive to  light.  Cardiovascular:     Rate and Rhythm: Normal rate and regular rhythm.     Pulses: Normal pulses.     Heart sounds: Normal heart sounds. No murmur heard.    No friction rub. No gallop.  Pulmonary:     Effort: Pulmonary effort is normal. No respiratory distress.     Breath sounds: Normal breath sounds. No wheezing.  Abdominal:  General: Abdomen is flat.     Palpations: Abdomen is soft.     Tenderness: There is no abdominal tenderness.  Musculoskeletal:        General: No swelling or tenderness. Normal range of motion.     Cervical back: Normal range of motion. No rigidity.  Skin:    General: Skin is warm and dry.     Coloration: Skin is not jaundiced.     Findings: No bruising or rash.  Neurological:     General: No focal deficit present.     Mental Status: He is alert and oriented to person, place, and time. Mental status is at baseline.     Motor: No weakness.  Psychiatric:        Mood and Affect: Mood normal.        Behavior: Behavior normal.        Thought Content: Thought content normal.     No results found for any visits on 02/12/24.0 Last CBC Lab Results  Component Value Date   WBC 5.4 08/14/2023   HGB 14.1 08/14/2023   HCT 47.2 08/14/2023   MCV 83 08/14/2023   MCH 24.9 (L) 08/14/2023   RDW 14.8 08/14/2023   PLT 193 08/14/2023   Last metabolic panel Lab Results  Component Value Date   GLUCOSE 89 02/05/2024   NA 142 02/05/2024   K 4.5 02/05/2024   CL 104 02/05/2024   CO2 20 02/05/2024   BUN 24 02/05/2024   CREATININE 1.30 (H) 02/05/2024   EGFR 57 (L) 02/05/2024   CALCIUM  10.4 (H) 02/05/2024   PROT 6.8 08/14/2023   ALBUMIN 4.2 08/14/2023   LABGLOB 2.6 08/14/2023   AGRATIO 1.7 09/10/2020   BILITOT 0.6 08/14/2023   ALKPHOS 60 08/14/2023   AST 19 08/14/2023   ALT 22 08/14/2023   ANIONGAP 8 08/01/2018   Last lipids Lab Results  Component Value Date   CHOL 145 08/14/2023   HDL 27 (L) 08/14/2023   LDLCALC 91 08/14/2023   TRIG 155 (H)  08/14/2023   CHOLHDL 5.4 (H) 08/14/2023   Last hemoglobin A1c Lab Results  Component Value Date   HGBA1C 5.8 (H) 08/14/2023   Last thyroid  functions Lab Results  Component Value Date   TSH 2.090 08/14/2023        Assessment & Plan:    Routine Health Maintenance and Physical Exam  Immunization History  Administered Date(s) Administered   Fluad Quad(high Dose 65+) 06/04/2019, 06/16/2021, 06/29/2022   Fluad Trivalent(High Dose 65+) 07/05/2023   Influenza-Unspecified 06/09/2020   PFIZER(Purple Top)SARS-COV-2 Vaccination 10/17/2019, 11/04/2019, 06/27/2020   Pfizer Covid-19 Vaccine Bivalent Booster 23yrs & up 07/27/2021   Pneumococcal Conjugate-13 12/20/2016   Pneumococcal Polysaccharide-23 02/27/2018   Zoster Recombinant(Shingrix ) 02/27/2018, 04/30/2018    Health Maintenance  Topic Date Due   DTaP/Tdap/Td (1 - Tdap) Never done   COVID-19 Vaccine (5 - 2024-25 season) 05/28/2023   Colonoscopy  12/07/2023   INFLUENZA VACCINE  04/26/2024   Medicare Annual Wellness (AWV)  12/25/2024   Pneumonia Vaccine 49+ Years old  Completed   Hepatitis C Screening  Completed   Zoster Vaccines- Shingrix   Completed   HPV VACCINES  Aged Out   Meningococcal B Vaccine  Aged Out   Fecal DNA (Cologuard)  Discontinued    Discussed health benefits of physical activity, and encouraged him to engage in regular exercise appropriate for his age and condition.  Problem List Items Addressed This Visit       Cardiovascular and Mediastinum  Hypertension   Hypertension managed with amlodipine  and benazepril . Blood pressure well-controlled. No increased leg swelling appreciated today. - Continue amlodipine  and benazepril  5-10 mg 2 pills daily - Continue to monitor blood pressure - CMP ordered        Relevant Medications   amLODipine -benazepril  (LOTREL) 5-10 MG capsule   apixaban  (ELIQUIS ) 5 MG TABS tablet   rosuvastatin  (CRESTOR ) 5 MG tablet   tadalafil  (CIALIS ) 5 MG tablet   Other Relevant  Orders   Comprehensive metabolic panel with GFR   Paroxysmal atrial fibrillation (HCC)   Atrial fibrillation managed by Cardiology with Eliquis  5 mg twice daily.Has not felt chest pain, palpitations, or dizziness. He did temporarily stop his eliquis  after an episode of hematuria and that seemed to resolve it. He has been back on eliquis  after a 2-day hiatus. - Continue Eliquis  5 mg twice daily - CBC ordered - eliquis  refilled       Relevant Medications   amLODipine -benazepril  (LOTREL) 5-10 MG capsule   apixaban  (ELIQUIS ) 5 MG TABS tablet   rosuvastatin  (CRESTOR ) 5 MG tablet   tadalafil  (CIALIS ) 5 MG tablet   Other Relevant Orders   CBC w/Diff/Platelet     Endocrine   Hypothyroidism   Hypothyroidism managed well with Armour thyroid  60 mg daily. No concerns reported and no associated symptoms. - Continue Armour thyroid  60 mg daily - TSH ordered - Refill Armour thyroid  prescription        Relevant Medications   thyroid  (ARMOUR THYROID ) 60 MG tablet   Other Relevant Orders   TSH + free T4     Genitourinary   Stage 3a chronic kidney disease (HCC)   Chronic and stable as of last labs. -CMP ordered -avoid prescribing nephrotoxic medications      Relevant Orders   Comprehensive metabolic panel with GFR     Other   Hyperlipidemia   Hyperlipidemia managed with rosuvastatin  5 mg daily. Recent cholesterol levels excellent. Patient was curious if his bilateral lateral hip pain when walking is related to his statin. Patient informed it is likely more musculoskeletal in nature as it is only occurring when he is walking on the beach and resolves with brief rest. No radicular symptoms.  - Continue rosuvastatin  5 mg daily - Check lipid panel with labs       Relevant Medications   amLODipine -benazepril  (LOTREL) 5-10 MG capsule   apixaban  (ELIQUIS ) 5 MG TABS tablet   rosuvastatin  (CRESTOR ) 5 MG tablet   tadalafil  (CIALIS ) 5 MG tablet   Other Relevant Orders   Lipid Panel With  LDL/HDL Ratio   Comprehensive metabolic panel with GFR   Prediabetes   Patient is eating a low sugar/carb diet and has successfully lost some weight.  -Will continue to monitor A1c      Relevant Orders   HgB A1c   Low testosterone    On AndroGel , 2 pumps each morning. Testosterone  levels are being monitored. - Check testosterone  levels  - Refill AndroGel  prescription      Relevant Orders   Testosterone    Other Visit Diagnoses       Encounter for annual physical exam    -  Primary      Return in about 6 months (around 08/14/2024) for chronic disease f/u.     Monda Angry, Medical Student    Patient seen along with MS3 student, Luther Saltness. I personally evaluated this patient along with the student, and verified all aspects of the history, physical exam, and medical decision making as documented  by the student. I agree with the student's documentation and have made all necessary edits.  Remon Quinto, Stan Eans, MD, MPH [facility]

## 2024-02-12 NOTE — Assessment & Plan Note (Signed)
 Hypothyroidism managed well with Armour thyroid  60 mg daily. No concerns reported and no associated symptoms. - Continue Armour thyroid  60 mg daily - TSH ordered - Refill Armour thyroid  prescription

## 2024-02-12 NOTE — Assessment & Plan Note (Signed)
 On AndroGel , 2 pumps each morning. Testosterone  levels are being monitored. - Check testosterone  levels  - Refill AndroGel  prescription

## 2024-02-12 NOTE — Assessment & Plan Note (Signed)
 Hypertension managed with amlodipine  and benazepril . Blood pressure well-controlled. No increased leg swelling appreciated today. - Continue amlodipine  and benazepril  5-10 mg 2 pills daily - Continue to monitor blood pressure - CMP ordered

## 2024-02-12 NOTE — Assessment & Plan Note (Signed)
 Patient is eating a low sugar/carb diet and has successfully lost some weight.  -Will continue to monitor A1c

## 2024-02-12 NOTE — Assessment & Plan Note (Addendum)
 Atrial fibrillation managed by Cardiology with Eliquis  5 mg twice daily.Has not felt chest pain, palpitations, or dizziness. He did temporarily stop his eliquis  after an episode of hematuria and that seemed to resolve it. He has been back on eliquis  after a 2-day hiatus. - Continue Eliquis  5 mg twice daily - CBC ordered - eliquis  refilled

## 2024-02-12 NOTE — Assessment & Plan Note (Signed)
 Hyperlipidemia managed with rosuvastatin  5 mg daily. Recent cholesterol levels excellent. Patient was curious if his bilateral lateral hip pain when walking is related to his statin. Patient informed it is likely more musculoskeletal in nature as it is only occurring when he is walking on the beach and resolves with brief rest. No radicular symptoms.  - Continue rosuvastatin  5 mg daily - Check lipid panel with labs

## 2024-02-13 ENCOUNTER — Ambulatory Visit: Payer: Self-pay | Admitting: Family Medicine

## 2024-02-13 LAB — CBC WITH DIFFERENTIAL/PLATELET
Basophils Absolute: 0.1 10*3/uL (ref 0.0–0.2)
Basos: 1 %
EOS (ABSOLUTE): 0.1 10*3/uL (ref 0.0–0.4)
Eos: 1 %
Hematocrit: 54 % — ABNORMAL HIGH (ref 37.5–51.0)
Hemoglobin: 16.8 g/dL (ref 13.0–17.7)
Immature Grans (Abs): 0 10*3/uL (ref 0.0–0.1)
Immature Granulocytes: 0 %
Lymphocytes Absolute: 1.2 10*3/uL (ref 0.7–3.1)
Lymphs: 18 %
MCH: 26.3 pg — ABNORMAL LOW (ref 26.6–33.0)
MCHC: 31.1 g/dL — ABNORMAL LOW (ref 31.5–35.7)
MCV: 85 fL (ref 79–97)
Monocytes Absolute: 0.9 10*3/uL (ref 0.1–0.9)
Monocytes: 12 %
Neutrophils Absolute: 4.7 10*3/uL (ref 1.4–7.0)
Neutrophils: 68 %
Platelets: 174 10*3/uL (ref 150–450)
RBC: 6.39 x10E6/uL — ABNORMAL HIGH (ref 4.14–5.80)
RDW: 17.2 % — ABNORMAL HIGH (ref 11.6–15.4)
WBC: 7 10*3/uL (ref 3.4–10.8)

## 2024-02-13 LAB — COMPREHENSIVE METABOLIC PANEL WITH GFR
ALT: 22 IU/L (ref 0–44)
AST: 16 IU/L (ref 0–40)
Albumin: 4.3 g/dL (ref 3.8–4.8)
Alkaline Phosphatase: 67 IU/L (ref 44–121)
BUN/Creatinine Ratio: 24 (ref 10–24)
BUN: 32 mg/dL — ABNORMAL HIGH (ref 8–27)
Bilirubin Total: 0.6 mg/dL (ref 0.0–1.2)
CO2: 20 mmol/L (ref 20–29)
Calcium: 10.4 mg/dL — ABNORMAL HIGH (ref 8.6–10.2)
Chloride: 104 mmol/L (ref 96–106)
Creatinine, Ser: 1.34 mg/dL — ABNORMAL HIGH (ref 0.76–1.27)
Globulin, Total: 2.6 g/dL (ref 1.5–4.5)
Glucose: 96 mg/dL (ref 70–99)
Potassium: 4.6 mmol/L (ref 3.5–5.2)
Sodium: 138 mmol/L (ref 134–144)
Total Protein: 6.9 g/dL (ref 6.0–8.5)
eGFR: 55 mL/min/{1.73_m2} — ABNORMAL LOW (ref >59)

## 2024-02-13 LAB — LIPID PANEL WITH LDL/HDL RATIO
Cholesterol, Total: 141 mg/dL (ref 100–199)
HDL: 28 mg/dL — ABNORMAL LOW (ref >39)
LDL Chol Calc (NIH): 79 mg/dL (ref 0–99)
LDL/HDL Ratio: 2.8 ratio (ref 0.0–3.6)
Triglycerides: 197 mg/dL — ABNORMAL HIGH (ref 0–149)
VLDL Cholesterol Cal: 34 mg/dL (ref 5–40)

## 2024-02-13 LAB — HEMOGLOBIN A1C
Est. average glucose Bld gHb Est-mCnc: 123 mg/dL
Hgb A1c MFr Bld: 5.9 % — ABNORMAL HIGH (ref 4.8–5.6)

## 2024-02-13 LAB — TSH+FREE T4
Free T4: 1.03 ng/dL (ref 0.82–1.77)
TSH: 1.66 u[IU]/mL (ref 0.450–4.500)

## 2024-02-13 LAB — TESTOSTERONE: Testosterone: 13 ng/dL — ABNORMAL LOW (ref 264–916)

## 2024-02-21 DIAGNOSIS — R350 Frequency of micturition: Secondary | ICD-10-CM | POA: Diagnosis not present

## 2024-02-21 DIAGNOSIS — R310 Gross hematuria: Secondary | ICD-10-CM | POA: Diagnosis not present

## 2024-02-21 DIAGNOSIS — N401 Enlarged prostate with lower urinary tract symptoms: Secondary | ICD-10-CM | POA: Diagnosis not present

## 2024-02-21 DIAGNOSIS — R3915 Urgency of urination: Secondary | ICD-10-CM | POA: Diagnosis not present

## 2024-02-28 DIAGNOSIS — R599 Enlarged lymph nodes, unspecified: Secondary | ICD-10-CM | POA: Diagnosis not present

## 2024-02-28 DIAGNOSIS — N200 Calculus of kidney: Secondary | ICD-10-CM | POA: Diagnosis not present

## 2024-02-28 DIAGNOSIS — K5730 Diverticulosis of large intestine without perforation or abscess without bleeding: Secondary | ICD-10-CM | POA: Diagnosis not present

## 2024-02-28 DIAGNOSIS — R350 Frequency of micturition: Secondary | ICD-10-CM | POA: Diagnosis not present

## 2024-02-28 DIAGNOSIS — R3915 Urgency of urination: Secondary | ICD-10-CM | POA: Diagnosis not present

## 2024-02-28 DIAGNOSIS — R310 Gross hematuria: Secondary | ICD-10-CM | POA: Diagnosis not present

## 2024-02-28 DIAGNOSIS — N401 Enlarged prostate with lower urinary tract symptoms: Secondary | ICD-10-CM | POA: Diagnosis not present

## 2024-03-13 DIAGNOSIS — N401 Enlarged prostate with lower urinary tract symptoms: Secondary | ICD-10-CM | POA: Diagnosis not present

## 2024-03-20 DIAGNOSIS — H532 Diplopia: Secondary | ICD-10-CM | POA: Diagnosis not present

## 2024-03-21 ENCOUNTER — Ambulatory Visit: Payer: Self-pay

## 2024-03-21 NOTE — Telephone Encounter (Signed)
  Patient was informed Dr B is out of the office and no availability with any of the other providers. He was advised to be evaluated at [facility], ER or at his eye doctor if he has one and can get in.

## 2024-03-21 NOTE — Telephone Encounter (Signed)
 FYI Only or Action Required?: Action required by provider: clinical question for provider and update on patient condition.  Patient was last seen in primary care on 02/12/2024 by Gregory Jon HERO, MD. Called Nurse Triage reporting Eye Problem. Symptoms began a week ago. Interventions attempted: Nothing. Symptoms are: double vision, blurred vision, dizziness (intermittent) unchanged.  Triage Disposition: Go to ED or PCP/Alternative with Approval- Refused  Patient/caregiver understands and will follow disposition?: No, wishes to speak with PCP                   Copied from CRM [id number]. Topic: Clinical - Red Word Triage >> Mar 21, 2024  7:49 AM Berwyn MATSU wrote: Red Word that prompted transfer to Nurse Triage: blurred vision and side effects from meds,. Reason for Disposition  Double vision  Answer Assessment - Initial Assessment Questions Patient states he saw the urologist 2 weeks ago and was placed on Vilamit MB and thinks that is the cause of his symptoms so he stopped taking the medication on 03/13/24. He states he saw the eye specialist yesterday who told him to give it more time for the symptoms to clear up. He states yesterday the symptoms were more severe. Advised ED for new changes in vision with dizziness and patient declined ED, he states he would like us  to review the medication (the website recommends if you have dizziness and blurred vision to call you doctor) and given him advice. Called [facility] and notified staff of ED refusal.    1. DESCRIPTION: How has your vision changed? (e.g., complete vision loss, blurred vision, double vision, floaters, etc.)     Blurred vision, double vision. Distorted vision.  2. LOCATION: One or both eyes? If one, ask: Which eye?     Both eyes. Mostly the right eye.  3. SEVERITY: Can you see anything? If Yes, ask: What can you see? (e.g., fine print)     Yes, he states he can see including fine print for reading.  4. ONSET:  When did this begin? Did it start suddenly or has this been gradual?     Wednesday 03/13/24. Gradual.  5. PATTERN: Does this come and go, or has it been constant since it started?     Comes and goes. He states looking further away it is clear but closer up is blurred.  6. PAIN: Is there any pain in your eye(s)?  (Scale 1-10; or mild, moderate, severe)   - NONE (0): No pain.   - MILD (1-3): Doesn't interfere with normal activities.   - MODERATE (4-7): Interferes with normal activities or awakens from sleep.    - SEVERE (8-10): Excruciating pain, unable to do any normal activities.     No.  7. CONTACTS-GLASSES: Do you wear contacts or glasses?     No.  8. CAUSE: What do you think is causing this visual problem?     He thinks it is caused by the medication Vilamit.  9. OTHER SYMPTOMS: Do you have any other symptoms? (e.g., confusion, headache, arm or leg weakness, speech problems)     Dizziness intermittent.Patient denies headaches, unilateral numbness or weakness, changes in speech, facial droop, confusion.  10. PREGNANCY: Is there any chance you are pregnant? When was your last menstrual period?       N/a.  Protocols used: Vision Loss or Change-A-AH

## 2024-03-27 ENCOUNTER — Other Ambulatory Visit
Admission: RE | Admit: 2024-03-27 | Discharge: 2024-03-27 | Disposition: A | Discharge status: Home / Self Care | Source: Ambulatory Visit | Attending: Ophthalmology | Admitting: Ophthalmology

## 2024-03-27 DIAGNOSIS — H532 Diplopia: Secondary | ICD-10-CM | POA: Insufficient documentation

## 2024-03-28 ENCOUNTER — Other Ambulatory Visit: Payer: Self-pay | Admitting: Ophthalmology

## 2024-03-28 DIAGNOSIS — H532 Diplopia: Secondary | ICD-10-CM

## 2024-03-28 DIAGNOSIS — N320 Bladder-neck obstruction: Secondary | ICD-10-CM | POA: Diagnosis not present

## 2024-03-28 DIAGNOSIS — R338 Other retention of urine: Secondary | ICD-10-CM | POA: Diagnosis not present

## 2024-03-28 DIAGNOSIS — N401 Enlarged prostate with lower urinary tract symptoms: Secondary | ICD-10-CM | POA: Diagnosis not present

## 2024-03-28 DIAGNOSIS — I771 Stricture of artery: Secondary | ICD-10-CM | POA: Diagnosis not present

## 2024-03-28 LAB — ACETYLCHOLINE RECEPTOR, BINDING: Acety choline binding ab: <0.07 nmol/L (ref 0.00–0.24)

## 2024-04-09 ENCOUNTER — Other Ambulatory Visit: Payer: Self-pay | Admitting: Ophthalmology

## 2024-04-09 DIAGNOSIS — H532 Diplopia: Secondary | ICD-10-CM

## 2024-04-10 ENCOUNTER — Ambulatory Visit
Admission: RE | Admit: 2024-04-10 | Discharge: 2024-04-10 | Disposition: A | Discharge status: Home / Self Care | Source: Ambulatory Visit | Attending: Ophthalmology | Admitting: Ophthalmology

## 2024-04-10 DIAGNOSIS — H532 Diplopia: Secondary | ICD-10-CM | POA: Insufficient documentation

## 2024-04-10 DIAGNOSIS — I6782 Cerebral ischemia: Secondary | ICD-10-CM | POA: Diagnosis not present

## 2024-04-11 ENCOUNTER — Ambulatory Visit

## 2024-04-22 ENCOUNTER — Ambulatory Visit: Payer: Self-pay

## 2024-04-22 NOTE — Telephone Encounter (Signed)
 Noted

## 2024-04-22 NOTE — Telephone Encounter (Signed)
 FYI Only or Action Required?: FYI only for provider.  Patient was last seen in primary care on 02/12/2024 by Myrla Jon HERO, MD.  Called Nurse Triage reporting Back Pain.  Symptoms began several weeks ago.  Interventions attempted: Nothing.  Symptoms are: unchanged.  Triage Disposition: See PCP When Office is Open (Within 3 Days)  Patient/caregiver understands and will follow disposition?: Yes  Copied from CRM [id number]. Topic: Clinical - Red Word Triage >> Apr 22, 2024  8:27 AM Mia F wrote: Red Word that prompted transfer to Nurse Triage: Pain on left side that comes and goes. Pain has been going on for a couple of weeks. Not worsening but come and goes. No other symptoms. Reason for Disposition  [1] MODERATE back pain (e.g., interferes with normal activities) AND [2] present > 3 days  Answer Assessment - Initial Assessment Questions 1. ONSET: When did the pain begin? (e.g., minutes, hours, days)     Started a couple of weeks ago 2. LOCATION: Where does it hurt? (upper, mid or lower back)     Lower back on the left side 3. SEVERITY: How bad is the pain?  (e.g., Scale 1-10; mild, moderate, or severe)     Mild pain 4. PATTERN: Is the pain constant? (e.g., yes, no; constant, intermittent)      intermittent 5. RADIATION: Does the pain shoot into your legs or somewhere else?     No radiation 6. CAUSE:  What do you think is causing the back pain?      Unsure of what could be causing the pain 7. BACK OVERUSE:  Any recent lifting of heavy objects, strenuous work or exercise?     no 8. MEDICINES: What have you taken so far for the pain? (e.g., nothing, acetaminophen , NSAIDS)     nothing 9. NEUROLOGIC SYMPTOMS: Do you have any weakness, numbness, or problems with bowel/bladder control?     no 10. OTHER SYMPTOMS: Do you have any other symptoms? (e.g., fever, abdomen pain, burning with urination, blood in urine)       no  Protocols used: Back Pain-A-AH

## 2024-04-23 ENCOUNTER — Ambulatory Visit (INDEPENDENT_AMBULATORY_CARE_PROVIDER_SITE_OTHER): Admitting: Family Medicine

## 2024-04-23 ENCOUNTER — Encounter: Payer: Self-pay | Admitting: Family Medicine

## 2024-04-23 ENCOUNTER — Ambulatory Visit
Admission: RE | Admit: 2024-04-23 | Discharge: 2024-04-23 | Disposition: A | Discharge status: Home / Self Care | Source: Ambulatory Visit | Attending: Family Medicine | Admitting: Family Medicine

## 2024-04-23 VITALS — BP 117/72 | HR 72 | Temp 97.5°F | Ht 71.0 in | Wt 193.0 lb

## 2024-04-23 DIAGNOSIS — K5730 Diverticulosis of large intestine without perforation or abscess without bleeding: Secondary | ICD-10-CM | POA: Diagnosis not present

## 2024-04-23 DIAGNOSIS — K8020 Calculus of gallbladder without cholecystitis without obstruction: Secondary | ICD-10-CM | POA: Diagnosis not present

## 2024-04-23 DIAGNOSIS — N200 Calculus of kidney: Secondary | ICD-10-CM | POA: Diagnosis not present

## 2024-04-23 DIAGNOSIS — R109 Unspecified abdominal pain: Secondary | ICD-10-CM

## 2024-04-23 DIAGNOSIS — R1012 Left upper quadrant pain: Secondary | ICD-10-CM | POA: Diagnosis not present

## 2024-04-23 DIAGNOSIS — N400 Enlarged prostate without lower urinary tract symptoms: Secondary | ICD-10-CM | POA: Diagnosis not present

## 2024-04-23 LAB — POCT URINALYSIS DIPSTICK
Bilirubin, UA: NEGATIVE
Glucose, UA: NEGATIVE
Ketones, UA: NEGATIVE
Leukocytes, UA: NEGATIVE
Nitrite, UA: NEGATIVE
Protein, UA: NEGATIVE
Spec Grav, UA: 1.025 (ref 1.010–1.025)
Urobilinogen, UA: 0.2 U/dL
pH, UA: 6 (ref 5.0–8.0)

## 2024-04-23 MED ORDER — OXYCODONE-ACETAMINOPHEN 5-325 MG PO TABS: 1.0000 | ORAL_TABLET | ORAL | 0 refills | Status: AC | PRN

## 2024-04-23 NOTE — Assessment & Plan Note (Signed)
 Currently not managed. Patient has a history of 7.5 mm kidney stone in 2016 on CT which he was told was too large to pass. Current symptoms include intermittent pain in the left flank region with brown urine. Pain is currently 1-2/10. Denies any other symptoms. No other radiating pain or GI symptoms. Has not had recent evaluation for kidney stones. - Order POCT UA - Order CT Renal Stone Study - Prescribed Oxycodone -acetaminophen  for pain relief as needed if stone starts to descend

## 2024-04-23 NOTE — Progress Notes (Unsigned)
 Acute Office Visit  Subjective:     Patient ID: Gregory Mckay, male    DOB: Nov 30, 1945, 78 y.o.   MRN: [id number]  Chief Complaint  Patient presents with   Flank Pain    Left middle side, onset 2-3 weeks. Not constant, comes and goes and when it does hurt it is at about a 1-2/10.     Gregory Mckay a 78 yo male with a history of kidney stones who presents to the clinic acutely for concerns regarding left flank pain.  On interview, he reports that he has been experiencing left flank pain for the past two to three weeks. He states that the pain comes and goes and is not associated with a trigger. Nothing seems to make the pain worse or alleviate it. He rates the pain 1-2/10, lasting for 20-30 minutes at a time. He denies any other symptoms including swelling, fever, chills, nausea, or vomiting. He denies any frank urinary symptoms, but states that his urine has been brown for the past few days. He attributed the brown urine to dehydration and has been drinking more water the last two days.  He endorses a history of kidney stones approximately 10 years ago, but states that they were too large to pass. He also reports a history of two recent colon resection surgeries. He states that he knows the pain is not related to his colon. His bowel movements have been regular and his most recent colonoscopy a few months ago was normal. He also reports knowing that the pain is not muscular since he has been able to work out without exacerbating the pain.  He reports no other concerns today.     ROS      Objective:    BP 117/72 (BP Location: Left Arm, Patient Position: Sitting, Cuff Size: Normal)   Pulse 72   Temp (!) 97.5 F (36.4 C) (Oral)   Ht 5' 11 (1.803 m)   Wt 193 lb (87.5 kg)   SpO2 97%   BMI 26.92 kg/m  {Vitals History (Optional):23777}  Physical Exam Vitals reviewed.  Constitutional:      General: He is not in acute distress.    Appearance: Normal appearance. He is not  ill-appearing or diaphoretic.  HENT:     Right Ear: Tympanic membrane, ear canal and external ear normal.     Left Ear: Tympanic membrane, ear canal and external ear normal.     Nose: Nose normal.     Mouth/Throat:     Mouth: Mucous membranes are moist.     Pharynx: Oropharynx is clear. No oropharyngeal exudate or posterior oropharyngeal erythema.  Eyes:     General: No scleral icterus.    Conjunctiva/sclera: Conjunctivae normal.     Pupils: Pupils are equal, round, and reactive to light.  Cardiovascular:     Rate and Rhythm: Normal rate and regular rhythm.     Pulses: Normal pulses.     Heart sounds: Normal heart sounds. No murmur heard.    No friction rub. No gallop.  Pulmonary:     Effort: Pulmonary effort is normal. No respiratory distress.     Breath sounds: Normal breath sounds. No wheezing.  Abdominal:     General: Bowel sounds are normal. There is no distension.     Palpations: Abdomen is soft.     Tenderness: There is no abdominal tenderness. There is no right CVA tenderness, left CVA tenderness (tenderness to deep palpation over the left flank) or guarding.  Musculoskeletal:  Cervical back: Normal range of motion.     Right lower leg: No edema.     Left lower leg: No edema.  Lymphadenopathy:     Cervical: No cervical adenopathy.  Skin:    General: Skin is warm and dry.     Capillary Refill: Capillary refill takes less than 2 seconds.  Neurological:     Mental Status: He is alert and oriented to person, place, and time.  Psychiatric:        Mood and Affect: Mood normal.     Results for orders placed or performed in visit on 04/23/24  POCT urinalysis dipstick  Result Value Ref Range   Color, UA brown    Clarity, UA cloudy    Glucose, UA Negative Negative   Bilirubin, UA neg    Ketones, UA neg    Spec Grav, UA 1.025 1.010 - 1.025   Blood, UA large 3+ (A)    pH, UA 6.0 5.0 - 8.0   Protein, UA Negative Negative   Urobilinogen, UA 0.2 0.2 or 1.0 E.U./dL    Nitrite, UA neg    Leukocytes, UA Negative Negative   Appearance     Odor          Assessment & Plan:   Problem List Items Addressed This Visit       Genitourinary   Nephrolithiasis   Currently not managed. Patient has a history of 7.5 mm kidney stone in 2016 on CT which he was told was too large to pass. Current symptoms include intermittent pain in the left flank region with brown urine. Pain is currently 1-2/10. Denies any other symptoms. No other radiating pain or GI symptoms. Has not had recent evaluation for kidney stones. - Order POCT UA - Order CT Renal Stone Study - Prescribed Oxycodone -acetaminophen  for pain relief as needed if stone starts to descend      Relevant Medications   oxyCODONE -acetaminophen  (PERCOCET/ROXICET) 5-325 MG tablet   Other Relevant Orders   POCT urinalysis dipstick (Completed)   Other Visit Diagnoses       Kidney stone on left side    -  Primary   Relevant Medications   oxyCODONE -acetaminophen  (PERCOCET/ROXICET) 5-325 MG tablet   Other Relevant Orders   POCT urinalysis dipstick (Completed)     Left upper quadrant abdominal pain       Relevant Orders   CT RENAL STONE STUDY     Left flank pain       Relevant Orders   POCT urinalysis dipstick (Completed)       Meds ordered this encounter  Medications   oxyCODONE -acetaminophen  (PERCOCET/ROXICET) 5-325 MG tablet    Sig: Take 1 tablet by mouth every 4 (four) hours as needed for up to 5 days for severe pain (pain score 7-10).    Dispense:  15 tablet    Refill:  0    No follow-ups on file.  Gregory Mckay, Medical Student

## 2024-04-24 ENCOUNTER — Ambulatory Visit: Payer: Self-pay | Admitting: Family Medicine

## 2024-04-24 ENCOUNTER — Encounter: Payer: Self-pay | Admitting: Family Medicine

## 2024-04-24 NOTE — Telephone Encounter (Signed)
 Patient advised of results and need to follow up with urology

## 2024-04-25 DIAGNOSIS — Z4889 Encounter for other specified surgical aftercare: Secondary | ICD-10-CM | POA: Diagnosis not present

## 2024-04-29 DIAGNOSIS — R310 Gross hematuria: Secondary | ICD-10-CM | POA: Diagnosis not present

## 2024-04-29 DIAGNOSIS — N401 Enlarged prostate with lower urinary tract symptoms: Secondary | ICD-10-CM | POA: Diagnosis not present

## 2024-04-29 DIAGNOSIS — M549 Dorsalgia, unspecified: Secondary | ICD-10-CM | POA: Diagnosis not present

## 2024-04-29 DIAGNOSIS — N200 Calculus of kidney: Secondary | ICD-10-CM | POA: Diagnosis not present

## 2024-05-08 DIAGNOSIS — H01004 Unspecified blepharitis left upper eyelid: Secondary | ICD-10-CM | POA: Diagnosis not present

## 2024-05-13 DIAGNOSIS — H0014 Chalazion left upper eyelid: Secondary | ICD-10-CM | POA: Diagnosis not present

## 2024-05-30 ENCOUNTER — Ambulatory Visit

## 2024-05-30 ENCOUNTER — Ambulatory Visit: Payer: Self-pay

## 2024-05-30 VITALS — BP 109/68 | HR 80 | Temp 98.0°F | Ht 71.0 in | Wt 191.0 lb

## 2024-05-30 DIAGNOSIS — R319 Hematuria, unspecified: Secondary | ICD-10-CM

## 2024-05-30 DIAGNOSIS — R1032 Left lower quadrant pain: Secondary | ICD-10-CM

## 2024-05-30 LAB — POCT URINALYSIS DIPSTICK
Bilirubin, UA: NEGATIVE
Glucose, UA: NEGATIVE
Ketones, UA: NEGATIVE
Leukocytes, UA: NEGATIVE
Nitrite, UA: NEGATIVE
Protein, UA: POSITIVE — AB
Spec Grav, UA: 1.02 (ref 1.010–1.025)
Urobilinogen, UA: 0.2 U/dL
pH, UA: 6 (ref 5.0–8.0)

## 2024-05-30 NOTE — Assessment & Plan Note (Addendum)
 Patient presents with 2 month hx of LLQ pain that intermittently worsens.  Patient with complex GI and urologic hx. Hx of colon cancer s/p colectomy in December 2023, complicated by anastomosis s/p dilation, with most recent colonoscopy in April 2025 normal per patient (cannot access outside report). Has hx of diverticulosis. Also has hx of non-obstructing kidney stones noted on recent CT from July 2025. DDX for current LLQ pain includes ureteric stone/colic, constipation, diverticulitis, MSK pain. - Will obtain repeat CT urogram - Will obtain lab work as below - Continue Miralax daily - Recommend follow up with urology, has appt already scheduled - Recommend seeing GI if workup is not revealing. Referral sent.

## 2024-05-30 NOTE — Progress Notes (Signed)
 Acute visit  Patient: Gregory Mckay   DOB: September 05, 1946   78 y.o. Male  MRN: [id number] PCP: Myrla Jon HERO, MD   Chief Complaint  Patient presents with   Acute Visit    Patient states that he has been in the office previously for abdominal pain it was on his left side but now it has moved to the lower abdominal pain.  Had a BPH in July 30th. Reported he was at the urologist that he had blood in his urine, seen the urologist they stated the kidney stone that is giving him problems it has been in the same spot 15 years. SABRASABRASABRARecession of his colon not sure what is going on.  Wanted to see about an u/s or maybe a ct scan.   Subjective     Abdominal pain - Reports LLQ pain that has been flaring intermittently for the past 2 months - Pain has not improved since seeing PCP in late July - Last CT scan in July showed 9mm non-obstructing kidney stone - Had a PAE on July 3rd for prostate to help it shrink, then will get Urolift  - Had a colonoscopy in April and reported that everything looked good. Reports not available. Repeat colonoscopy in 3 years. 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 Then had SBO in early 2024. Underwent colonoscopy for further evaluation of his anastomosis with a stricture identified. Balloon dilated at that time. Repeat colonoscopy in April 2025 was reportedly normal. Hx of diverticulosis, but has never had diverticulitis  Review of systems as noted in HPI.   Objective    BP 109/68 (BP Location: Left Arm, Patient Position: Sitting, Cuff Size: Normal)   Pulse 80   Temp 98 F (36.7 C)  (Oral)   Ht 5' 11 (1.803 m)   Wt 191 lb (86.6 kg)   SpO2 96%   BMI 26.64 kg/m  Physical Exam Constitutional:      Appearance: Normal appearance.  HENT:     Head: Normocephalic and atraumatic.     Mouth/Throat:     Mouth: Mucous membranes are moist.  Eyes:     Pupils: Pupils are equal, round, and reactive to light.  Pulmonary:     Effort: Pulmonary effort is normal.  Abdominal:     General: Bowel sounds are normal. There is distension.     Tenderness: There is abdominal tenderness in the left lower quadrant. There is no right CVA tenderness, left CVA tenderness, guarding or rebound.     Comments: Mild LLQ tenderness to palpation.  Skin:    General: Skin is warm.  Neurological:     General: No focal deficit present.     Mental Status: He is alert.       Results for orders placed or performed in visit on 05/30/24  POCT Urinalysis Dipstick  Result Value Ref Range   Color, UA Dark Yellow    Clarity, UA Cloudy    Glucose, UA Negative Negative   Bilirubin, UA Negative    Ketones, UA Negative    Spec Grav, UA 1.020 1.010 - 1.025   Blood,  UA Large    pH, UA 6.0 5.0 - 8.0   Protein, UA Positive (A) Negative   Urobilinogen, UA 0.2 0.2 or 1.0 E.U./dL   Nitrite, UA Negative    Leukocytes, UA Negative Negative   Appearance     Odor      Assessment & Plan     Problem List Items Addressed This Visit       Other   Hematuria   POC urine today with large blood, previously seen on last urine. No evidence of infection. Likely 2/2 nephrolithiasis vs. BPH. Already seeing urology. Will obtain CT urogram to r/o obstructing stone.      Left lower quadrant abdominal pain - Primary   Patient presents with 2 month hx of LLQ pain that intermittently worsens.  Patient with complex GI and urologic hx. Hx of colon cancer s/p colectomy in December 2023, complicated by anastomosis s/p dilation, with most recent colonoscopy in April 2025 normal per patient (cannot access outside report). Has  hx of diverticulosis. Also has hx of non-obstructing kidney stones noted on recent CT from July 2025. DDX for current LLQ pain includes ureteric stone/colic, constipation, diverticulitis, MSK pain. - Will obtain repeat CT urogram - Will obtain lab work as below - Continue Miralax daily - Recommend follow up with urology, has appt already scheduled - Recommend seeing GI if workup is not revealing. Referral sent.      Relevant Orders   POCT Urinalysis Dipstick (Completed)   CBC   Lipase   Comprehensive metabolic panel with GFR   CT RENAL STONE STUDY   Ambulatory referral to Gastroenterology   Extensive chart review performed including 3+ external records from urology, surgery, and GI.  No orders of the defined types were placed in this encounter.    No follow-ups on file.      Isaiah DELENA Pepper, MD  [facility] [phone] (phone) [phone] (fax)

## 2024-05-30 NOTE — Telephone Encounter (Signed)
 FYI Only or Action Required?: FYI only for provider.  Patient was last seen in primary care on 04/23/2024 by Myrla Jon HERO, MD.  Called Nurse Triage reporting Abdominal Pain.  Symptoms began several months ago.  Interventions attempted: Rest, hydration, or home remedies.  Symptoms are: gradually worsening.  Triage Disposition: See HCP Within 4 Hours (Or PCP Triage)  Patient/caregiver understands and will follow disposition?: Yes Reason for Disposition  [1] MILD-MODERATE pain AND [2] constant AND [3] age > 60 years  Answer Assessment - Initial Assessment Questions Patient reports no improvement in LLQ abdominal pain. Was seen for same July and had a CT. Also reports urology r/o kidney stone. Rates pain at a 5-6/10 now. Scheduled for same day visit in PCP office.  1. LOCATION: Where does it hurt?      LLQ  3. ONSET: When did the pain begin? (Minutes, hours or days ago)      July 2025  4. SUDDEN: Gradual or sudden onset?      5. PATTERN Does the pain come and go, or is it constant?     Intermittent, becoming more severe  6. SEVERITY: How bad is the pain?  (e.g., Scale 1-10; mild, moderate, or severe)     5-6/10  Protocols used: Abdominal Pain - Male-A-AH Copied from CRM #[id number]. Topic: Clinical - Red Word Triage >> May 30, 2024  1:06 PM Amy B wrote: Red Word that prompted transfer to Nurse Triage: Pain left lower quadrant getting worse

## 2024-05-30 NOTE — Assessment & Plan Note (Signed)
 POC urine today with large blood, previously seen on last urine. No evidence of infection. Likely 2/2 nephrolithiasis vs. BPH. Already seeing urology. Will obtain CT urogram to r/o obstructing stone.

## 2024-05-31 ENCOUNTER — Ambulatory Visit: Payer: Self-pay

## 2024-05-31 LAB — COMPREHENSIVE METABOLIC PANEL WITH GFR
ALT: 40 IU/L (ref 0–44)
AST: 24 IU/L (ref 0–40)
Albumin: 4 g/dL (ref 3.8–4.8)
Alkaline Phosphatase: 71 IU/L (ref 44–121)
BUN/Creatinine Ratio: 16 (ref 10–24)
BUN: 21 mg/dL (ref 8–27)
Bilirubin Total: 0.4 mg/dL (ref 0.0–1.2)
CO2: 22 mmol/L (ref 20–29)
Calcium: 9.4 mg/dL (ref 8.6–10.2)
Chloride: 104 mmol/L (ref 96–106)
Creatinine, Ser: 1.34 mg/dL — ABNORMAL HIGH (ref 0.76–1.27)
Globulin, Total: 2.2 g/dL (ref 1.5–4.5)
Glucose: 100 mg/dL — ABNORMAL HIGH (ref 70–99)
Potassium: 4.4 mmol/L (ref 3.5–5.2)
Sodium: 140 mmol/L (ref 134–144)
Total Protein: 6.2 g/dL (ref 6.0–8.5)
eGFR: 55 mL/min/1.73 — ABNORMAL LOW (ref >59)

## 2024-05-31 LAB — CBC
Hematocrit: 48.7 % (ref 37.5–51.0)
Hemoglobin: 15.9 g/dL (ref 13.0–17.7)
MCH: 29.8 pg (ref 26.6–33.0)
MCHC: 32.6 g/dL (ref 31.5–35.7)
MCV: 91 fL (ref 79–97)
Platelets: 163 x10E3/uL (ref 150–450)
RBC: 5.34 x10E6/uL (ref 4.14–5.80)
RDW: 13.9 % (ref 11.6–15.4)
WBC: 4.7 x10E3/uL (ref 3.4–10.8)

## 2024-05-31 LAB — LIPASE: Lipase: 22 U/L (ref 13–78)

## 2024-06-04 DIAGNOSIS — E66812 Obesity, class 2: Secondary | ICD-10-CM | POA: Diagnosis not present

## 2024-06-04 DIAGNOSIS — R7303 Prediabetes: Secondary | ICD-10-CM | POA: Diagnosis not present

## 2024-06-04 DIAGNOSIS — K523 Indeterminate colitis: Secondary | ICD-10-CM | POA: Diagnosis not present

## 2024-06-04 DIAGNOSIS — E782 Mixed hyperlipidemia: Secondary | ICD-10-CM | POA: Diagnosis not present

## 2024-06-04 DIAGNOSIS — N402 Nodular prostate without lower urinary tract symptoms: Secondary | ICD-10-CM | POA: Diagnosis not present

## 2024-06-04 DIAGNOSIS — E039 Hypothyroidism, unspecified: Secondary | ICD-10-CM | POA: Diagnosis not present

## 2024-06-04 DIAGNOSIS — G9331 Postviral fatigue syndrome: Secondary | ICD-10-CM | POA: Diagnosis not present

## 2024-06-04 DIAGNOSIS — E279 Disorder of adrenal gland, unspecified: Secondary | ICD-10-CM | POA: Diagnosis not present

## 2024-06-14 DIAGNOSIS — R1032 Left lower quadrant pain: Secondary | ICD-10-CM | POA: Diagnosis not present

## 2024-06-20 DIAGNOSIS — R350 Frequency of micturition: Secondary | ICD-10-CM | POA: Diagnosis not present

## 2024-06-20 DIAGNOSIS — N401 Enlarged prostate with lower urinary tract symptoms: Secondary | ICD-10-CM | POA: Diagnosis not present

## 2024-06-20 DIAGNOSIS — I771 Stricture of artery: Secondary | ICD-10-CM | POA: Diagnosis not present

## 2024-06-20 DIAGNOSIS — R3915 Urgency of urination: Secondary | ICD-10-CM | POA: Diagnosis not present

## 2024-06-24 ENCOUNTER — Encounter: Payer: Self-pay | Admitting: Family Medicine

## 2024-06-24 ENCOUNTER — Ambulatory Visit
Admission: RE | Admit: 2024-06-24 | Discharge: 2024-06-24 | Disposition: A | Discharge status: Home / Self Care | Source: Ambulatory Visit | Attending: Family Medicine | Admitting: Family Medicine

## 2024-06-24 ENCOUNTER — Ambulatory Visit: Payer: Self-pay | Admitting: Family Medicine

## 2024-06-24 ENCOUNTER — Ambulatory Visit (INDEPENDENT_AMBULATORY_CARE_PROVIDER_SITE_OTHER): Admitting: Family Medicine

## 2024-06-24 VITALS — BP 80/45 | HR 79 | Temp 98.8°F | Ht 71.0 in | Wt 190.0 lb

## 2024-06-24 DIAGNOSIS — N12 Tubulo-interstitial nephritis, not specified as acute or chronic: Secondary | ICD-10-CM | POA: Diagnosis not present

## 2024-06-24 DIAGNOSIS — R1012 Left upper quadrant pain: Secondary | ICD-10-CM | POA: Insufficient documentation

## 2024-06-24 DIAGNOSIS — R509 Fever, unspecified: Secondary | ICD-10-CM

## 2024-06-24 DIAGNOSIS — K8020 Calculus of gallbladder without cholecystitis without obstruction: Secondary | ICD-10-CM | POA: Diagnosis not present

## 2024-06-24 DIAGNOSIS — R188 Other ascites: Secondary | ICD-10-CM | POA: Diagnosis not present

## 2024-06-24 DIAGNOSIS — N200 Calculus of kidney: Secondary | ICD-10-CM | POA: Diagnosis not present

## 2024-06-24 DIAGNOSIS — N1831 Chronic kidney disease, stage 3a: Secondary | ICD-10-CM | POA: Diagnosis not present

## 2024-06-24 DIAGNOSIS — R319 Hematuria, unspecified: Secondary | ICD-10-CM | POA: Diagnosis not present

## 2024-06-24 DIAGNOSIS — K5730 Diverticulosis of large intestine without perforation or abscess without bleeding: Secondary | ICD-10-CM | POA: Diagnosis not present

## 2024-06-24 MED ORDER — LIDOCAINE HCL 1 % IJ SOLN
10.0000 mL | Freq: Once | INTRAMUSCULAR | Status: AC
  Administered 2024-06-24: 1.5 mL via INTRADERMAL

## 2024-06-24 MED ORDER — CEFTRIAXONE SODIUM 500 MG IJ SOLR
500.0000 mg | Freq: Once | INTRAMUSCULAR | Status: AC
  Administered 2024-06-24: 500 mg via INTRAMUSCULAR

## 2024-06-24 MED ORDER — CEFPODOXIME PROXETIL 200 MG PO TABS: 200.0000 mg | ORAL_TABLET | Freq: Two times a day (BID) | ORAL | 0 refills | Status: AC

## 2024-06-24 NOTE — Progress Notes (Signed)
 Acute visit   Patient: Gregory Mckay   DOB: 1945-11-25   78 y.o. Male  MRN: [id number] PCP: Myrla Jon HERO, MD   Chief Complaint  Patient presents with   Follow-up    Patient is present due to blood in urine He was seen in office on 05/30/24 due to LLQ abd pain and POCT showed L hematuria. Pt already associated with urology. Order placed for CT Renal Study and referral to GI placed. Pt reports he saw urology last week. Hematuria has worsened, pain is about the same, fever began Friday with it ranging anywhere from 99 -100.4 temporal rotating between tylenol  and ibuprofen with it last taken a few hours ago. Wife reports when patient urinates it is clots present   Subjective    Discussed the use of AI scribe software for clinical note transcription with the patient, who gave verbal consent to proceed.  History of Present Illness   Gregory Mckay is a 78 year old male with a history of kidney stones who presents with fever, hematuria, and left flank pain.  Following a recent cystoscopy, he developed a fever ranging from 51F to 102.26F, left flank pain, and chills. The flank pain resolved by Saturday morning, but the fever persists. He experiences significant hematuria with urine described as 'practically brown' and visible blood clots, which are mostly blood without grit.  He alternates Tylenol  and Advil to manage the fever, though he prefers to avoid Advil due to concerns about kidney health. He has a longstanding kidney stone in a 'sack area' that is not causing obstruction. No recent imaging of the kidneys or ureters has been performed. No known allergies. He has been managing symptoms at home over the weekend.        Review of Systems  Objective    BP (!) 80/45 (BP Location: Left Arm, Patient Position: Sitting, Cuff Size: Normal)   Pulse 79   Temp 98.8 F (37.1 C) (Oral) Comment: Advil taken 2 hours ago  Ht 5' 11 (1.803 m)   Wt 190 lb (86.2 kg)   SpO2 98%   BMI  26.50 kg/m   Physical Exam Vitals reviewed.  Constitutional:      General: He is not in acute distress.    Appearance: Normal appearance. He is not diaphoretic.  HENT:     Head: Normocephalic and atraumatic.  Eyes:     General: No scleral icterus.    Conjunctiva/sclera: Conjunctivae normal.  Cardiovascular:     Rate and Rhythm: Normal rate and regular rhythm.     Heart sounds: Normal heart sounds. No murmur heard. Pulmonary:     Effort: Pulmonary effort is normal. No respiratory distress.     Breath sounds: Normal breath sounds. No wheezing or rhonchi.  Abdominal:     General: There is no distension.     Palpations: Abdomen is soft.     Tenderness: There is no abdominal tenderness. There is no right CVA tenderness or left CVA tenderness.  Musculoskeletal:     Cervical back: Neck supple.     Right lower leg: No edema.     Left lower leg: No edema.  Lymphadenopathy:     Cervical: No cervical adenopathy.  Skin:    General: Skin is warm and dry.     Findings: No rash.  Neurological:     Mental Status: He is alert and oriented to person, place, and time. Mental status is at baseline.  Psychiatric:  Mood and Affect: Mood normal.        Behavior: Behavior normal.      UA dipstick - will not result due to concentration of blood No results found for any visits on 06/24/24.  Assessment & Plan     Problem List Items Addressed This Visit       Genitourinary   Stage 3a chronic kidney disease (HCC)   Nephrolithiasis     Other   Hematuria - Primary   Relevant Orders   POCT urinalysis dipstick   Comprehensive metabolic panel with GFR   CBC w/Diff/Platelet   Urine Culture   Other Visit Diagnoses       Fever, unspecified fever cause         Pyelonephritis       Relevant Medications   cefpodoxime (VANTIN) 200 MG tablet   Other Relevant Orders   Comprehensive metabolic panel with GFR   CBC w/Diff/Platelet   Urine Culture     Left upper quadrant abdominal pain        Relevant Orders   CT RENAL STONE STUDY           Hematuria and urinary tract infection, possible obstructing nephrolithiasis Acute hematuria with fever, chills, and left flank pain following recent cystoscopy. Urine appears brown with clots. Differential includes obstructing nephrolithiasis causing infection. Low blood pressure and fever suggest possible sepsis. Previous imaging showed a non-obstructing kidney stone. Concern for stone in ureter causing obstruction and infection. - Order stat CT scan of kidneys and ureters without contrast to assess for obstructing nephrolithiasis. - Administer 1 gram IM ceftriaxone to initiate antibiotic treatment. - Prescribe oral Cefpodoxime 200 mg twice daily for 10 days. - Send urine culture to identify causative organism and sensitivities. - Order blood work to assess kidney function and white blood cell count in the setting of fever and hypotension. - Advise hydration and use of Tylenol  for fever management, avoiding Advil if possible due to bleeding risk. - Instruct to go to ER if symptoms worsen before follow-up. - Schedule follow-up appointment for Thursday to reassess condition.  Chronic nephrolithiasis Chronic nephrolithiasis with a non-obstructing stone in the kidney. Previous cystoscopy showed no bladder stones. Potential for new stone formation causing current symptoms. - Monitor for new stone formation and potential obstruction.        Meds ordered this encounter  Medications   cefpodoxime (VANTIN) 200 MG tablet    Sig: Take 1 tablet (200 mg total) by mouth 2 (two) times daily for 10 days.    Dispense:  20 tablet    Refill:  0     Return in about 3 days (around 06/27/2024) for pyelonephritis f/u (needs to be this week).      Jon Eva, MD  [facility] [phone] (phone) [phone] (fax)  [facility]

## 2024-06-24 NOTE — Addendum Note (Signed)
 Addended by: LILIAN SEVERO RAMAN on: 06/24/2024 04:35 PM   Modules accepted: Orders

## 2024-06-25 ENCOUNTER — Emergency Department
Admission: EM | Admit: 2024-06-25 | Discharge: 2024-06-25 | Disposition: A | Discharge status: Home / Self Care | Attending: Emergency Medicine | Admitting: Emergency Medicine

## 2024-06-25 ENCOUNTER — Other Ambulatory Visit: Payer: Self-pay

## 2024-06-25 ENCOUNTER — Encounter: Payer: Self-pay | Admitting: Emergency Medicine

## 2024-06-25 DIAGNOSIS — N179 Acute kidney failure, unspecified: Secondary | ICD-10-CM | POA: Insufficient documentation

## 2024-06-25 DIAGNOSIS — N390 Urinary tract infection, site not specified: Secondary | ICD-10-CM | POA: Insufficient documentation

## 2024-06-25 DIAGNOSIS — R109 Unspecified abdominal pain: Secondary | ICD-10-CM | POA: Diagnosis not present

## 2024-06-25 LAB — COMPREHENSIVE METABOLIC PANEL WITH GFR
ALT: 22 IU/L (ref 0–44)
AST: 22 IU/L (ref 0–40)
Albumin: 3.9 g/dL (ref 3.8–4.8)
Alkaline Phosphatase: 96 IU/L (ref 47–123)
BUN/Creatinine Ratio: 15 (ref 10–24)
BUN: 35 mg/dL — ABNORMAL HIGH (ref 8–27)
Bilirubin Total: 1 mg/dL (ref 0.0–1.2)
CO2: 20 mmol/L (ref 20–29)
Calcium: 9.9 mg/dL (ref 8.6–10.2)
Chloride: 97 mmol/L (ref 96–106)
Creatinine, Ser: 2.27 mg/dL — ABNORMAL HIGH (ref 0.76–1.27)
Globulin, Total: 2.4 g/dL (ref 1.5–4.5)
Glucose: 117 mg/dL — ABNORMAL HIGH (ref 70–99)
Potassium: 4.3 mmol/L (ref 3.5–5.2)
Sodium: 136 mmol/L (ref 134–144)
Total Protein: 6.3 g/dL (ref 6.0–8.5)
eGFR: 29 mL/min/1.73 — ABNORMAL LOW (ref >59)

## 2024-06-25 LAB — BASIC METABOLIC PANEL WITH GFR
Anion gap: 10 (ref 5–15)
BUN: 53 mg/dL — ABNORMAL HIGH (ref 8–23)
CO2: 21 mmol/L — ABNORMAL LOW (ref 22–32)
Calcium: 9 mg/dL (ref 8.9–10.3)
Chloride: 100 mmol/L (ref 98–111)
Creatinine, Ser: 2.54 mg/dL — ABNORMAL HIGH (ref 0.61–1.24)
GFR, Estimated: 25 mL/min — ABNORMAL LOW (ref >60)
Glucose, Bld: 136 mg/dL — ABNORMAL HIGH (ref 70–99)
Potassium: 4 mmol/L (ref 3.5–5.1)
Sodium: 131 mmol/L — ABNORMAL LOW (ref 135–145)

## 2024-06-25 LAB — CBC WITH DIFFERENTIAL/PLATELET
Basophils Absolute: 0 x10E3/uL (ref 0.0–0.2)
Basos: 0 %
EOS (ABSOLUTE): 0 x10E3/uL (ref 0.0–0.4)
Eos: 0 %
Hematocrit: 48.9 % (ref 37.5–51.0)
Hemoglobin: 15.5 g/dL (ref 13.0–17.7)
Immature Grans (Abs): 0.1 x10E3/uL (ref 0.0–0.1)
Immature Granulocytes: 1 %
Lymphocytes Absolute: 0.4 x10E3/uL — ABNORMAL LOW (ref 0.7–3.1)
Lymphs: 4 %
MCH: 29.1 pg (ref 26.6–33.0)
MCHC: 31.7 g/dL (ref 31.5–35.7)
MCV: 92 fL (ref 79–97)
Monocytes Absolute: 1 x10E3/uL — ABNORMAL HIGH (ref 0.1–0.9)
Monocytes: 9 %
Neutrophils Absolute: 9.2 x10E3/uL — ABNORMAL HIGH (ref 1.4–7.0)
Neutrophils: 86 %
Platelets: 119 x10E3/uL — ABNORMAL LOW (ref 150–450)
RBC: 5.33 x10E6/uL (ref 4.14–5.80)
RDW: 13 % (ref 11.6–15.4)
WBC: 10.7 x10E3/uL (ref 3.4–10.8)

## 2024-06-25 LAB — URINALYSIS, ROUTINE W REFLEX MICROSCOPIC
Bilirubin Urine: NEGATIVE
Glucose, UA: NEGATIVE mg/dL
Ketones, ur: NEGATIVE mg/dL
Nitrite: NEGATIVE
Protein, ur: 100 mg/dL — AB
RBC / HPF: >50 RBC/hpf (ref 0–5)
Specific Gravity, Urine: 1.019 (ref 1.005–1.030)
WBC, UA: >50 WBC/hpf (ref 0–5)
pH: 5 (ref 5.0–8.0)

## 2024-06-25 LAB — CBC
HCT: 45.8 % (ref 39.0–52.0)
Hemoglobin: 15 g/dL (ref 13.0–17.0)
MCH: 29.3 pg (ref 26.0–34.0)
MCHC: 32.8 g/dL (ref 30.0–36.0)
MCV: 89.5 fL (ref 80.0–100.0)
Platelets: 118 K/uL — ABNORMAL LOW (ref 150–400)
RBC: 5.12 MIL/uL (ref 4.22–5.81)
RDW: 13.9 % (ref 11.5–15.5)
WBC: 8.7 K/uL (ref 4.0–10.5)
nRBC: 0 % (ref 0.0–0.2)

## 2024-06-25 MED ORDER — SODIUM CHLORIDE 0.9 % IV BOLUS
1000.0000 mL | Freq: Once | INTRAVENOUS | Status: AC
  Administered 2024-06-25: 1000 mL via INTRAVENOUS

## 2024-06-25 NOTE — ED Provider Notes (Signed)
 [facility] Provider Note    Event Date/Time   First MD Initiated Contact with Patient 06/25/24 1531     (approximate)   History   Abnormal Lab   HPI  Gregory Mckay is a 78 year old male presenting to the ER for evaluation of hematuria and flank pain.  On Thursday, patient was seen by his urologist in follow-up for an enlarged prostate and bladder stone.  He describes a camera through his urethra and what sounds like a cystoscopy which demonstrated his bladder stone had resolved as well as some sort of camera posteriorly to visualize his prostate.  On Friday he had onset of moderate volume hematuria with bright red blood and small clots.  He also had onset of fevers with a Tmax of 101 as well as left flank pain.  He has been able to continue to void during this time.  He saw his primary care doctor yesterday who ordered a CT scan, provided 1 g of IM Rocephin as well as oral cefpodoxime for a 10-day course and sent a urine culture.  CT demonstrated a nonobstructing intrarenal stone, but no obstructing pathology.  Mild stranding of the left ureter noted.  Labs demonstrated significant kidney injury with a creatinine of 2.27 and baseline around 1.3.  In the setting this is recommended the patient present to the ER for further evaluation.  Patient does report that some starting antibiotics he does feel that he is improved.  He has not had any further fevers.  His urine is starting to clear.  His flank pain has resolved.      Physical Exam   Triage Vital Signs: ED Triage Vitals  Encounter Vitals Group     BP 06/25/24 1313 93/62     Girls Systolic BP Percentile --      Girls Diastolic BP Percentile --      Boys Systolic BP Percentile --      Boys Diastolic BP Percentile --      Pulse Rate 06/25/24 1313 76     Resp 06/25/24 1313 17     Temp 06/25/24 1313 97.7 F (36.5 C)     Temp Source 06/25/24 1313 Oral     SpO2 06/25/24 1313 96 %     Weight 06/25/24 1314  189 lb 9.5 oz (86 kg)     Height 06/25/24 1314 5' 11 (1.803 m)     Head Circumference --      Peak Flow --      Pain Score 06/25/24 1314 0     Pain Loc --      Pain Education --      Exclude from Growth Chart --     Most recent vital signs: Vitals:   06/25/24 1313  BP: 93/62  Pulse: 76  Resp: 17  Temp: 97.7 F (36.5 C)  SpO2: 96%     General: Awake, interactive  CV:  Regular rate, good peripheral perfusion.  Resp:  Unlabored respirations, lungs clear to auscultation Abd:  Nondistended, soft, nontender Neuro:  Symmetric facial movement, fluid speech   ED Results / Procedures / Treatments   Labs (all labs ordered are listed, but only abnormal results are displayed) Labs Reviewed  URINALYSIS, ROUTINE W REFLEX MICROSCOPIC - Abnormal; Notable for the following components:      Result Value   Color, Urine AMBER (*)    APPearance CLOUDY (*)    Hgb urine dipstick LARGE (*)    Protein, ur 100 (*)  Leukocytes,Ua SMALL (*)    Bacteria, UA RARE (*)    All other components within normal limits  BASIC METABOLIC PANEL WITH GFR - Abnormal; Notable for the following components:   Sodium 131 (*)    CO2 21 (*)    Glucose, Bld 136 (*)    BUN 53 (*)    Creatinine, Ser 2.54 (*)    GFR, Estimated 25 (*)    All other components within normal limits  CBC - Abnormal; Notable for the following components:   Platelets 118 (*)    All other components within normal limits  URINE CULTURE     EKG EKG independently reviewed and interpreted by myself demonstrates:    RADIOLOGY Imaging independently reviewed and interpreted by myself demonstrates:   Formal Radiology Read:  No results found.  PROCEDURES:  Critical Care performed: No  Procedures   MEDICATIONS ORDERED IN ED: Medications  sodium chloride  0.9 % bolus 1,000 mL (0 mLs Intravenous Stopped 06/25/24 1811)     IMPRESSION / MDM / ASSESSMENT AND PLAN / ED COURSE  I reviewed the triage vital signs and the nursing  notes.  Differential diagnosis includes, but is not limited to, UTI, pyelonephritis, hematuria secondary to recent cystoscopy, prostatic bleeding  Patient's presentation is most consistent with acute presentation with potential threat to life or bodily function.  78 year old male presenting to the emergency department for evaluation of hematuria with recent fevers, urinary instrumentation.  I reviewed his CT scan performed yesterday and I do not see any obvious obstructive pathology.  His lab work here demonstrates stable hemoglobin at 15, his BMP does demonstrate persistent worsening renal function with a creatinine of 2.54.  His urinalysis is somewhat difficult to interpret given degree of hematuria but remains with significant white blood cells and rare bacteria.  Will discuss his case with on-call urology.  Clinical Course as of 06/25/24 1820  Tue Jun 25, 2024  1717 Case reviewed with Dr. Twylla.  He reviewed the patient's labs and imaging.  He does note that patient has a significantly enlarged prostate which may be contributing to his hematuria in addition to his recent procedure.  Does think it is possible that patient developed a UTI post cystoscopy causing his fevers and symptoms since Friday.  He does note some mild stranding along the kidney, possible component of pyelonephritis.  However, he notes that the patient is clinically improving and was comfortable with discharge home it would be reasonable to continue home and outpatient antibiotics.  Patient was reassessed and updated on conversation with urology.  He reports that he does feel that he is significantly improving on antibiotics.  He is agreeable to IV fluids here for further treatment of his AKI, but after that would prefer to be discharged home which I do think is reasonable.  I sent a urine culture off of his urinalysis. [NR]  1818 Patient reassessed after fluids.  Continues to feel improved.  He is comfortable with discharge home,  continuing antibiotics, close outpatient follow-up.  Strict return precautions were provided.  He was discharged in stable condition. [NR]    Clinical Course User Index [NR] Levander Slate, MD     FINAL CLINICAL IMPRESSION(S) / ED DIAGNOSES   Final diagnoses:  Urinary tract infection with hematuria, site unspecified  Acute kidney injury     Rx / DC Orders   ED Discharge Orders     None        Note:  This document was prepared using Dragon  voice recognition software and may include unintentional dictation errors.   Levander Slate, MD 06/25/24 [phone]

## 2024-06-25 NOTE — ED Notes (Signed)
Iv fluids infusing.  

## 2024-06-25 NOTE — Discharge Instructions (Signed)
 You were seen in the emergency department today for evaluation of your hematuria.  Your labs today did show that your kidney functioning continues to be worsened from your baseline.  We reviewed your case with our urologist.  I suspect you may have developed a urinary tract infection after your recent urologic procedure, and there is a chance that this may be affecting your kidney.  Please continue to take the antibiotics you were prescribed as directed.  If you had new or worsening symptoms including recurrent fevers, worsening abdominal pain, recurrent vomiting, or unable to void, or any other new or concerning symptoms, please return to the ER for further evaluation.  Otherwise, please follow-up closely with your primary care doctor and urologist for further evaluation as well as monitoring of your kidney function.

## 2024-06-25 NOTE — ED Triage Notes (Signed)
 Patient to ED via POV for abnormal labs- AKI. Seen by urology for blood in urine- ongoing for months. BUN 35, Cr 2.27

## 2024-06-26 LAB — URINE CULTURE: Culture: NO GROWTH

## 2024-06-27 ENCOUNTER — Ambulatory Visit: Admitting: Family Medicine

## 2024-06-27 LAB — URINE CULTURE

## 2024-07-03 ENCOUNTER — Ambulatory Visit: Payer: Self-pay

## 2024-07-03 DIAGNOSIS — N12 Tubulo-interstitial nephritis, not specified as acute or chronic: Secondary | ICD-10-CM

## 2024-07-03 DIAGNOSIS — N179 Acute kidney failure, unspecified: Secondary | ICD-10-CM

## 2024-07-03 NOTE — Telephone Encounter (Signed)
 FYI Only or Action Required?: Action required by provider: clinical question for provider. Pt would like to know if he needs another ABX to completely clear his infection or need know if he needs additional testing.  Please call pt.  Patient was last seen in primary care on 06/24/2024 by Myrla Jon HERO, MD.  Called Nurse Triage reporting Pain.  Symptoms began 06/24/2024.  Interventions attempted: Prescription medications: ABX.  Symptoms are: gradually improving.  Triage Disposition: Home Care  Patient/caregiver understands and will follow disposition?: Yes   Reason for Disposition  Caused by overuse from recent vigorous activity (e.g., exercise, gardening, lifting and carrying, sports)    Pt stated previously dx with kidney infection and completed ABX today but still having pain and discomfort.  Answer Assessment - Initial Assessment Questions 1. ONSET: When did the pain begin? (e.g., minutes, hours, days)     06/24/2024 and continuing 2. LOCATION: Where does it hurt? (upper, mid or lower back)     Left abd pain 3. SEVERITY: How bad is the pain?  (e.g., Scale 1-10; mild, moderate, or severe)     3/10 to 4/10 4. PATTERN: Is the pain constant? (e.g., yes, no; constant, intermittent)      Comes and goes 5. RADIATION: Does the pain shoot into your legs or somewhere else?     no 6. CAUSE:  What do you think is causing the back pain?      Kidney infection  7. BACK OVERUSE:  Any recent lifting of heavy objects, strenuous work or exercise?    Kidney infection 8. MEDICINES: What have you taken so far for the pain? (e.g., nothing, acetaminophen , NSAIDS)     ABX - last day of taking ABX 9. NEUROLOGIC SYMPTOMS: Do you have any weakness, numbness, or problems with bowel/bladder control?     no 10. OTHER SYMPTOMS: Do you have any other symptoms? (e.g., fever, abdomen pain, burning with urination, blood in urine)       no 11. PREGNANCY: Is there any chance you are  pregnant? When was your last menstrual period?       no  Had fever and blood in urine last week but none now.  Pt is still having pain and wants to know if you need another ABX or testign  Protocols used: Back Pain-A-AH

## 2024-07-04 ENCOUNTER — Ambulatory Visit

## 2024-07-04 NOTE — Telephone Encounter (Signed)
 Order [facility] UA with reflex and he can come give a sample with the lab.

## 2024-07-04 NOTE — Telephone Encounter (Signed)
 Orders placed. Patient advised. Verbalized understanding

## 2024-07-05 ENCOUNTER — Other Ambulatory Visit: Payer: Self-pay | Admitting: Family Medicine

## 2024-07-05 DIAGNOSIS — N12 Tubulo-interstitial nephritis, not specified as acute or chronic: Secondary | ICD-10-CM | POA: Diagnosis not present

## 2024-07-06 LAB — URINALYSIS, ROUTINE W REFLEX MICROSCOPIC
Bilirubin, UA: NEGATIVE
Glucose, UA: NEGATIVE
Ketones, UA: NEGATIVE
Leukocytes,UA: NEGATIVE
Nitrite, UA: NEGATIVE
Specific Gravity, UA: 1.014 (ref 1.005–1.030)
Urobilinogen, Ur: 0.2 mg/dL (ref 0.2–1.0)
pH, UA: 5.5 (ref 5.0–7.5)

## 2024-07-06 LAB — BASIC METABOLIC PANEL WITH GFR
BUN/Creatinine Ratio: 14 (ref 10–24)
BUN: 18 mg/dL (ref 8–27)
CO2: 19 mmol/L — ABNORMAL LOW (ref 20–29)
Calcium: 9.3 mg/dL (ref 8.6–10.2)
Chloride: 107 mmol/L — ABNORMAL HIGH (ref 96–106)
Creatinine, Ser: 1.26 mg/dL (ref 0.76–1.27)
Glucose: 123 mg/dL — ABNORMAL HIGH (ref 70–99)
Potassium: 4.5 mmol/L (ref 3.5–5.2)
Sodium: 143 mmol/L (ref 134–144)
eGFR: 59 mL/min/1.73 — ABNORMAL LOW (ref >59)

## 2024-07-06 LAB — MICROSCOPIC EXAMINATION
Bacteria, UA: NONE SEEN
Casts: NONE SEEN /LPF
Epithelial Cells (non renal): NONE SEEN /HPF (ref 0–10)
RBC, Urine: >30 /HPF — AB (ref 0–2)

## 2024-07-07 LAB — URINE CULTURE: Organism ID, Bacteria: NO GROWTH

## 2024-07-08 ENCOUNTER — Ambulatory Visit: Payer: Self-pay | Admitting: Family Medicine

## 2024-07-22 ENCOUNTER — Ambulatory Visit: Payer: Self-pay

## 2024-07-22 DIAGNOSIS — R319 Hematuria, unspecified: Secondary | ICD-10-CM

## 2024-07-22 DIAGNOSIS — R10A1 Flank pain, right side: Secondary | ICD-10-CM

## 2024-07-22 NOTE — Telephone Encounter (Signed)
 Ok to send urology referral to Dr Francisca - use dx hematuria and R flank pain

## 2024-07-22 NOTE — Telephone Encounter (Signed)
 FYI Only or Action Required?: Action required by provider: referral request. Pt would like to see Dr. Meg in [facility]  Patient was last seen in primary care on 06/24/2024 by Myrla Jon HERO, MD.  Called Nurse Triage reporting Abdominal Pain - blood in urine  Symptoms began several months ago.  Interventions attempted: Other: See at PCP.  Symptoms are: unchanged.  Triage Disposition: See PCP When Office is Open (Within 3 Days)  Patient/caregiver understands and will follow disposition?: no - pt wants to have referral to Dr. Meg - also requesting a call back when referral is placed.                       Copied from CRM #[id number]. Topic: Clinical - Red Word Triage >> Jul 22, 2024 12:32 PM Gregory Mckay wrote: Red Word that prompted transfer to Nurse Triage: pain on side, asking for a referral Reason for Disposition  [1] MODERATE back pain (e.g., interferes with normal activities) AND [2] present > 3 days  Answer Assessment - Initial Assessment Questions 1. ONSET: When did the pain begin? (e.g., minutes, hours, days)     Before 9/29 2. LOCATION: Where does it hurt? (upper, mid or lower back)     Right side near back pain 3. SEVERITY: How bad is the pain?  (e.g., Scale 1-10; mild, moderate, or severe)     Comes and goes - 3-4/10 4. PATTERN: Is the pain constant? (e.g., yes, no; constant, intermittent)      yes 5. RADIATION: Does the pain shoot into your legs or somewhere else?     no 6. CAUSE:  What do you think is causing the back pain?      Prostrate problem? Unsure kidney stone 7. BACK OVERUSE:  Any recent lifting of heavy objects, strenuous work or exercise?     no 8. MEDICINES: What have you taken so far for the pain? (e.g., nothing, acetaminophen , NSAIDS)     Antibiotics -  9. NEUROLOGIC SYMPTOMS: Do you have any weakness, numbness, or problems with bowel/bladder control?     no 10. OTHER SYMPTOMS: Do you have any other  symptoms? (e.g., fever, abdomen pain, burning with urination, blood in urine)       Blood in urine, fatigue  Protocols used: Back Pain-A-AH

## 2024-07-22 NOTE — Addendum Note (Signed)
 Addended by: LILIAN SEVERO RAMAN on: 07/22/2024 04:40 PM   Modules accepted: Orders

## 2024-07-22 NOTE — Telephone Encounter (Signed)
 Pt advised. Verbalized understanding.

## 2024-07-25 ENCOUNTER — Ambulatory Visit (INDEPENDENT_AMBULATORY_CARE_PROVIDER_SITE_OTHER)

## 2024-07-25 DIAGNOSIS — Z23 Encounter for immunization: Secondary | ICD-10-CM

## 2024-07-29 ENCOUNTER — Other Ambulatory Visit (HOSPITAL_COMMUNITY): Payer: Self-pay

## 2024-07-29 ENCOUNTER — Telehealth: Payer: Self-pay

## 2024-07-29 NOTE — Telephone Encounter (Signed)
 Pharmacy Patient Advocate Encounter   Received notification from Onbase that prior authorization for Testosterone  1.62% gel is required/requested.   Insurance verification completed.   The patient is insured through [facility].   Per test claim: PA required; PA submitted to above mentioned insurance via Latent Key/confirmation #/EOC [facility] Status is pending

## 2024-07-30 ENCOUNTER — Telehealth: Payer: Self-pay

## 2024-07-30 ENCOUNTER — Other Ambulatory Visit (HOSPITAL_COMMUNITY): Payer: Self-pay

## 2024-07-30 DIAGNOSIS — R310 Gross hematuria: Secondary | ICD-10-CM | POA: Diagnosis not present

## 2024-07-30 DIAGNOSIS — N401 Enlarged prostate with lower urinary tract symptoms: Secondary | ICD-10-CM | POA: Diagnosis not present

## 2024-07-30 NOTE — Telephone Encounter (Signed)
 Noted

## 2024-07-30 NOTE — Telephone Encounter (Signed)
 Pharmacy Patient Advocate Encounter  Received notification from [employer] that Prior Authorization for Testosterone  1.62% gel  has been APPROVED from 07/29/24 to 07/29/25. Ran test claim, Copay is $0. This test claim was processed through [facility]- copay amounts may vary at other pharmacies due to pharmacy/plan contracts, or as the patient moves through the different stages of their insurance plan.   PA #/Case ID/Reference #: [id number]

## 2024-07-30 NOTE — Telephone Encounter (Unsigned)
 Copied from CRM #[id number]. Topic: Referral - Prior Authorization Question >> Jul 30, 2024  8:35 AM Roselie BROCKS wrote: Reason for RMF:Lmdjoj from  [facility] called with prior authorization approval on Testosterone  1.62 % GEL , the medication is approved from  07-29-24 to 07-29-25 member notified as well.

## 2024-08-08 DIAGNOSIS — E785 Hyperlipidemia, unspecified: Secondary | ICD-10-CM | POA: Diagnosis not present

## 2024-08-08 DIAGNOSIS — I4510 Unspecified right bundle-branch block: Secondary | ICD-10-CM | POA: Diagnosis not present

## 2024-08-08 DIAGNOSIS — I480 Paroxysmal atrial fibrillation: Secondary | ICD-10-CM | POA: Diagnosis not present

## 2024-08-08 DIAGNOSIS — I10 Essential (primary) hypertension: Secondary | ICD-10-CM | POA: Diagnosis not present

## 2024-08-13 DIAGNOSIS — K5730 Diverticulosis of large intestine without perforation or abscess without bleeding: Secondary | ICD-10-CM | POA: Diagnosis not present

## 2024-08-13 DIAGNOSIS — N200 Calculus of kidney: Secondary | ICD-10-CM | POA: Diagnosis not present

## 2024-08-13 DIAGNOSIS — N400 Enlarged prostate without lower urinary tract symptoms: Secondary | ICD-10-CM | POA: Diagnosis not present

## 2024-08-13 DIAGNOSIS — K8020 Calculus of gallbladder without cholecystitis without obstruction: Secondary | ICD-10-CM | POA: Diagnosis not present

## 2024-08-13 DIAGNOSIS — R310 Gross hematuria: Secondary | ICD-10-CM | POA: Diagnosis not present

## 2024-08-15 ENCOUNTER — Ambulatory Visit: Admitting: Family Medicine

## 2024-08-27 ENCOUNTER — Other Ambulatory Visit: Payer: Self-pay | Admitting: Family Medicine

## 2024-08-27 DIAGNOSIS — N401 Enlarged prostate with lower urinary tract symptoms: Secondary | ICD-10-CM | POA: Diagnosis not present

## 2024-08-27 DIAGNOSIS — R310 Gross hematuria: Secondary | ICD-10-CM | POA: Diagnosis not present

## 2024-09-04 ENCOUNTER — Ambulatory Visit

## 2024-09-04 DIAGNOSIS — L821 Other seborrheic keratosis: Secondary | ICD-10-CM

## 2024-09-04 DIAGNOSIS — L570 Actinic keratosis: Secondary | ICD-10-CM

## 2024-09-04 DIAGNOSIS — L814 Other melanin hyperpigmentation: Secondary | ICD-10-CM | POA: Diagnosis not present

## 2024-09-04 DIAGNOSIS — W908XXA Exposure to other nonionizing radiation, initial encounter: Secondary | ICD-10-CM | POA: Diagnosis not present

## 2024-09-04 DIAGNOSIS — L720 Epidermal cyst: Secondary | ICD-10-CM

## 2024-09-04 DIAGNOSIS — D1801 Hemangioma of skin and subcutaneous tissue: Secondary | ICD-10-CM

## 2024-09-04 DIAGNOSIS — C4491 Basal cell carcinoma of skin, unspecified: Secondary | ICD-10-CM

## 2024-09-04 DIAGNOSIS — D489 Neoplasm of uncertain behavior, unspecified: Secondary | ICD-10-CM

## 2024-09-04 DIAGNOSIS — L578 Other skin changes due to chronic exposure to nonionizing radiation: Secondary | ICD-10-CM | POA: Diagnosis not present

## 2024-09-04 DIAGNOSIS — L738 Other specified follicular disorders: Secondary | ICD-10-CM | POA: Diagnosis not present

## 2024-09-04 DIAGNOSIS — L723 Sebaceous cyst: Secondary | ICD-10-CM

## 2024-09-04 DIAGNOSIS — C44519 Basal cell carcinoma of skin of other part of trunk: Secondary | ICD-10-CM

## 2024-09-04 DIAGNOSIS — Z85828 Personal history of other malignant neoplasm of skin: Secondary | ICD-10-CM | POA: Diagnosis not present

## 2024-09-04 DIAGNOSIS — C4490 Unspecified malignant neoplasm of skin, unspecified: Secondary | ICD-10-CM

## 2024-09-04 HISTORY — DX: Basal cell carcinoma of skin, unspecified: C44.91

## 2024-09-04 NOTE — Progress Notes (Signed)
 Subjective   Gregory Mckay is a 78 y.o. male who presents for the following: Lesion(s) of concern . Patient is established patient   Today patient reports: Areas of concern on the chest   Review of Systems:    No other skin or systemic complaints except as noted in HPI or Assessment and Plan.  The following portions of the chart were reviewed this encounter and updated as appropriate: medications, allergies, medical history  Relevant Medical History:  Personal history of non melanoma skin cancer - see medical history for full details   Objective  (SKPE) Well appearing patient in no apparent distress; mood and affect are within normal limits. Examination was performed of the: Focused Exam of: Face and chest    Examination notable for: Angioma(s): Scattered red vascular papule(s)  , Lentigo/lentigines: Scattered pigmented macules that are tan to brown in color and are somewhat non-uniform in shape and concentrated in the sun-exposed areas, Seborrheic Keratosis(es): Stuck-on appearing keratotic papule(s) on the trunk, none  irritated with redness, crusting, edema, and/or partial avulsion, Actinic Damage/Elastosis: chronic sun damage: dyspigmentation, telangiectasia, and wrinkling   Deferred FBSE, full waist up exam  Left Chest 7mm pink pearly papule  Right Shoulder 7mm irrugular brown macule   Assessment & Plan  (SKAP)   BENIGN SKIN FINDINGS  - Lentigines  - Seborrheic keratoses  - Hemangiomas  - Reassurance provided regarding the benign appearance of lesions noted on exam today; no treatment is indicated in the absence of symptoms/changes. - Reinforced importance of photoprotective strategies including liberal and frequent sunscreen use of a broad-spectrum SPF 30 or greater, use of protective clothing, and sun avoidance for prevention of cutaneous malignancy and photoaging.  Counseled patient on the importance of regular self-skin monitoring as well as routine clinical skin  examinations as scheduled.   SEVERE ACTINIC DAMAGE - Chronic condition, secondary to cumulative UV/sun exposure - Recommend daily broad spectrum sunscreen SPF 30+ to sun-exposed areas, reapply every 2 hours as needed.  - Staying in the shade or wearing long sleeves, sun glasses (UVA+UVB protection) and wide brim hats (4-inch brim around the entire circumference of the hat) are also recommended for sun protection.  - Call for new or changing lesions. - Encouraged FBSE or waist up - deferred   Personal history of non melanoma skin cancer  - Reviewed medical history for full details  - Reviewed sun protective measures as above - Encouraged full body skin exams     LOC of L chest - bx and tx w/ ED&C today   Level of service outlined above   Patient instructions (SKPI)   Procedures, orders, diagnosis for this visit:  NEOPLASM OF UNCERTAIN BEHAVIOR (2) Left Chest Skin / nail biopsy Type of biopsy: tangential   Informed consent: discussed and consent obtained   Timeout: patient name, date of birth, surgical site, and procedure verified   Procedure prep:  Patient was prepped and draped in usual sterile fashion Prep type:  Isopropyl alcohol Anesthesia: the lesion was anesthetized in a standard fashion   Anesthetic:  1% lidocaine  w/ epinephrine 1-100,000 buffered w/ 8.4% NaHCO3 Instrument used: DermaBlade   Hemostasis achieved with: pressure and aluminum chloride   Outcome: patient tolerated procedure well   Post-procedure details: sterile dressing applied and wound care instructions given   Dressing type: bandage and petrolatum    Destruction of lesion Complexity: simple   Destruction method: electrodesiccation and curettage   Informed consent: discussed and consent obtained   Timeout:  patient name, date of birth, surgical site, and procedure verified Procedure prep:  Patient was prepped and draped in usual sterile fashion Prep type:  Isopropyl alcohol Anesthesia: the lesion was  anesthetized in a standard fashion   Anesthetic:  1% lidocaine  w/ epinephrine 1-100,000 local infiltration Curettage performed in three different directions: Yes   Electrodesiccation performed over the curetted area: Yes   Curettage cycles:  3 Lesion length (cm):  0.7 Lesion width (cm):  0.5 Final wound size (cm):  1.1 Hemostasis achieved with:  aluminum chloride and electrodesiccation Outcome: patient tolerated procedure well with no complications   Post-procedure details: sterile dressing applied and wound care instructions given   Dressing type: bandage and petrolatum    Specimen 1 - Surgical pathology Differential Diagnosis: BCC vs Other   Check Margins: No Right Shoulder Skin / nail biopsy Type of biopsy: tangential   Informed consent: discussed and consent obtained   Timeout: patient name, date of birth, surgical site, and procedure verified   Procedure prep:  Patient was prepped and draped in usual sterile fashion Prep type:  Isopropyl alcohol Anesthesia: the lesion was anesthetized in a standard fashion   Anesthetic:  1% lidocaine  w/ epinephrine 1-100,000 buffered w/ 8.4% NaHCO3 Instrument used: DermaBlade   Hemostasis achieved with: pressure and aluminum chloride   Outcome: patient tolerated procedure well   Post-procedure details: sterile dressing applied and wound care instructions given   Dressing type: bandage and petrolatum    Specimen 2 - Surgical pathology Differential Diagnosis: Melanoma vs Pigmented BCC vs Pigmented AK  Check Margins: No INFLAMED EPIDERMOID CYST OF SKIN Left Chest Incision and Drainage - Left Chest Location: Left chest   Informed Consent: Discussed risks (permanent scarring, light or dark discoloration, infection, pain, bleeding, bruising, redness, damage to adjacent structures, and recurrence of the lesion) and benefits of the procedure, as well as the alternatives.  Informed consent was obtained.  Preparation: The area was prepped with  alcohol.  Anesthesia: Lidocaine  1% with epinephrine  Procedure Details: An incision was made overlying the lesion. The lesion drained white, chalky cyst material.  A small amount of fluid was drained.    Antibiotic ointment and a sterile pressure dressing were applied. The patient tolerated procedure well.  Total number of lesions drained: 1  Plan: The patient was instructed on post-op care. Recommend OTC analgesia as needed for pain.    Neoplasm of uncertain behavior -     Skin / nail biopsy -     Skin / nail biopsy -     Destruction of lesion -     Surgical pathology; Standing  Inflamed epidermoid cyst of skin -     Incision and Drainage    Return to clinic: Return if symptoms worsen or fail to improve.  I, Emerick Ege, CMA am acting as scribe for Lauraine JAYSON Kanaris, MD.   Documentation: I have reviewed the above documentation for accuracy and completeness, and I agree with the above.  Lauraine JAYSON Kanaris, MD

## 2024-09-04 NOTE — Patient Instructions (Signed)

## 2024-09-09 ENCOUNTER — Ambulatory Visit: Payer: Self-pay

## 2024-09-09 LAB — SURGICAL PATHOLOGY

## 2024-09-09 NOTE — Progress Notes (Signed)
 LMTRC

## 2024-09-10 NOTE — Telephone Encounter (Signed)
 Patient advised of BX results. Scheduled for a spot to be checked on his face and will schedule 6 month follow up from there. aw

## 2024-09-23 ENCOUNTER — Ambulatory Visit

## 2024-09-23 DIAGNOSIS — L821 Other seborrheic keratosis: Secondary | ICD-10-CM

## 2024-09-23 DIAGNOSIS — L738 Other specified follicular disorders: Secondary | ICD-10-CM | POA: Diagnosis not present

## 2024-09-23 DIAGNOSIS — Z7189 Other specified counseling: Secondary | ICD-10-CM

## 2024-09-23 DIAGNOSIS — I781 Nevus, non-neoplastic: Secondary | ICD-10-CM | POA: Diagnosis not present

## 2024-09-23 NOTE — Patient Instructions (Addendum)
 Counseling for BBL / IPL / Laser and Coordination of Care Discussed the treatment option of Broad Band Light (BBL) /Intense Pulsed Light (IPL)/ Laser for skin discoloration, including brown spots and redness.  Typically we recommend at least 1-3 treatment sessions about 5-8 weeks apart for best results.  Cannot have tanned skin when BBL performed, and regular use of sunscreen/photoprotection is advised after the procedure to help maintain results. The patient's condition may also require maintenance treatments in the future.  The fee for BBL / laser treatments is $350 per treatment session for the whole face.  A fee can be quoted for other parts of the body.  Insurance typically does not pay for BBL/laser treatments and therefore the fee is an out-of-pocket cost. Recommend prophylactic valtrex  treatment. Once scheduled for procedure, will send Rx in prior to patient's appointment.     Due to recent changes in healthcare laws, you may see results of your pathology and/or laboratory studies on MyChart before the doctors have had a chance to review them. We understand that in some cases there may be results that are confusing or concerning to you. Please understand that not all results are received at the same time and often the doctors may need to interpret multiple results in order to provide you with the best plan of care or course of treatment. Therefore, we ask that you please give us  2 business days to thoroughly review all your results before contacting the office for clarification. Should we see a critical lab result, you will be contacted sooner.   If You Need Anything After Your Visit  If you have any questions or concerns for your doctor, please call our main line at [phone] and press option 4 to reach your doctor's medical assistant. If no one answers, please leave a voicemail as directed and we will return your call as soon as possible. Messages left after 4 pm will be answered the  following business day.   You may also send us  a message via MyChart. We typically respond to MyChart messages within 1-2 business days.  For prescription refills, please ask your pharmacy to contact our office. Our fax number is [phone].  If you have an urgent issue when the clinic is closed that cannot wait until the next business day, you can page your doctor at the number below.    Please note that while we do our best to be available for urgent issues outside of office hours, we are not available 24/7.   If you have an urgent issue and are unable to reach us , you may choose to seek medical care at your doctor's office, retail clinic, urgent care center, or emergency room.  If you have a medical emergency, please immediately call 911 or go to the emergency department.  Pager Numbers  - Dr. Hester: [phone]  - Dr. Jackquline: [phone]  - Dr. Claudene: [phone]   - Dr. Raymund: [phone]  In the event of inclement weather, please call our main line at [phone] for an update on the status of any delays or closures.  Dermatology Medication Tips: Please keep the boxes that topical medications come in in order to help keep track of the instructions about where and how to use these. Pharmacies typically print the medication instructions only on the boxes and not directly on the medication tubes.   If your medication is too expensive, please contact our office at [phone] option 4 or send us  a message through MyChart.   We  are unable to tell what your co-pay for medications will be in advance as this is different depending on your insurance coverage. However, we may be able to find a substitute medication at lower cost or fill out paperwork to get insurance to cover a needed medication.   If a prior authorization is required to get your medication covered by your insurance company, please allow us  1-2 business days to complete this process.  Drug prices often vary  depending on where the prescription is filled and some pharmacies may offer cheaper prices.  The website www.goodrx.com contains coupons for medications through different pharmacies. The prices here do not account for what the cost may be with help from insurance (it may be cheaper with your insurance), but the website can give you the price if you did not use any insurance.  - You can print the associated coupon and take it with your prescription to the pharmacy.  - You may also stop by our office during regular business hours and pick up a GoodRx coupon card.  - If you need your prescription sent electronically to a different pharmacy, notify our office through [facility] or by phone at [phone] option 4.     Si Usted Necesita Algo Despus de Su Visita  Tambin puede enviarnos un mensaje a travs de Clinical cytogeneticist. Por lo general respondemos a los mensajes de MyChart en el transcurso de 1 a 2 das hbiles.  Para renovar recetas, por favor pida a su farmacia que se ponga en contacto con nuestra oficina. Randi lakes de fax es [place] [phone].  Si tiene un asunto urgente cuando la clnica est cerrada y que no puede esperar hasta el siguiente da hbil, puede llamar/localizar a su doctor(a) al nmero que aparece a continuacin.   Por favor, tenga en cuenta que aunque hacemos todo lo posible para estar disponibles para asuntos urgentes fuera del horario de [place], no estamos disponibles las 24 horas del da, los 7 [address]  [address] de la [place].   Si tiene un problema urgente y no puede comunicarse con nosotros, puede optar por buscar atencin mdica  en el consultorio de su doctor(a), en una clnica privada, en un centro de atencin urgente o en una sala de emergencias.  Si tiene Engineer, drilling, por favor llame inmediatamente al 911 o vaya a la sala de emergencias.  Nmeros de bper  - Dr. Hester: [phone]  - Dra. Jackquline: [phone]  - Dr. Claudene: [phone]  - Dra.  Kitts: [phone]  En caso de inclemencias del [place], por favor llame a nuestra lnea principal al [phone] para una actualizacin sobre el estado de cualquier retraso o cierre.  Consejos para la medicacin en dermatologa: Por favor, guarde las cajas en las que vienen los medicamentos de uso tpico para ayudarle a seguir las instrucciones sobre dnde y cmo usarlos. Las farmacias generalmente imprimen las instrucciones del medicamento slo en las cajas y no directamente en los tubos del [place].   Si su medicamento es muy caro, por favor, pngase en contacto con landry rieger llamando al [phone] y presione la opcin 4 o envenos un mensaje a travs de Clinical cytogeneticist.   No podemos decirle cul ser su copago por los medicamentos por adelantado ya que esto es diferente dependiendo de la cobertura de su seguro. Sin embargo, es posible que podamos encontrar un medicamento sustituto a Audiological scientist un formulario para que el seguro cubra el medicamento que se considera necesario.   Si se requiere una autorizacin  previa para que su compaa de seguros malta su medicamento, por favor permtanos de 1 a 2 das hbiles para completar este proceso.  Los precios de los medicamentos varan con frecuencia dependiendo del Environmental consultant de dnde se surte la receta y alguna farmacias pueden ofrecer precios ms baratos.  El sitio web www.goodrx.com tiene cupones para medicamentos de Health and safety inspector. Los precios aqu no tienen en cuenta lo que podra costar con la ayuda del seguro (puede ser ms barato con su seguro), pero el sitio web puede darle el precio si no utiliz Tourist information centre manager.  - Puede imprimir el cupn correspondiente y llevarlo con su receta a la farmacia.  - Tambin puede pasar por nuestra oficina durante el horario de atencin regular y Education officer, museum una tarjeta de cupones de GoodRx.  - Si necesita que su receta se enve electrnicamente a una farmacia diferente, informe a nuestra oficina a  travs de MyChart de [place] o por telfono llamando al [phone] y presione la opcin 4.

## 2024-09-23 NOTE — Progress Notes (Signed)
" °  °  Subjective   Gregory Mckay is a 78 y.o. male who presents for the following: patient is here for follow up for red veins at face and mole he reports at left cheek. Patient is established patient   Today patient reports: Patient refused fbse - states he would rather not schedule all over check at this time. Prefers to just call if he notices anything he is concerned about  Today concerned with red veins at cheeks would like to discuss treatment and mole at left preauricular he would like checked.   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 Reports healed.   Telangectasia photos           Review of Systems:    No other skin or systemic complaints except as noted in HPI or Assessment and Plan.  The following portions of the chart were reviewed this encounter and updated as appropriate: medications, allergies, medical history  Relevant Medical History:  n/a   Objective  (SKPE) Well appearing patient in no apparent distress; mood and affect are within normal limits. Examination was performed of the: Focused Exam of: face   Examination notable for: Seborrheic Keratosis(es): Stuck-on appearing keratotic papule(s) on the trunk, some  irritated with redness, crusting, edema, and/or partial avulsion - L cheek  Telangiectasias, sebaceous gland hyperplasia of face  Examination limited by: Clothing     Assessment & Plan  (SKAP)    Telangiectasis and telangiectasia of face Discussed condition, chronicity, benign nature of spots. For cosmesis, best approach is vascular laser. Considered cosmetic service.  Counseling for BBL / IPL / Laser and Coordination of Care Discussed the treatment option of Broad Band Light (BBL) /Intense Pulsed Light (IPL)/ Laser for skin discoloration, including brown spots and redness.  Typically we recommend at least 1-3 treatment sessions about 5-8 weeks apart for best results.  Cannot have tanned skin when BBL performed, and regular use of  sunscreen/photoprotection is advised after the procedure to help maintain results. The patient's condition may also require maintenance treatments in the future.  The fee for BBL / laser treatments is $350 per treatment session for the whole face.  A fee can be quoted for other parts of the body.  Insurance typically does not pay for BBL/laser treatments and therefore the fee is an out-of-pocket cost. Recommend prophylactic valtrex treatment. Once scheduled for procedure, will send Rx in prior to patient's appointment.  Discussed may need 4 to 7 sessions to see results  - can schedule with Dr. Hester or Dr. Jackquline  - Photos today will message Dr. Hester / Dr. 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 - Observe - Call for any changes     Procedures, orders, diagnosis for this visit:    There are no diagnoses linked to this encounter.  Return to clinic: No follow-ups on file.  I, Eleanor Blush, CMA, am acting as scribe for Lauraine JAYSON Kanaris, MD.   Documentation: I have reviewed the above documentation for accuracy and completeness, and I agree with the above.  Lauraine JAYSON Kanaris, MD  "

## 2024-10-01 ENCOUNTER — Other Ambulatory Visit: Payer: Self-pay | Admitting: Urology

## 2024-10-02 ENCOUNTER — Telehealth: Payer: Self-pay

## 2024-10-02 NOTE — Telephone Encounter (Signed)
 Received a clearance form for pt. Has surgery 10/15/24. Called pt to schedule an appt, no answer. Pt will need to be seen to have the clearance letter filled out, he may schedule with any provider who has an availability. Detailed messaged was left in pt mailbox. Advised to call to schedule at [phone].

## 2024-10-07 ENCOUNTER — Telehealth: Payer: Self-pay

## 2024-10-07 NOTE — Patient Instructions (Signed)
 SURGICAL WAITING ROOM VISITATION  Patients having surgery or a procedure may have no more than 2 support people in the waiting area - these visitors may rotate.    Children ages 21 and under will not be able to visit patients in [facility] under most circumstances.   Visitors with respiratory illnesses are discouraged from visiting and should remain at home.  If the patient needs to stay at the hospital during part of their recovery, the visitor guidelines for inpatient rooms apply. Pre-op nurse will coordinate an appropriate time for 1 support person to accompany patient in pre-op.  This support person may not rotate.    Please refer to the [facility] website for the visitor guidelines for Inpatients (after your surgery is over and you are in a regular room).       Your procedure is scheduled on:  10/15/2024    Report to [facility] Main Entrance    Report to admitting at  1130 AM   Call this number if you have problems the morning of surgery [phone]   Do not eat food :After Midnight.   After Midnight you may have the following liquids until ___ 1030___ AM  DAY OF SURGERY  Water Non-Citrus Juices (without pulp, NO RED-Apple, White grape, White cranberry) Black Coffee (NO MILK/CREAM OR CREAMERS, sugar ok)  Clear Tea (NO MILK/CREAM OR CREAMERS, sugar ok) regular and decaf                             Plain Jell-O (NO RED)                                           Fruit ices (not with fruit pulp, NO RED)                                     Popsicles (NO RED)                                                               Sports drinks like Gatorade (NO RED)                         If you have questions, please contact your surgeons office.       Oral Hygiene is also important to reduce your risk of infection.                                    Remember - BRUSH YOUR TEETH THE MORNING OF SURGERY WITH YOUR REGULAR TOOTHPASTE  DENTURES WILL BE REMOVED  PRIOR TO SURGERY PLEASE DO NOT APPLY Poly grip OR ADHESIVES!!!   Do NOT smoke after Midnight   Stop all vitamins and herbal supplements 7 days before surgery.   Take these medicines the morning of surgery with A SIP OF WATER:  magnesium, thyroid  armour   DO NOT TAKE ANY ORAL DIABETIC MEDICATIONS DAY OF YOUR SURGERY  Bring CPAP mask  and tubing day of surgery.                              You may not have any metal on your body including hair pins, jewelry, and body piercing             Do not wear make-up, lotions, powders, perfumes/cologne, or deodorant  Do not wear nail polish including gel and S&S, artificial/acrylic nails, or any other type of covering on natural nails including finger and toenails. If you have artificial nails, gel coating, etc. that needs to be removed by a nail salon please have this removed prior to surgery or surgery may need to be canceled/ delayed if the surgeon/ anesthesia feels like they are unable to be safely monitored.   Do not shave  48 hours prior to surgery.               Men may shave face and neck.   Do not bring valuables to the hospital. [facility] IS NOT             RESPONSIBLE   FOR VALUABLES.   Contacts, glasses, dentures or bridgework may not be worn into surgery.   Bring small overnight bag day of surgery.   DO NOT BRING YOUR HOME MEDICATIONS TO THE HOSPITAL. PHARMACY WILL DISPENSE MEDICATIONS LISTED ON YOUR MEDICATION LIST TO YOU DURING YOUR ADMISSION IN THE HOSPITAL!    Patients discharged on the day of surgery will not be allowed to drive home.  Someone NEEDS to stay with you for the first 24 hours after anesthesia.   Special Instructions: Bring a copy of your healthcare power of attorney and living will documents the day of surgery if you haven't scanned them before.              Please read over the following fact sheets you were given: IF YOU HAVE QUESTIONS ABOUT YOUR PRE-OP INSTRUCTIONS PLEASE CALL [phone].   If you  received a COVID test during your pre-op visit  it is requested that you wear a mask when out in public, stay away from anyone that may not be feeling well and notify your surgeon if you develop symptoms. If you test positive for Covid or have been in contact with anyone that has tested positive in the last 10 days please notify you surgeon.    [facility] - Preparing for Surgery Before surgery, you can play an important role.  Because skin is not sterile, your skin needs to be as free of germs as possible.  You can reduce the number of germs on your skin by washing with CHG (chlorahexidine gluconate) soap before surgery.  CHG is an antiseptic cleaner which kills germs and bonds with the skin to continue killing germs even after washing. Please DO NOT use if you have an allergy to CHG or antibacterial soaps.  If your skin becomes reddened/irritated stop using the CHG and inform your nurse when you arrive at Short Stay. Do not shave (including legs and underarms) for at least 48 hours prior to the first CHG shower.  You may shave your face/neck.  Please follow these instructions carefully:  1.  Shower with CHG Soap the night before surgery ONLY (DO NOT USE THE SOAP THE MORNING OF SURGERY).  2.  If you choose to wash your hair, wash your hair first as usual with your normal  shampoo.  3.  After you  shampoo, rinse your hair and body thoroughly to remove the shampoo.                             4.  Use CHG as you would any other liquid soap.  You can apply chg directly to the skin and wash.  Gently with a scrungie or clean washcloth.  5.  Apply the CHG Soap to your body ONLY FROM THE NECK DOWN.   Do   not use on face/ open                           Wound or open sores. Avoid contact with eyes, ears mouth and   genitals (private parts).                       Wash face,  Genitals (private parts) with your normal soap.             6.  Wash thoroughly, paying special attention to the area where your     surgery  will be performed.  7.  Thoroughly rinse your body with warm water from the neck down.  8.  DO NOT shower/wash with your normal soap after using and rinsing off the CHG Soap.                9.  Pat yourself dry with a clean towel.            10.  Wear clean pajamas.            11.  Place clean sheets on your bed the night of your first shower and do not  sleep with pets. Day of Surgery : Do not apply any CHG, lotions/deodorants the morning of surgery.  Please wear clean clothes to the hospital/surgery center.  FAILURE TO FOLLOW THESE INSTRUCTIONS MAY RESULT IN THE CANCELLATION OF YOUR SURGERY  PATIENT SIGNATURE_________________________________  NURSE SIGNATURE__________________________________  ________________________________________________________________________

## 2024-10-07 NOTE — Telephone Encounter (Signed)
 Patient called asking if Dr Raymund had discussed BBL laser for telangectasia on his face

## 2024-10-07 NOTE — Progress Notes (Signed)
 Anesthesia Review:  PCP: Isaiah Pepper- LOV 05/30/24  Cardiologist :Pendyal LOV 08/08/24   PPM/ ICD: Device Orders: Rep Notified:  Chest x-ray : EKG : Echo : Stress test: Cardiac Cath :   Activity level:  Sleep Study/ CPAP : Fasting Blood Sugar :      / Checks Blood Sugar -- times a day:    Blood Thinner/ Instructions /Last Dose: ASA / Instructions/ Last Dose :

## 2024-10-09 ENCOUNTER — Other Ambulatory Visit: Payer: Self-pay

## 2024-10-09 ENCOUNTER — Encounter (HOSPITAL_COMMUNITY)
Admission: RE | Admit: 2024-10-09 | Discharge: 2024-10-09 | Disposition: A | Discharge status: Home / Self Care | Source: Ambulatory Visit | Attending: Urology | Admitting: Urology

## 2024-10-09 ENCOUNTER — Encounter (HOSPITAL_COMMUNITY): Payer: Self-pay

## 2024-10-09 VITALS — BP 112/69 | HR 67 | Temp 99.0°F | Resp 16 | Ht 71.0 in | Wt 192.0 lb

## 2024-10-09 DIAGNOSIS — N189 Chronic kidney disease, unspecified: Secondary | ICD-10-CM | POA: Insufficient documentation

## 2024-10-09 DIAGNOSIS — I129 Hypertensive chronic kidney disease with stage 1 through stage 4 chronic kidney disease, or unspecified chronic kidney disease: Secondary | ICD-10-CM | POA: Diagnosis not present

## 2024-10-09 DIAGNOSIS — I2510 Atherosclerotic heart disease of native coronary artery without angina pectoris: Secondary | ICD-10-CM | POA: Diagnosis not present

## 2024-10-09 DIAGNOSIS — Z01818 Encounter for other preprocedural examination: Secondary | ICD-10-CM | POA: Insufficient documentation

## 2024-10-09 DIAGNOSIS — I480 Paroxysmal atrial fibrillation: Secondary | ICD-10-CM | POA: Insufficient documentation

## 2024-10-09 DIAGNOSIS — I700 Atherosclerosis of aorta: Secondary | ICD-10-CM | POA: Diagnosis not present

## 2024-10-09 DIAGNOSIS — N200 Calculus of kidney: Secondary | ICD-10-CM | POA: Insufficient documentation

## 2024-10-09 DIAGNOSIS — K8020 Calculus of gallbladder without cholecystitis without obstruction: Secondary | ICD-10-CM | POA: Diagnosis not present

## 2024-10-09 DIAGNOSIS — E039 Hypothyroidism, unspecified: Secondary | ICD-10-CM | POA: Insufficient documentation

## 2024-10-09 DIAGNOSIS — K5730 Diverticulosis of large intestine without perforation or abscess without bleeding: Secondary | ICD-10-CM | POA: Insufficient documentation

## 2024-10-09 DIAGNOSIS — N401 Enlarged prostate with lower urinary tract symptoms: Secondary | ICD-10-CM | POA: Diagnosis not present

## 2024-10-09 HISTORY — DX: Hypothyroidism, unspecified: E03.9

## 2024-10-09 HISTORY — DX: Personal history of urinary calculi: Z87.442

## 2024-10-09 LAB — BASIC METABOLIC PANEL WITH GFR
Anion gap: 8 (ref 5–15)
BUN: 27 mg/dL — ABNORMAL HIGH (ref 8–23)
CO2: 25 mmol/L (ref 22–32)
Calcium: 10.4 mg/dL — ABNORMAL HIGH (ref 8.9–10.3)
Chloride: 106 mmol/L (ref 98–111)
Creatinine, Ser: 1.3 mg/dL — ABNORMAL HIGH (ref 0.61–1.24)
GFR, Estimated: 57 mL/min — ABNORMAL LOW
Glucose, Bld: 86 mg/dL (ref 70–99)
Potassium: 4.7 mmol/L (ref 3.5–5.1)
Sodium: 139 mmol/L (ref 135–145)

## 2024-10-09 LAB — CBC
HCT: 54.4 % — ABNORMAL HIGH (ref 39.0–52.0)
Hemoglobin: 17.4 g/dL — ABNORMAL HIGH (ref 13.0–17.0)
MCH: 29.1 pg (ref 26.0–34.0)
MCHC: 32 g/dL (ref 30.0–36.0)
MCV: 91 fL (ref 80.0–100.0)
Platelets: 152 K/uL (ref 150–400)
RBC: 5.98 MIL/uL — ABNORMAL HIGH (ref 4.22–5.81)
RDW: 14.2 % (ref 11.5–15.5)
WBC: 7.7 K/uL (ref 4.0–10.5)
nRBC: 0 % (ref 0.0–0.2)

## 2024-10-10 ENCOUNTER — Ambulatory Visit (INDEPENDENT_AMBULATORY_CARE_PROVIDER_SITE_OTHER): Admitting: Family Medicine

## 2024-10-10 ENCOUNTER — Encounter: Payer: Self-pay | Admitting: Family Medicine

## 2024-10-10 VITALS — BP 107/59 | HR 63 | Resp 16 | Ht 71.0 in | Wt 193.0 lb

## 2024-10-10 DIAGNOSIS — N400 Enlarged prostate without lower urinary tract symptoms: Secondary | ICD-10-CM

## 2024-10-10 DIAGNOSIS — I480 Paroxysmal atrial fibrillation: Secondary | ICD-10-CM

## 2024-10-10 DIAGNOSIS — Z01818 Encounter for other preprocedural examination: Secondary | ICD-10-CM

## 2024-10-10 DIAGNOSIS — I10 Essential (primary) hypertension: Secondary | ICD-10-CM | POA: Diagnosis not present

## 2024-10-10 NOTE — Progress Notes (Unsigned)
 "     Established patient visit   Patient: Gregory Mckay   DOB: 1946-04-09   79 y.o. Male  MRN: [id number] Visit Date: 10/10/2024  Today's healthcare provider: Jon Eva, MD   Chief Complaint  Patient presents with   Pre-op Exam   Subjective    HPI   Discussed the use of AI scribe software for clinical note transcription with the patient, who gave verbal consent to proceed.  History of Present Illness          Pt is a 79 y.o. male who is here for preoperative clearance for aquablation of prostate.  1) High Risk Cardiac Conditions  1) Recent MI - No.  2) Decompensated Heart Failure - No.  3) Unstable angina - No.  4) Symptomatic arrythmia - No.  5) Sx Valvular Disease - No.  2) Intermediate Risk Factors - DM, CKD, CVA, CHF, CAD - No.  2) Functional Status - > 4 mets (Walk, run, climb stairs) Yes.  SABRA Hails Activity Status Index: 58.2 (9.89 Mets)  3) Surgery Specific Risk - Intermediate   4) Further Noninvasive evaluation -   1) EKG - No.   1) Hx of CVA, CAD, DM, CKD  2) Echo - No.   1) Worsening dyspnea   3) Stress Testing - Active Cardiac Disease - No.  5) Need for medical therapy - Beta Blocker, Statins indicated ? No.   Medications: Show/hide medication list[1]  Review of Systems {Insert previous labs (optional):23779} {See past labs  Heme  Chem  Endocrine  Serology  Results Review (optional):1}   Objective    BP (!) 107/59 (BP Location: Left Arm, Patient Position: Sitting, Cuff Size: Large)   Pulse 63   Resp 16   Ht 5' 11 (1.803 m)   Wt 193 lb (87.5 kg)   SpO2 99%   BMI 26.92 kg/m  {Insert last BP/Wt (optional):23777}{See vitals history (optional):1}  Physical Exam Vitals reviewed.  Constitutional:      General: He is not in acute distress.    Appearance: Normal appearance. He is not diaphoretic.  HENT:     Head: Normocephalic and atraumatic.  Eyes:     General: No scleral icterus.    Conjunctiva/sclera: Conjunctivae normal.   Cardiovascular:     Rate and Rhythm: Normal rate and regular rhythm.     Heart sounds: Normal heart sounds. No murmur heard. Pulmonary:     Effort: Pulmonary effort is normal. No respiratory distress.     Breath sounds: Normal breath sounds. No wheezing or rhonchi.  Musculoskeletal:     Cervical back: Neck supple.     Right lower leg: No edema.     Left lower leg: No edema.  Lymphadenopathy:     Cervical: No cervical adenopathy.  Skin:    General: Skin is warm and dry.     Findings: No rash.  Neurological:     Mental Status: He is alert and oriented to person, place, and time. Mental status is at baseline.  Psychiatric:        Mood and Affect: Mood normal.        Behavior: Behavior normal.      No results found for any visits on 10/10/24.  Assessment & Plan     Problem List Items Addressed This Visit   None               I have independently evaluated patient.  Gregory Mckay is a 79 y.o. male who  is average risk for a moderate risk surgery.  There are not modifiable risk factors (smoking, etc). Gregory Mckay's RCRI/NSQIP calculation for MACE is: average risk.      No follow-ups on file.       Jon Eva, MD  [facility] [phone] (phone) [phone] (fax)  [facility]       [1] Outpatient Medications Prior to Visit  Medication Sig   amLODipine -benazepril  (LOTREL) 5-10 MG capsule Take 2 capsules by mouth daily.   apixaban  (ELIQUIS ) 5 MG TABS tablet Take 1 tablet (5 mg total) by mouth 2 (two) times daily.   Ascorbic Acid (VITAMIN C) 500 MG CAPS Take 1 capsule by mouth daily.   cephALEXin (KEFLEX) 250 MG capsule Take 250 mg by mouth daily.   Cholecalciferol (VITAMIN D3 PO) Take 1 tablet by mouth in the morning.   CINNAMON PO Take 1 capsule by mouth in the morning and at bedtime.   Coenzyme Q10 (COQ-10 PO) Take 1 capsule by mouth in the morning and at bedtime.   glucosamine-chondroitin  (GLUCOSAMINE-CHONDROITIN DS) 500-400 MG tablet Take 3 tablets by mouth in the morning and at bedtime.   Gymnema Sylvestris Leaf POWD Take 1 capsule by mouth 2 (two) times daily.   Hydrocortisone  Acetate 1 % OINT Apply externally to hemorrhoids BID prn   MAGNESIUM PO Take 1 capsule by mouth in the morning and at bedtime.   melatonin 5 MG TABS Take 5 mg by mouth at bedtime.   Methylsulfonylmethane (MSM PO) Take 1 tablet by mouth in the morning and at bedtime.   OVER THE COUNTER MEDICATION Take 1 tablet by mouth daily. B Active   OVER THE COUNTER MEDICATION Take 1 tablet by mouth daily. One-Sight   OVER THE COUNTER MEDICATION Take 1 tablet by mouth daily. Kyolic   rosuvastatin  (CRESTOR ) 5 MG tablet Take 1 tablet (5 mg total) by mouth daily.   Specialty Vitamins Products (INULOSE BLOOD SUGAR SUPPORT) CAPS Take 1 capsule by mouth in the morning and at bedtime.   Specialty Vitamins Products (LIPOTRIAD VISION SUPPORT PO) Take 1 capsule by mouth in the morning and at bedtime. Vision Support by Sight   tadalafil  (CIALIS ) 5 MG tablet Take 1 tablet (5 mg total) by mouth daily.   Testosterone  1.62 % GEL APPLY 2 PUMPS TO INNER BICEP OR CHEST EVERY MORNING AS DIRECTED.   thyroid  (ARMOUR THYROID ) 60 MG tablet Take 1 tablet (60 mg total) by mouth daily before breakfast.   VITAMIN E PO Take 1 capsule by mouth in the morning.   VITAMIN K PO Take 1 tablet by mouth daily in the afternoon. Vitamin K1 + K2   No facility-administered medications prior to visit.  "

## 2024-10-10 NOTE — Progress Notes (Signed)
 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  Patient follows with Cardiology at [facility] for PAF on Eliquis . Last seen on 08/08/24. He reported that he stopped his Eliquis  due to hematuria and has not restarted it (although cardiologist recommended that he should after CT urogram was done). Advised f/u in 6 months and have repeat Echo at that time for aortic root dilation.  Seen by PCP on 10/10/24 for pre op clearance. Per Dr. Myrla: I have independently evaluated patient.  Gregory Mckay is a 79 y.o. male who is average risk for a moderate risk surgery.  There are not modifiable risk factors (smoking, etc). Zedrick Springsteen Stigger's RCRI/NSQIP calculation for MACE is: average risk.    LD Eliquis : ~2 months ago  VS: BP 112/69   Pulse 67   Temp 37.2 C (Oral)   Resp 16   Ht 5' 11 (1.803 m)   Wt 87.1 kg   SpO2 99%   BMI 26.78 kg/m   PROVIDERS: Myrla Jon HERO, MD   LABS: Labs reviewed: Acceptable for surgery. (all labs ordered are listed, but only abnormal results are displayed)  Labs Reviewed  CBC - Abnormal; Notable for the following components:      Result Value   RBC 5.98 (*)    Hemoglobin 17.4 (*)    HCT 54.4 (*)    All other components within normal limits  BASIC METABOLIC PANEL WITH GFR - Abnormal; Notable for the following components:   BUN 27 (*)    Creatinine, Ser 1.30 (*)    Calcium  10.4 (*)    GFR, Estimated 57 (*)    All other components within normal limits    CT Renal  06/24/24:  IMPRESSION: 1. 8 mm nonobstructing left renal stone. No evidence of ureteral stone. Mild stable stranding/fluid over the left perinephric fat and adjacent to the left ureter. 2. Cholelithiasis. 3. Colonic diverticulosis. 4. Aortic atherosclerosis. Atherosclerotic coronary artery disease. 5. Tiny amount of bilateral pleural fluid with associated bibasilar atelectatic change.   Aortic Atherosclerosis (ICD10-I70.0).  EKG 01/29/24 ( [facility] - report only):  Sinus rhythm with fusion complexes Low voltage QRS Incomplete right bundle branch block Left anterior fascicular block When compared with ECG of 15-Feb-2023 14:48, Left anterior fascicular block is now present Left posterior fascicular block is no longer present Incomplete right bundle branch block has replaced Right bundle branch block Minimal criteria for Inferior infarct are no longer present  Echo 08/31/2023 ([facility]):  CONCLUSION ------------------------------------------------------------------------------- NORMAL LEFT VENTRICULAR SYSTOLIC FUNCTION WITH MILD LVH ESTIMATED EF: >55% NORMAL LA PRESSURES WITH DIASTOLIC DYSFUNCTION (GRADE 1) NORMAL RIGHT VENTRICULAR SYSTOLIC FUNCTION VALVULAR REGURGITATION: No AR, TRIVIAL MR, MILD PR, MILD TR NO VALVULAR STENOSIS PHYSICIAN IMPRESSIONS -------------------------------------------------------------------- ASCENDING Ao ROOT 4.3 LAVi 39 cc/m2  Past Medical History:  Diagnosis Date   Basal cell carcinoma 10/08/2019   Right calf. Nodular   Basal cell carcinoma 10/08/2019   Right mid medial dorsum forearm. Nodular and infiltrative   Basal cell carcinoma 11/05/2019   Left superior pectoral/medial  infraclavicular. Nodular.   Basal cell carcinoma 03/02/2023   R lower back, EDC   BCC (basal cell carcinoma) 09/04/2024   left chest, treated with EDC   Cataract    History of kidney stones    Hypertension    Hypothyroidism    Thyroid  disease     Past Surgical History:   Procedure Laterality Date   COLONOSCOPY WITH PROPOFOL  N/A 02/01/2022   Procedure: COLONOSCOPY WITH PROPOFOL ;  Surgeon: Therisa Bi, MD;  Location: [facility];  Service: Gastroenterology;  Laterality: N/A;   EYE SURGERY     HERNIA REPAIR  1998   abdominal   urolift surgery       MEDICATIONS:  amLODipine -benazepril  (LOTREL) 5-10 MG capsule   apixaban  (ELIQUIS ) 5 MG TABS tablet   Ascorbic Acid (VITAMIN C) 500 MG CAPS   cephALEXin (KEFLEX) 250 MG capsule   Cholecalciferol (VITAMIN D3 PO)   CINNAMON PO   Coenzyme Q10 (COQ-10 PO)   glucosamine-chondroitin (GLUCOSAMINE-CHONDROITIN DS) 500-400 MG tablet   Gymnema Sylvestris Leaf POWD   Hydrocortisone  Acetate 1 % OINT   MAGNESIUM PO   melatonin 5 MG TABS   Methylsulfonylmethane (MSM PO)   OVER THE COUNTER MEDICATION   OVER THE COUNTER MEDICATION   OVER THE COUNTER MEDICATION   rosuvastatin  (CRESTOR ) 5 MG tablet   Specialty Vitamins Products (INULOSE BLOOD SUGAR SUPPORT) CAPS   Specialty Vitamins Products (LIPOTRIAD VISION SUPPORT PO)   tadalafil  (CIALIS ) 5 MG tablet   Testosterone  1.62 % GEL   thyroid  (ARMOUR THYROID ) 60 MG tablet   VITAMIN E PO   VITAMIN K PO   No current facility-administered medications for this encounter.   Burnard CHRISTELLA Odis DEVONNA [facility] Short Stay/Anesthesiology [facility] Phone [phone] 10/11/2024 11:03 AM        "
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
## 2024-10-15 ENCOUNTER — Ambulatory Visit (HOSPITAL_COMMUNITY)
Admission: RE | Admit: 2024-10-15 | Discharge: 2024-10-15 | Disposition: A | Discharge status: Home / Self Care | Source: Ambulatory Visit | Attending: Urology | Admitting: Urology

## 2024-10-15 ENCOUNTER — Ambulatory Visit (HOSPITAL_COMMUNITY): Admitting: Anesthesiology

## 2024-10-15 ENCOUNTER — Ambulatory Visit (HOSPITAL_COMMUNITY): Payer: Self-pay | Admitting: Medical

## 2024-10-15 ENCOUNTER — Encounter (HOSPITAL_COMMUNITY): Payer: Self-pay | Admitting: Urology

## 2024-10-15 ENCOUNTER — Encounter (HOSPITAL_COMMUNITY)
Admission: RE | Disposition: A | Discharge status: Home / Self Care | Payer: Self-pay | Source: Ambulatory Visit | Attending: Urology

## 2024-10-15 DIAGNOSIS — I10 Essential (primary) hypertension: Secondary | ICD-10-CM | POA: Diagnosis not present

## 2024-10-15 DIAGNOSIS — N289 Disorder of kidney and ureter, unspecified: Secondary | ICD-10-CM | POA: Diagnosis not present

## 2024-10-15 DIAGNOSIS — N401 Enlarged prostate with lower urinary tract symptoms: Secondary | ICD-10-CM | POA: Diagnosis not present

## 2024-10-15 DIAGNOSIS — E039 Hypothyroidism, unspecified: Secondary | ICD-10-CM | POA: Insufficient documentation

## 2024-10-15 DIAGNOSIS — N320 Bladder-neck obstruction: Secondary | ICD-10-CM | POA: Insufficient documentation

## 2024-10-15 DIAGNOSIS — Z79899 Other long term (current) drug therapy: Secondary | ICD-10-CM | POA: Insufficient documentation

## 2024-10-15 DIAGNOSIS — Z01818 Encounter for other preprocedural examination: Secondary | ICD-10-CM

## 2024-10-15 MED ORDER — ROCURONIUM BROMIDE 10 MG/ML (PF) SYRINGE
PREFILLED_SYRINGE | INTRAVENOUS | Status: AC
  Filled 2024-10-15: qty 10

## 2024-10-15 MED ORDER — FENTANYL CITRATE (PF) 100 MCG/2ML IJ SOLN
INTRAMUSCULAR | Status: AC
  Filled 2024-10-15: qty 2

## 2024-10-15 MED ORDER — LACTATED RINGERS IV SOLN: INTRAVENOUS | Status: DC

## 2024-10-15 MED ORDER — SODIUM CHLORIDE 0.9 % IR SOLN
Status: DC | PRN
  Administered 2024-10-15: 6000 mL via INTRAVESICAL
  Administered 2024-10-15: 3000 mL via INTRAVESICAL

## 2024-10-15 MED ORDER — ACETAMINOPHEN 10 MG/ML IV SOLN
1000.0000 mg | Freq: Once | INTRAVENOUS | Status: DC | PRN
  Administered 2024-10-15: 1000 mg via INTRAVENOUS

## 2024-10-15 MED ORDER — FENTANYL CITRATE (PF) 50 MCG/ML IJ SOSY
PREFILLED_SYRINGE | INTRAMUSCULAR | Status: AC
  Filled 2024-10-15: qty 1

## 2024-10-15 MED ORDER — FENTANYL CITRATE (PF) 50 MCG/ML IJ SOSY
25.0000 ug | PREFILLED_SYRINGE | INTRAMUSCULAR | Status: DC | PRN
  Administered 2024-10-15 (×2): 50 ug via INTRAVENOUS

## 2024-10-15 MED ORDER — ROCURONIUM BROMIDE 10 MG/ML (PF) SYRINGE
PREFILLED_SYRINGE | INTRAVENOUS | Status: DC | PRN
  Administered 2024-10-15: 70 mg via INTRAVENOUS
  Administered 2024-10-15: 20 mg via INTRAVENOUS

## 2024-10-15 MED ORDER — SUGAMMADEX SODIUM 200 MG/2ML IV SOLN
INTRAVENOUS | Status: DC | PRN
  Administered 2024-10-15: 200 mg via INTRAVENOUS

## 2024-10-15 MED ORDER — CEFAZOLIN SODIUM-DEXTROSE 2-4 GM/100ML-% IV SOLN
2.0000 g | INTRAVENOUS | Status: AC
  Administered 2024-10-15: 2 g via INTRAVENOUS
  Filled 2024-10-15: qty 100

## 2024-10-15 MED ORDER — SUGAMMADEX SODIUM 200 MG/2ML IV SOLN
INTRAVENOUS | Status: AC
  Filled 2024-10-15: qty 2

## 2024-10-15 MED ORDER — OXYCODONE HCL 5 MG PO TABS
ORAL_TABLET | ORAL | Status: AC
  Filled 2024-10-15: qty 1

## 2024-10-15 MED ORDER — LIDOCAINE HCL (PF) 2 % IJ SOLN
INTRAMUSCULAR | Status: AC
  Filled 2024-10-15: qty 5

## 2024-10-15 MED ORDER — LIDOCAINE HCL (CARDIAC) PF 100 MG/5ML IV SOSY
PREFILLED_SYRINGE | INTRAVENOUS | Status: DC | PRN
  Administered 2024-10-15: 60 mg via INTRATRACHEAL

## 2024-10-15 MED ORDER — DEXAMETHASONE SOD PHOSPHATE PF 10 MG/ML IJ SOLN
INTRAMUSCULAR | Status: DC | PRN
  Administered 2024-10-15: 5 mg via INTRAVENOUS

## 2024-10-15 MED ORDER — OXYCODONE HCL 5 MG PO TABS
5.0000 mg | ORAL_TABLET | Freq: Once | ORAL | Status: AC | PRN
  Administered 2024-10-15: 5 mg via ORAL

## 2024-10-15 MED ORDER — TRANEXAMIC ACID-NACL 1000-0.7 MG/100ML-% IV SOLN
INTRAVENOUS | Status: AC
  Filled 2024-10-15: qty 100

## 2024-10-15 MED ORDER — OXYCODONE HCL 5 MG/5ML PO SOLN: 5.0000 mg | Freq: Once | ORAL | Status: AC | PRN

## 2024-10-15 MED ORDER — TRAMADOL HCL 50 MG PO TABS: 50.0000 mg | ORAL_TABLET | Freq: Four times a day (QID) | ORAL | 0 refills | Status: AC | PRN

## 2024-10-15 MED ORDER — ORAL CARE MOUTH RINSE: 15.0000 mL | Freq: Once | OROMUCOSAL | Status: AC

## 2024-10-15 MED ORDER — ACETAMINOPHEN 10 MG/ML IV SOLN
INTRAVENOUS | Status: AC
  Filled 2024-10-15: qty 100

## 2024-10-15 MED ORDER — TRANEXAMIC ACID-NACL 1000-0.7 MG/100ML-% IV SOLN
INTRAVENOUS | Status: DC | PRN
  Administered 2024-10-15: 1000 mg via INTRAVENOUS

## 2024-10-15 MED ORDER — PROPOFOL 10 MG/ML IV BOLUS
INTRAVENOUS | Status: DC | PRN
  Administered 2024-10-15: 160 mg via INTRAVENOUS
  Administered 2024-10-15: 40 mg via INTRAVENOUS

## 2024-10-15 MED ORDER — ONDANSETRON HCL 4 MG/2ML IJ SOLN
INTRAMUSCULAR | Status: DC | PRN
  Administered 2024-10-15: 4 mg via INTRAVENOUS

## 2024-10-15 MED ORDER — 0.9 % SODIUM CHLORIDE (POUR BTL) OPTIME
TOPICAL | Status: DC | PRN
  Administered 2024-10-15: 1000 mL

## 2024-10-15 MED ORDER — CHLORHEXIDINE GLUCONATE 0.12 % MT SOLN
15.0000 mL | Freq: Once | OROMUCOSAL | Status: AC
  Administered 2024-10-15: 15 mL via OROMUCOSAL

## 2024-10-15 MED ORDER — FENTANYL CITRATE (PF) 100 MCG/2ML IJ SOLN
INTRAMUSCULAR | Status: DC | PRN
  Administered 2024-10-15 (×2): 50 ug via INTRAVENOUS
  Administered 2024-10-15: 100 ug via INTRAVENOUS

## 2024-10-15 NOTE — Discharge Instructions (Signed)
 Post Aquaablation  Your recent prostate surgery requires very special post hospital care. Despite the fact that no skin incisions were used the area around the prostate incision is quite raw and is covered with a scab to promote healing and prevent bleeding. Certain cautions are needed to assure that the scab is not disturbed of the next 2-3 weeks while the healing proceeds.  Because the raw surface in your prostate and the irritating effects of urine you may expect frequency of urination and/or urgency (a stronger desire to urinate) and perhaps even getting up at night more often. This will usually resolve or improve slowly over the healing period. You may see some blood in your urine over the first 6 weeks. Do not be alarmed, even if the urine was clear for a while. Get off your feet and drink lots of fluids until clearing occurs. If you start to pass clots or don't improve call us .  Catheter: (If you are discharged with a catheter.) 1. Keep your catheter secured to your leg at all times with tape or the supplied strap. 2. You may experience leakage of urine around your catheter- as long as the  catheter continues to drain, this is normal.  If your catheter stops draining  go to the ER. 3. You may also have blood in your urine, even after it has been clear for  several days; you may even pass some small blood clots or other material.  This  is normal as well.  If this happens, sit down and drink plenty of water to help  make urine to flush out your bladder.  If the blood in your urine becomes worse  after doing this, contact our office or return to the ER. 4. You may use the leg bag (small bag) during the day, but use the large bag at  night.  Diet:  You may return to your normal diet immediately. Because of the raw surface of your bladder, alcohol, spicy foods, foods high in acid and drinks with caffeine may cause irritation or frequency and should be used in moderation. To keep your urine  flowing freely and avoid constipation, drink plenty of fluids during the day (8-10 glasses). Tip: Avoid cranberry juice because it is very acidic.  Activity:  Your physical activity doesn't need to be restricted. However, if you are very active, you may see some blood in the urine. We suggest that you reduce your activity under the circumstances until the bleeding has stopped.  Bowels:  It is important to keep your bowels regular during the postoperative period. Straining with bowel movements can cause bleeding. A bowel movement every other day is reasonable. Use a mild laxative if needed, such as milk of magnesia 2-3 tablespoons, or 2 Dulcolax tablets. Call if you continue to have problems. If you had been taking narcotics for pain, before, during or after your surgery, you may be constipated. Take a laxative if necessary.  Medication:  You should resume your pre-surgery medications unless told not to. DO NOT RESUME YOUR ASPIRIN, WARFARIN, OR OTHER BLOOD THINNER FOR 1 WEEK. In addition you may be given an antibiotic to prevent or treat infection. Antibiotics are not always necessary. All medication should be taken as prescribed until the bottles are finished unless you are having an unusual reaction to one of the drugs.     Problems you should report to us :  a. Fever greater than 101F. b. Heavy bleeding, or clots (see notes above about blood in urine).  c. Inability to urinate. d. Drug reactions (hives, rash, nausea, vomiting, diarrhea). e. Severe burning or pain with urination that is not improving.

## 2024-10-15 NOTE — Op Note (Signed)
 Preoperative diagnosis: BPH with bladder outlet obstruction  Postoperative diagnosis: BPH with bladder outlet obstruction  Procedure: Robotic water jet ablation of the prostate   Surgeon: Kristofor Michalowski  Anesthesia: General   Indication for procedure:   Findings:  EUA smooth non-tender no nodules 2. Cystoscopy revealed trilobar prostatic hypertrophy, bilateral orthotopic ureteral orifices.  Description of procedure:  After risks and benefits were discussed with the patient, informed consent was obtained. He was brought to the operating room and placed supine on the operating table.  After adequate anesthesia he was placed lithotomy position. Timeout was performed to confirm the patient and procedure.   The TRUS Stepper was mounted to the Articulating Arm and secured to OR bed. The ultrasound probe was attached to the stepper. Exam under anesthesia was performed and the TRUS was inserted per rectum.  There was no resistance. The ultrasound probe was aligned, and confirmation made that the prostate is centered and aligned using both transverse and sagittal views. The bladder neck, verumontanum and the central/transition zones were identified.  Genitalia were prepped and draped in the usual sterile fashion. The 70F AQUABEAM Handpiece is inserted into the prostatic urethra and a complete cystoscopic evaluation was performed by inspecting the prostate, bladder, and identifying the location of the verumontanum/external sphincter. The AQUABEAM Handpiece was secured to the Handpiece Articulating Arm. Confirmed alignment of AQUABEAM Handpiece and TRUS Probe to be parallel and colinear. Confirmation that AQUABEAM nozzle is centered and anterior of the bladder neck or the median lobe. The cystoscope was then retracted to visualize the verumontanum and external sphincter and the cystoscope tip was positioned just proximal to the external sphincter. Reconfirmed alignment of the TRUS probe with the AQUABEAM Handpiece  and compression applied with TRUS probe. Horizontal alignment of the Handpiece waterjet nozzle was performed. The Aquablation treatment zones were planned utilizing real-time TRUS to visualize the contour of the prostate and the depth and radial angles of resection were defined in the transverse view. In the sagittal view, the AQUABEAM nozzle is identified and position registered with software. The treatment contours were then adjusted to conform to the intended resection margins. The median lobe, bladder neck and verumontanum were marked and confirmed in the treatment contour. The Aquablation Treatment was then started following the resection contour confirmed under ultrasound guidance. TOTAL AQUABLATION RESECTION TIME: 3:58, 3:08 = 7:06 min Once Aquablation resection was complete the 24 French aqua beam handpiece was carefully removed.  The continuous-flow sheath with the visual obturator was passed and then the loop and handle.  The trigone and the ureteral orifices were identified.  Resection of some of the residual median lobe and bladder neck tissue was done.  The bladder neck was identified at 6:00 and this was taken up to 12:00 with fulguration of the bladder neck and prostate for hemostasis.  Slight amount of anterior tissue was resected.  Similarly from 6:00 up to 12:00 on the left side of the bladder neck was identified by resecting some of the ablated tissue to identify the bladder neck and cauterize any bleeding.  Some anterior tissue on the left was resected.  This created excellent hemostasis.  All the chips were evacuated.  Ureteral orifices again identified and noted to be normal without injury.  The scope was backed out and a 24 French hematuria catheter was placed with 30 cc in the balloon.  The balloon was seated at the bladder neck and it was irrigated on light traction and noted to be clear to pink.  He was  hooked up to CBI.  He was cleaned up and placed supine.  Catheter was placed on  traction.  He was awakened and taken to the cover room in stable condition.  Complications: None  Blood loss: 400 mL  Specimens: None  Drains: 24 French three-way hematuria catheter with 30 cc in the balloon  Disposition: Patient stable to PACU

## 2024-10-15 NOTE — Transfer of Care (Signed)
 Immediate Anesthesia Transfer of Care Note  Patient: Gregory Mckay  Procedure(s) Performed: ABLATION, PROSTATE, TRANSURETHRAL, USING WATERJET (Prostate)  Patient Location: PACU  Anesthesia Type:General  Level of Consciousness: sedated  Airway & Oxygen Therapy: Patient Spontanous Breathing and Patient connected to face mask oxygen  Post-op Assessment: Report given to RN and Post -op Vital signs reviewed and stable  Post vital signs: Reviewed and stable  Last Vitals:  Vitals Value Taken Time  BP    Temp    Pulse 79 10/15/24 12:27  Resp 12 10/15/24 12:27  SpO2 95 % 10/15/24 12:27  Vitals shown include unfiled device data.  Last Pain:  Vitals:   10/15/24 0837  TempSrc:   PainSc: 0-No pain         Complications: No notable events documented.

## 2024-10-15 NOTE — Anesthesia Procedure Notes (Signed)
 Procedure Name: Intubation Date/Time: 10/15/2024 10:54 AM  Performed by: Franchot Delon RAMAN, CRNAPre-anesthesia Checklist: Patient identified, Emergency Drugs available, Suction available and Patient being monitored Patient Re-evaluated:Patient Re-evaluated prior to induction Oxygen Delivery Method: Circle System Utilized Preoxygenation: Pre-oxygenation with 100% oxygen Induction Type: IV induction Ventilation: Oral airway inserted - appropriate to patient size Laryngoscope Size: Glidescope and 4 Grade View: Grade I Tube type: Oral Tube size: 7.5 mm Number of attempts: 2 Airway Equipment and Method: Stylet, Video-laryngoscopy and Oral airway Placement Confirmation: ETT inserted through vocal cords under direct vision, positive ETCO2 and breath sounds checked- equal and bilateral Secured at: 22 cm Tube secured with: Tape Dental Injury: Bloody posterior oropharynx  Comments: Intubation attempt by CRNA x1 w Mil 2, Gr 3 view- unable to pass bougie stylet. Bloody posterior orophayrx suctioned. Glyde Scope intubation with Gr 1 view.

## 2024-10-15 NOTE — Interval H&P Note (Signed)
 History and Physical Interval Note:  10/15/2024 9:30 AM  Gregory Mckay  has presented today for surgery, with the diagnosis of BENIGN PROSTATIC HYPERPLASIA WITH LOWER URINARY TRACT SYMPTOMS.  The various methods of treatment have been discussed with the patient and family. After consideration of risks, benefits and other options for treatment, the patient has consented to  Procedures with comments: ABLATION, PROSTATE, TRANSURETHRAL, USING WATERJET (N/A) - AQUABLATION as a surgical intervention.  The patient's history has been reviewed, patient examined, no change in status, stable for surgery.  I have reviewed the patient's chart and labs.  Questions were answered to the patient's satisfaction.     Junetta Hearn D Danyal Adorno

## 2024-10-15 NOTE — Treatment Plan (Signed)
 Patient's urine light pink on traction with slow drip CBI.   Likely dc pending urine color after clamp and off traction. Plan for TOV on Friday

## 2024-10-17 NOTE — Anesthesia Postprocedure Evaluation (Signed)
"   Anesthesia Post Note  Patient: Gregory Mckay  Procedure(s) Performed: ABLATION, PROSTATE, TRANSURETHRAL, USING WATERJET (Prostate)     Patient location during evaluation: PACU Anesthesia Type: General Level of consciousness: awake and alert Pain management: pain level controlled Vital Signs Assessment: post-procedure vital signs reviewed and stable Respiratory status: spontaneous breathing, nonlabored ventilation and respiratory function stable Cardiovascular status: blood pressure returned to baseline and stable Postop Assessment: no apparent nausea or vomiting Anesthetic complications: no   No notable events documented.                Darcus Edds      "

## 2024-12-31 ENCOUNTER — Ambulatory Visit

## 2025-01-13 ENCOUNTER — Ambulatory Visit: Admitting: Dermatology

## 2025-02-18 ENCOUNTER — Encounter: Admitting: Family Medicine

## 2025-04-18 ENCOUNTER — Encounter
